# Patient Record
Sex: Female | Born: 1935
Health system: Southern US, Community
[De-identification: ages and names within clinical notes are randomized; demographics above are authoritative.]

## PROBLEM LIST (undated history)

## (undated) DIAGNOSIS — M810 Age-related osteoporosis without current pathological fracture: Secondary | ICD-10-CM

## (undated) DIAGNOSIS — K649 Unspecified hemorrhoids: Secondary | ICD-10-CM

## (undated) DIAGNOSIS — J449 Chronic obstructive pulmonary disease, unspecified: Secondary | ICD-10-CM

## (undated) DIAGNOSIS — M1990 Unspecified osteoarthritis, unspecified site: Secondary | ICD-10-CM

## (undated) DIAGNOSIS — Q782 Osteopetrosis: Secondary | ICD-10-CM

## (undated) DIAGNOSIS — C649 Malignant neoplasm of unspecified kidney, except renal pelvis: Secondary | ICD-10-CM

## (undated) DIAGNOSIS — B999 Unspecified infectious disease: Secondary | ICD-10-CM

## (undated) DIAGNOSIS — I10 Essential (primary) hypertension: Secondary | ICD-10-CM

## (undated) DIAGNOSIS — Z905 Acquired absence of kidney: Secondary | ICD-10-CM

## (undated) DIAGNOSIS — M4846XA Fatigue fracture of vertebra, lumbar region, initial encounter for fracture: Secondary | ICD-10-CM

## (undated) HISTORY — PX: EYE SURGERY: SHX253

## (undated) HISTORY — DX: Unspecified hemorrhoids: K64.9

## (undated) HISTORY — PX: ABDOMINAL HYSTERECTOMY: SHX81

## (undated) HISTORY — DX: Essential (primary) hypertension: I10

## (undated) HISTORY — PX: ABDOMINAL SURGERY: SHX537

## (undated) HISTORY — PX: INSERTION, VENA CAVA FILTER: SHX4416

---

## 2004-10-08 ENCOUNTER — Ambulatory Visit: Payer: Self-pay | Admitting: Gastroenterology

## 2004-10-23 ENCOUNTER — Ambulatory Visit: Payer: Self-pay | Admitting: Gastroenterology

## 2006-11-19 ENCOUNTER — Encounter: Admission: RE | Admit: 2006-11-19 | Discharge: 2006-11-19 | Payer: Self-pay | Admitting: Internal Medicine

## 2010-06-23 HISTORY — PX: ABDOMINAL SURGERY: SHX537

## 2010-06-23 HISTORY — PX: NEPHRECTOMY: SHX65

## 2010-08-02 ENCOUNTER — Other Ambulatory Visit: Payer: Self-pay | Admitting: Orthopaedic Surgery

## 2010-08-02 DIAGNOSIS — M545 Low back pain: Secondary | ICD-10-CM

## 2010-08-05 ENCOUNTER — Ambulatory Visit
Admission: RE | Admit: 2010-08-05 | Discharge: 2010-08-05 | Disposition: A | Discharge status: Home / Self Care | Payer: MEDICARE | Source: Ambulatory Visit | Attending: Orthopaedic Surgery | Admitting: Orthopaedic Surgery

## 2010-08-05 DIAGNOSIS — M545 Low back pain: Secondary | ICD-10-CM

## 2010-08-07 ENCOUNTER — Ambulatory Visit
Admission: RE | Admit: 2010-08-07 | Discharge: 2010-08-07 | Disposition: A | Discharge status: Home / Self Care | Payer: MEDICARE | Source: Ambulatory Visit | Attending: Orthopaedic Surgery | Admitting: Orthopaedic Surgery

## 2010-08-07 ENCOUNTER — Other Ambulatory Visit: Payer: Self-pay | Admitting: Orthopaedic Surgery

## 2010-08-07 DIAGNOSIS — N2881 Hypertrophy of kidney: Secondary | ICD-10-CM

## 2010-08-07 MED ORDER — IOHEXOL 300 MG/ML  SOLN
100.0000 mL | Freq: Once | INTRAMUSCULAR | Status: AC | PRN
  Administered 2010-08-07: 100 mL via INTRAVENOUS

## 2010-09-22 DIAGNOSIS — C649 Malignant neoplasm of unspecified kidney, except renal pelvis: Secondary | ICD-10-CM

## 2010-09-22 HISTORY — DX: Malignant neoplasm of unspecified kidney, except renal pelvis: C64.9

## 2010-12-16 ENCOUNTER — Other Ambulatory Visit: Payer: Self-pay | Admitting: Urology

## 2010-12-16 DIAGNOSIS — Z85528 Personal history of other malignant neoplasm of kidney: Secondary | ICD-10-CM

## 2010-12-17 ENCOUNTER — Ambulatory Visit
Admission: RE | Admit: 2010-12-17 | Discharge: 2010-12-17 | Disposition: A | Discharge status: Home / Self Care | Payer: Medicare Other | Source: Ambulatory Visit | Attending: Urology | Admitting: Urology

## 2010-12-17 DIAGNOSIS — Z85528 Personal history of other malignant neoplasm of kidney: Secondary | ICD-10-CM

## 2010-12-17 MED ORDER — IOHEXOL 300 MG/ML  SOLN
100.0000 mL | Freq: Once | INTRAMUSCULAR | Status: AC | PRN
  Administered 2010-12-17: 100 mL via INTRAVENOUS

## 2011-02-03 ENCOUNTER — Other Ambulatory Visit: Payer: Self-pay | Admitting: Urology

## 2011-02-03 DIAGNOSIS — Z8051 Family history of malignant neoplasm of kidney: Secondary | ICD-10-CM

## 2011-02-12 ENCOUNTER — Other Ambulatory Visit: Payer: Self-pay | Admitting: Urology

## 2011-02-12 DIAGNOSIS — Z8051 Family history of malignant neoplasm of kidney: Secondary | ICD-10-CM

## 2011-05-21 ENCOUNTER — Emergency Department (HOSPITAL_COMMUNITY): Payer: Medicare Other

## 2011-05-21 ENCOUNTER — Encounter (HOSPITAL_COMMUNITY): Payer: Self-pay | Admitting: Emergency Medicine

## 2011-05-21 ENCOUNTER — Emergency Department (HOSPITAL_COMMUNITY)
Admission: EM | Admit: 2011-05-21 | Discharge: 2011-05-21 | Disposition: A | Discharge status: Home / Self Care | Payer: Medicare Other | Attending: Emergency Medicine | Admitting: Emergency Medicine

## 2011-05-21 DIAGNOSIS — R109 Unspecified abdominal pain: Secondary | ICD-10-CM | POA: Insufficient documentation

## 2011-05-21 DIAGNOSIS — M810 Age-related osteoporosis without current pathological fracture: Secondary | ICD-10-CM | POA: Insufficient documentation

## 2011-05-21 DIAGNOSIS — R110 Nausea: Secondary | ICD-10-CM | POA: Insufficient documentation

## 2011-05-21 DIAGNOSIS — Z8553 Personal history of malignant neoplasm of renal pelvis: Secondary | ICD-10-CM | POA: Insufficient documentation

## 2011-05-21 DIAGNOSIS — R197 Diarrhea, unspecified: Secondary | ICD-10-CM | POA: Insufficient documentation

## 2011-05-21 HISTORY — DX: Acquired absence of kidney: Z90.5

## 2011-05-21 HISTORY — DX: Osteopetrosis: Q78.2

## 2011-05-21 HISTORY — DX: Malignant neoplasm of unspecified kidney, except renal pelvis: C64.9

## 2011-05-21 LAB — POCT I-STAT, CHEM 8
BUN: 10 mg/dL (ref 6–23)
Calcium, Ion: 1.13 mmol/L (ref 1.12–1.32)
Creatinine, Ser: 0.9 mg/dL (ref 0.50–1.10)
Glucose, Bld: 92 mg/dL (ref 70–99)
TCO2: 23 mmol/L (ref 0–100)

## 2011-05-21 MED ORDER — ONDANSETRON HCL 4 MG/2ML IJ SOLN
4.0000 mg | Freq: Once | INTRAMUSCULAR | Status: AC
  Administered 2011-05-21: 4 mg via INTRAVENOUS

## 2011-05-21 MED ORDER — IOHEXOL 300 MG/ML  SOLN
100.0000 mL | Freq: Once | INTRAMUSCULAR | Status: AC | PRN
  Administered 2011-05-21: 100 mL via INTRAVENOUS

## 2011-05-21 MED ORDER — ONDANSETRON HCL 4 MG/2ML IJ SOLN
INTRAMUSCULAR | Status: AC
  Filled 2011-05-21: qty 2

## 2011-05-21 MED ORDER — SODIUM CHLORIDE 0.9 % IV BOLUS (SEPSIS)
1000.0000 mL | Freq: Once | INTRAVENOUS | Status: AC
  Administered 2011-05-21: 1000 mL via INTRAVENOUS

## 2011-05-21 NOTE — ED Notes (Signed)
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

## 2011-05-21 NOTE — ED Provider Notes (Signed)
Pt CT scan did not show any acute process. IT did show a 1.1cm lymph node amongst some other enlarged lymph nodes. Pt has a history of renal cell carcinoma. I have made pt aware or these findings and she has agreed to call her doctor, DR. Evans who is at [facility] for follow up of these findings. Pt is comfortable and agreeable  to be discharged. Has not had any pain since in ED or vomiting  Dorthula Matas, PA 05/21/11 1932  Dorthula Matas, PA 06/14/11 [phone]

## 2011-05-21 NOTE — ED Notes (Signed)
Pt resting comfortably talking on the phone. Ct has been called to check on delay in getting pt scan done.they advise she is in the box awaiting transport. Pt is aware and has been updated

## 2011-05-21 NOTE — ED Provider Notes (Signed)
History     CSN: [id number] Arrival date & time: 05/21/2011 12:17 PM   First MD Initiated Contact with Patient 05/21/11 1234      Chief Complaint  Patient presents with  . Abdominal Pain     Patient is a 77 y.o. female presenting with abdominal pain. The history is provided by the patient.  Abdominal Pain The primary symptoms of the illness include abdominal pain, nausea and diarrhea. The primary symptoms of the illness do not include fever. The current episode started more than 2 days ago. The onset of the illness was gradual. The problem has been gradually worsening.  Symptoms associated with the illness do not include back pain.  she has no CP at this time  Past Medical History  Diagnosis Date  . Cancer   . Osteopetrosis     Past Surgical History  Procedure Date  . Abdominal surgery     History reviewed. No pertinent family history.  History  Substance Use Topics  . Smoking status: Former Games developer  . Smokeless tobacco: Not on file  . Alcohol Use: 4.2 oz/week    7 Glasses of wine per week    OB History    Grav Para Term Preterm Abortions TAB SAB Ect Mult Living                  Review of Systems  Constitutional: Negative for fever.  Gastrointestinal: Positive for nausea, abdominal pain and diarrhea.  Musculoskeletal: Negative for back pain.  All other systems reviewed and are negative.    Allergies  Review of patient's allergies indicates no known allergies.  Home Medications   Current Outpatient Rx  Name Route Sig Dispense Refill  . ASPIRIN-ACETAMINOPHEN-CAFFEINE 250-250-65 MG PO TABS Oral Take 2 tablets by mouth daily as needed. For headache     . FISH OIL BURP-LESS 500 MG PO CAPS Oral Take 1,500 mg by mouth daily.      Marland Kitchen VITAMIN D 1000 UNITS PO TABS Oral Take 1,000 Units by mouth daily.        BP 156/73  Pulse 67  Temp(Src) 97.6 F (36.4 C) (Oral)  Resp 16  SpO2 100%  Physical Exam  CONSTITUTIONAL: Well developed/well nourished HEAD AND  FACE: Normocephalic/atraumatic EYES: EOMI/PERRL ENMT: Mucous membranes moist NECK: supple no meningeal signs SPINE:entire spine nontender CV: S1/S2 noted, no murmurs/rubs/gallops noted LUNGS: Lungs are clear to auscultation bilaterally, no apparent distress ABDOMEN: soft, diffuse tenderness but no rebound/guarding, +Bs noted, midline scar noted GU:no cva tenderness NEURO: Pt is awake/alert, moves all extremitiesx4 EXTREMITIES: pulses normal, full ROM SKIN: warm, color normal PSYCH: no abnormalities of mood noted   ED Course  Procedures   Labs Reviewed  I-STAT, CHEM 8    1:55 PM Pt with abd pain, h/o renal cell CA Will get CT abd/pelvis Pt otherwise stable . If negative can be discharged D/w PA [place] and Neva Seat, will place in CDU hold She will get CT chest as outpatient for CA surveillance She does not want EKG at this time   MDM  Nursing notes reviewed and considered in documentation All labs/vitals reviewed and considered         Joya Gaskins, MD 05/21/11 1655

## 2011-05-21 NOTE — ED Notes (Signed)
NAD, resp e/u, pt states understanding of discharge instructions and denies questions, pt ambulatory with steady gait at time of discharge.

## 2011-06-02 ENCOUNTER — Other Ambulatory Visit: Payer: Medicare Other

## 2011-06-19 NOTE — ED Provider Notes (Signed)
Medical screening examination/treatment/procedure(s) were conducted as a shared visit with non-physician practitioner(s) and myself.  I personally evaluated the patient during the encounter   Joya Gaskins, MD 06/19/11 1258

## 2011-09-24 ENCOUNTER — Other Ambulatory Visit (HOSPITAL_COMMUNITY): Payer: Self-pay | Admitting: Urology

## 2011-09-29 ENCOUNTER — Encounter (HOSPITAL_COMMUNITY): Payer: Self-pay | Admitting: Pharmacy Technician

## 2011-09-30 ENCOUNTER — Other Ambulatory Visit: Payer: Self-pay | Admitting: Radiology

## 2011-10-03 ENCOUNTER — Other Ambulatory Visit: Payer: Self-pay | Admitting: Radiology

## 2011-10-09 ENCOUNTER — Encounter (HOSPITAL_COMMUNITY): Payer: Self-pay

## 2011-10-09 ENCOUNTER — Ambulatory Visit (HOSPITAL_COMMUNITY)
Admission: RE | Admit: 2011-10-09 | Discharge: 2011-10-09 | Disposition: A | Discharge status: Home / Self Care | Payer: Medicare Other | Source: Ambulatory Visit | Attending: Urology | Admitting: Urology

## 2011-10-09 ENCOUNTER — Other Ambulatory Visit (HOSPITAL_COMMUNITY): Payer: Self-pay | Admitting: Urology

## 2011-10-09 DIAGNOSIS — Z538 Procedure and treatment not carried out for other reasons: Secondary | ICD-10-CM | POA: Insufficient documentation

## 2011-10-09 DIAGNOSIS — R590 Localized enlarged lymph nodes: Secondary | ICD-10-CM

## 2011-10-09 DIAGNOSIS — R1900 Intra-abdominal and pelvic swelling, mass and lump, unspecified site: Secondary | ICD-10-CM | POA: Insufficient documentation

## 2011-10-09 LAB — CBC
MCHC: 36.4 g/dL — ABNORMAL HIGH (ref 30.0–36.0)
RBC: 4.86 MIL/uL (ref 3.87–5.11)
RDW: 12.7 % (ref 11.5–15.5)
WBC: 5.9 10*3/uL (ref 4.0–10.5)

## 2011-10-09 LAB — PROTIME-INR
INR: 1.02 (ref 0.00–1.49)
Prothrombin Time: 13.6 seconds (ref 11.6–15.2)

## 2011-10-09 MED ORDER — SODIUM CHLORIDE 0.9 % IV SOLN
Freq: Once | INTRAVENOUS | Status: AC
  Administered 2011-10-09: 1000 mL via INTRAVENOUS

## 2011-10-09 MED ORDER — MIDAZOLAM HCL 2 MG/2ML IJ SOLN
INTRAMUSCULAR | Status: AC
  Filled 2011-10-09: qty 4

## 2011-10-09 MED ORDER — FENTANYL CITRATE 0.05 MG/ML IJ SOLN
INTRAMUSCULAR | Status: AC
  Filled 2011-10-09: qty 4

## 2011-10-09 MED ORDER — IOHEXOL 300 MG/ML  SOLN: 80.0000 mL | Freq: Once | INTRAMUSCULAR | Status: AC | PRN

## 2011-10-09 NOTE — H&P (Signed)
Nicole Preston is an 78 y.o. female.   Chief Complaint: Mesenteric/retroperitoneal lymphadenopathy. Hx of renal cell cancer  HPI: Pt with prior hx of renal cell cancer and (R)nephrectomy. Has been followed for recurrent intra-abd/retroperitoneal lymphadeopathy by Dr. Logan Bores. Last CT was November but LN too small for biopsy. Pt now scheduled today for repeat CT and possible biopsy if amenable.  Past Medical History  Diagnosis Date  . Cancer   . Osteopetrosis   . Renal cancer   . S/p nephrectomy     Past Surgical History  Procedure Date  . (R)nephrectomy     History reviewed. No pertinent family history. Social History:  reports that she has quit smoking. She does not have any smokeless tobacco history on file. She reports that she drinks about 4.2 ounces of alcohol per week. She reports that she does not use illicit drugs.  Allergies: No Known Allergies  Medications Prior to Admission  Medication Sig Dispense Refill  . cholecalciferol (VITAMIN D) 1000 UNITS tablet Take 1,000 Units by mouth daily.        Marland Kitchen losartan-hydrochlorothiazide (HYZAAR) 100-12.5 MG per tablet Take 1 tablet by mouth at bedtime.      . Omega-3 Fatty Acids (FISH OIL BURP-LESS) 500 MG CAPS Take 1,500 mg by mouth daily.        Marland Kitchen aspirin-acetaminophen-caffeine (EXCEDRIN MIGRAINE) 250-250-65 MG per tablet Take 2 tablets by mouth daily as needed. For headache        Medications Prior to Admission  Medication Dose Route Frequency Provider Last Rate Last Dose  . 0.9 %  sodium chloride infusion   Intravenous Once Robet Leu, PA 20 mL/hr at 10/09/11 0813 1,000 mL at 10/09/11 0813    No results found for this or any previous visit (from the past 48 hour(s)). No results found.  Review of Systems  Constitutional: Positive for malaise/fatigue. Negative for fever, chills and weight loss.  Respiratory: Negative for cough, hemoptysis and shortness of breath.   Cardiovascular: Negative for chest pain, palpitations and  claudication.  Gastrointestinal: Negative for nausea, vomiting, abdominal pain, diarrhea and constipation.  Genitourinary: Negative for dysuria, urgency and frequency.    Blood pressure 157/78, pulse 73, temperature 97 F (36.1 C), temperature source Oral, resp. rate 18, height 5\' 5"  (1.651 m), weight 138 lb (62.596 kg), SpO2 97.00%. Physical Exam  Constitutional: She is oriented to person, place, and time. She appears well-developed and well-nourished. No distress.  HENT:  Head: Normocephalic.  Neck: Normal range of motion. Neck supple. No JVD present. No tracheal deviation present.  Cardiovascular: Normal rate, regular rhythm and normal heart sounds.   No murmur heard. Respiratory: Effort normal and breath sounds normal. No respiratory distress. She has no wheezes.  GI: Soft. Bowel sounds are normal. She exhibits no distension. There is no tenderness.  Neurological: She is alert and oriented to person, place, and time.  Psychiatric: She has a normal mood and affect. Judgment normal.     Assessment/Plan Hx of renal cell cancer Mesenteric/retroperitonela lymphadenopathy. For CT today with possible biopsy if there is a LN accessible. Procedure, including risks, complications explained to pt and husband. Consent signed in chart.  Allayne Butcher 10/09/2011, 8:33 AM

## 2011-10-13 ENCOUNTER — Other Ambulatory Visit: Payer: Self-pay | Admitting: Urology

## 2011-10-13 DIAGNOSIS — Z8051 Family history of malignant neoplasm of kidney: Secondary | ICD-10-CM

## 2011-11-06 ENCOUNTER — Encounter: Payer: Self-pay | Admitting: Gastroenterology

## 2011-12-09 ENCOUNTER — Ambulatory Visit (INDEPENDENT_AMBULATORY_CARE_PROVIDER_SITE_OTHER): Payer: Medicare Other | Admitting: Gastroenterology

## 2011-12-09 ENCOUNTER — Encounter: Payer: Self-pay | Admitting: Gastroenterology

## 2011-12-09 VITALS — BP 138/64 | HR 60 | Ht 65.0 in | Wt 141.0 lb

## 2011-12-09 DIAGNOSIS — K625 Hemorrhage of anus and rectum: Secondary | ICD-10-CM

## 2011-12-09 DIAGNOSIS — C649 Malignant neoplasm of unspecified kidney, except renal pelvis: Secondary | ICD-10-CM

## 2011-12-09 MED ORDER — MOVIPREP 100 G PO SOLR: 1.0000 | Freq: Once | ORAL | Status: DC

## 2011-12-09 MED ORDER — HYDROCORTISONE ACETATE 25 MG RE SUPP: 25.0000 mg | Freq: Two times a day (BID) | RECTAL | Status: AC

## 2011-12-09 NOTE — Progress Notes (Signed)
History of Present Illness: Nicole Preston is a pleasant 78-year-old white female referred at the request of Dr. Tisovec for evaluation of rectal bleeding. For the last 2-3 weeks she's been complaining of frequent blood per rectum consisting of blood mixed with her stools. She has passed clots. She's had loose stools, which is her norm, and some rectal soreness. Colonoscopy in 2006 demonstrated diverticulosis and internal  Hemorrhoids.  Approximately 14 months ago she underwent nephrectomy for a carcinoma. CT scan in April, 2013 did not demonstrate recurrence.   Past Medical History  Diagnosis Date  . Osteopetrosis   . Renal cancer   . S/p nephrectomy   . Hemorrhoids   . Hypertension    Past Surgical History  Procedure Date  . Abdominal surgery   . Abdominal hysterectomy    family history includes Kidney cancer in her mother.  There is no history of Colon cancer. Current Outpatient Prescriptions  Medication Sig Dispense Refill  . aspirin-acetaminophen-caffeine (EXCEDRIN MIGRAINE) 250-250-65 MG per tablet Take 2 tablets by mouth daily as needed. For headache       . Bismuth Subsalicylate (PEPTO-BISMOL PO) Take by mouth as directed.      . cholecalciferol (VITAMIN D) 1000 UNITS tablet Take 1,000 Units by mouth daily.        . losartan-hydrochlorothiazide (HYZAAR) 100-12.5 MG per tablet Take by mouth as directed.       . Omega-3 Fatty Acids (FISH OIL BURP-LESS) 500 MG CAPS Take 1,500 mg by mouth daily.         Allergies as of 12/09/2011  . (No Known Allergies)    reports that she has quit smoking. She has never used smokeless tobacco. She reports that she drinks about 1.2 - 1.8 ounces of alcohol per week. She reports that she does not use illicit drugs.     Review of Systems:  to low back painPertinent positive and negative review of systems were noted in the above HPI section. All other review of systems were otherwise negative.  Vital signs were reviewed in today's medical  record Physical Exam: General: Well developed , well nourished, no acute distress Head: Normocephalic and atraumatic Eyes:  sclerae anicteric, EOMI Ears: Normal auditory acuity Mouth: No deformity or lesions Neck: Supple, no masses or thyromegaly Lungs: Clear throughout to auscultation Heart: Regular rate and rhythm; no murmurs, rubs or bruits Abdomen: Soft, non tender and non distended. No masses, hepatosplenomegaly or hernias noted. Normal Bowel sounds Rectal:A single grade 3 hemorrhoid is present  Musculoskeletal: Symmetrical with no gross deformities  Skin: No lesions on visible extremities Pulses:  Normal pulses noted Extremities: No clubbing, cyanosis, edema or deformities noted Neurological: Alert oriented x 4, grossly nonfocal Cervical Nodes:  No significant cervical adenopathy Inguinal Nodes: No significant inguinal adenopathy Psychological:  Alert and cooperative. Normal mood and affect       

## 2011-12-09 NOTE — Assessment & Plan Note (Addendum)
Rectal bleeding may be due to hemorrhoids. A more proximal colonic bleeding source should be ruled out.  Recommendations #1 Anusol HC suppositories 11 #2 colonoscopy #3 if bleeding persists and colonoscopy does not demonstrate a bleeding source I will proceed with band ligation

## 2011-12-10 ENCOUNTER — Telehealth: Payer: Self-pay | Admitting: Gastroenterology

## 2011-12-11 NOTE — Telephone Encounter (Signed)
Mailed patient a coupon for the MoviPrep today, Patient aware

## 2011-12-29 ENCOUNTER — Ambulatory Visit (HOSPITAL_COMMUNITY)
Admission: RE | Admit: 2011-12-29 | Discharge: 2011-12-29 | Disposition: A | Discharge status: Home / Self Care | Payer: Medicare Other | Source: Ambulatory Visit | Attending: Gastroenterology | Admitting: Gastroenterology

## 2011-12-29 ENCOUNTER — Encounter (HOSPITAL_COMMUNITY): Payer: Self-pay

## 2011-12-29 ENCOUNTER — Telehealth: Payer: Self-pay

## 2011-12-29 ENCOUNTER — Encounter (HOSPITAL_COMMUNITY)
Admission: RE | Disposition: A | Discharge status: Home / Self Care | Payer: Self-pay | Source: Ambulatory Visit | Attending: Gastroenterology

## 2011-12-29 DIAGNOSIS — Z87891 Personal history of nicotine dependence: Secondary | ICD-10-CM | POA: Insufficient documentation

## 2011-12-29 DIAGNOSIS — M810 Age-related osteoporosis without current pathological fracture: Secondary | ICD-10-CM | POA: Insufficient documentation

## 2011-12-29 DIAGNOSIS — Z905 Acquired absence of kidney: Secondary | ICD-10-CM | POA: Insufficient documentation

## 2011-12-29 DIAGNOSIS — Z5309 Procedure and treatment not carried out because of other contraindication: Secondary | ICD-10-CM | POA: Insufficient documentation

## 2011-12-29 DIAGNOSIS — Z79899 Other long term (current) drug therapy: Secondary | ICD-10-CM | POA: Insufficient documentation

## 2011-12-29 DIAGNOSIS — Z85528 Personal history of other malignant neoplasm of kidney: Secondary | ICD-10-CM | POA: Insufficient documentation

## 2011-12-29 DIAGNOSIS — I10 Essential (primary) hypertension: Secondary | ICD-10-CM | POA: Insufficient documentation

## 2011-12-29 DIAGNOSIS — K648 Other hemorrhoids: Secondary | ICD-10-CM | POA: Insufficient documentation

## 2011-12-29 DIAGNOSIS — K625 Hemorrhage of anus and rectum: Secondary | ICD-10-CM | POA: Insufficient documentation

## 2011-12-29 HISTORY — PX: COLONOSCOPY: SHX5424

## 2011-12-29 SURGERY — COLONOSCOPY
Anesthesia: Moderate Sedation

## 2011-12-29 MED ORDER — MIDAZOLAM HCL 10 MG/2ML IJ SOLN
INTRAMUSCULAR | Status: AC
  Filled 2011-12-29: qty 4

## 2011-12-29 MED ORDER — FENTANYL CITRATE 0.05 MG/ML IJ SOLN
INTRAMUSCULAR | Status: DC | PRN
  Administered 2011-12-29 (×4): 25 ug via INTRAVENOUS

## 2011-12-29 MED ORDER — DIPHENHYDRAMINE HCL 50 MG/ML IJ SOLN
INTRAMUSCULAR | Status: DC | PRN
  Administered 2011-12-29: 25 mg via INTRAVENOUS

## 2011-12-29 MED ORDER — FENTANYL CITRATE 0.05 MG/ML IJ SOLN
INTRAMUSCULAR | Status: AC
  Filled 2011-12-29: qty 4

## 2011-12-29 MED ORDER — DIPHENHYDRAMINE HCL 50 MG/ML IJ SOLN
INTRAMUSCULAR | Status: AC
  Filled 2011-12-29: qty 1

## 2011-12-29 MED ORDER — MIDAZOLAM HCL 5 MG/5ML IJ SOLN
INTRAMUSCULAR | Status: DC | PRN
  Administered 2011-12-29 (×2): 2 mg via INTRAVENOUS
  Administered 2011-12-29: 1 mg via INTRAVENOUS
  Administered 2011-12-29: 2 mg via INTRAVENOUS
  Administered 2011-12-29 (×2): 1 mg via INTRAVENOUS

## 2011-12-29 NOTE — Interval H&P Note (Signed)
History and Physical Interval Note:  12/29/2011 12:14 PM  Nicole Preston  has presented today for surgery, with the diagnosis of hemorrhoids  The various methods of treatment have been discussed with the patient and family. After consideration of risks, benefits and other options for treatment, the patient has consented to  Procedure(s) (LRB): COLONOSCOPY (N/A) HEMORRHOID BANDING (N/A) as a surgical intervention .  The patient's history has been reviewed, patient examined, no change in status, stable for surgery.  I have reviewed the patients' chart and labs.  Questions were answered to the patient's satisfaction.    The recent H&P (dated *12/09/11**) was reviewed, the patient was examined and there is no change in the patients condition since that H&P was completed.   Nicole Preston  12/29/2011, 12:14 PM    Nicole Preston

## 2011-12-29 NOTE — Telephone Encounter (Signed)
Pt scheduled for colon in the [facility] 12/30/11 arrival time 12:30 for a 1:30pm appt. Spoke with nurse taking care of her in the endo unit. She will notify pt of appt date and time. Pt to remain on clear liquids.

## 2011-12-29 NOTE — Progress Notes (Signed)
Pt's husband called to inquire about his wife's cancelled procedure today.  He informed that he was told that she could not be sedated properly for the case and he wanted to "know why the case was scheduled there in the first place".  Informed pt's husband that the decision was made by Dr. Arlyce Dice and he would need to discuss with Dr. Arlyce Dice. Pt's husband also informed that he was disappointed that his wife had to wait another day to have the procedure done and continue to be NPO until the pts scheduled colonoscopy appt at [facility] on 12/30/2011 @1230 .  At this time, he inquired if the appt could be rescheduled to earlier in the day.  Informed pt's husband that he would need to speak to someone in Dr. Marzetta Board office.  Informed pt that I would call Selinda Michaels at [facility] re: his request and ask her to f/u and give him a call.  F/U with Bonita Quin.  Bonita Quin informed there were no other available appts and that she will call pt's husband and inform.

## 2011-12-29 NOTE — H&P (View-Only) (Signed)
History of Present Illness: Nicole Preston is a pleasant 78 year old white female referred at the request of Dr. Wylene Simmer for evaluation of rectal bleeding. For the last 2-3 weeks she's been complaining of frequent blood per rectum consisting of blood mixed with her stools. She has passed clots. She's had loose stools, which is her norm, and some rectal soreness. Colonoscopy in 2006 demonstrated diverticulosis and internal  Hemorrhoids.  Approximately 14 months ago she underwent nephrectomy for a carcinoma. CT scan in April, 2013 did not demonstrate recurrence.   Past Medical History  Diagnosis Date  . Osteopetrosis   . Renal cancer   . S/p nephrectomy   . Hemorrhoids   . Hypertension    Past Surgical History  Procedure Date  . Abdominal surgery   . Abdominal hysterectomy    family history includes Kidney cancer in her mother.  There is no history of Colon cancer. Current Outpatient Prescriptions  Medication Sig Dispense Refill  . aspirin-acetaminophen-caffeine (EXCEDRIN MIGRAINE) 250-250-65 MG per tablet Take 2 tablets by mouth daily as needed. For headache       . Bismuth Subsalicylate (PEPTO-BISMOL PO) Take by mouth as directed.      . cholecalciferol (VITAMIN D) 1000 UNITS tablet Take 1,000 Units by mouth daily.        Marland Kitchen losartan-hydrochlorothiazide (HYZAAR) 100-12.5 MG per tablet Take by mouth as directed.       . Omega-3 Fatty Acids (FISH OIL BURP-LESS) 500 MG CAPS Take 1,500 mg by mouth daily.         Allergies as of 12/09/2011  . (No Known Allergies)    reports that she has quit smoking. She has never used smokeless tobacco. She reports that she drinks about 1.2 - 1.8 ounces of alcohol per week. She reports that she does not use illicit drugs.     Review of Systems:  to low back painPertinent positive and negative review of systems were noted in the above HPI section. All other review of systems were otherwise negative.  Vital signs were reviewed in today's medical  record Physical Exam: General: Well developed , well nourished, no acute distress Head: Normocephalic and atraumatic Eyes:  sclerae anicteric, EOMI Ears: Normal auditory acuity Mouth: No deformity or lesions Neck: Supple, no masses or thyromegaly Lungs: Clear throughout to auscultation Heart: Regular rate and rhythm; no murmurs, rubs or bruits Abdomen: Soft, non tender and non distended. No masses, hepatosplenomegaly or hernias noted. Normal Bowel sounds Rectal:A single grade 3 hemorrhoid is present  Musculoskeletal: Symmetrical with no gross deformities  Skin: No lesions on visible extremities Pulses:  Normal pulses noted Extremities: No clubbing, cyanosis, edema or deformities noted Neurological: Alert oriented x 4, grossly nonfocal Cervical Nodes:  No significant cervical adenopathy Inguinal Nodes: No significant inguinal adenopathy Psychological:  Alert and cooperative. Normal mood and affect

## 2011-12-29 NOTE — Op Note (Signed)
[facility] [address] [place], [place]  [address]  COLONOSCOPY PROCEDURE REPORT  PATIENT:  Nicole Preston, Nicole Preston  MR#:  [id number] BIRTHDATE:  [birth year], 78 yrs. old  GENDER:  female ENDOSCOPIST:  Barbette Hair. Arlyce Dice, MD REF. BY:  Guerry Bruin, M.D. PROCEDURE DATE:  12/29/2011 PROCEDURE:  Incomplete colonoscopy ASA CLASS:  Class II INDICATIONS:  rectal bleeding MEDICATIONS:   These medications were titrated to patient response per physician's verbal order, Fentanyl 100 mcg IV, Versed 9 mg IV, Benadryl 25 mg IV  DESCRIPTION OF PROCEDURE:   After the risks benefits and alternatives of the procedure were thoroughly explained, informed consent was obtained.  Digital rectal exam was performed and revealed no abnormalities.   The Pentax Colonoscope [id number] endoscope was introduced through the anus and advanced to the descending colon, limited by extreme patient discomfort.  Unable to obtain a colonoscopy secondary to in adequate sedation  The quality of the prep was excellent, using MiraLax.  The instrument was then slowly withdrawn as the colon was fully examined. <<PROCEDUREIMAGES>>  FINDINGS:  Internal Hemorrhoids were found (see image1).  This was otherwise a normal examination of the colon.   Retroflexed views in the rectum revealed no abnormalities.    The time to cecum = minutes. The scope was then withdrawn in  minutes from the cecum and the procedure completed. COMPLICATIONS:  None ENDOSCOPIC IMPRESSION: 1) Internal hemorrhoids 2) Otherwise normal examination RECOMMENDATIONS: Colonoscopy on 12/30/2011 with propofol  REPEAT EXAM:  As above  ______________________________ Barbette Hair. Arlyce Dice, MD  CC:  n. eSIGNED:   Barbette Hair. Kaplan at 12/29/2011 01:08 PM  Ezzard Flax, [phone]

## 2011-12-29 NOTE — Telephone Encounter (Signed)
Message copied by Chrystie Nose on Mon Dec 29, 2011  1:19 PM ------      Message from: Melvia Heaps D      Created: Mon Dec 29, 2011  1:03 PM       Unable to complete colonoscopy because of sedation issues. We have a cancellation for tomorrow. Please schedule her for colonoscopy at the [facility]. for tomorrow. Patient is currently at [facility] long endoscopy.

## 2011-12-30 ENCOUNTER — Emergency Department (HOSPITAL_COMMUNITY)
Admission: EM | Admit: 2011-12-30 | Discharge: 2011-12-31 | Disposition: A | Discharge status: Home / Self Care | Payer: Medicare Other | Attending: Emergency Medicine | Admitting: Emergency Medicine

## 2011-12-30 ENCOUNTER — Emergency Department (HOSPITAL_COMMUNITY): Payer: Medicare Other

## 2011-12-30 ENCOUNTER — Ambulatory Visit (AMBULATORY_SURGERY_CENTER): Payer: Medicare Other | Admitting: Gastroenterology

## 2011-12-30 ENCOUNTER — Encounter (HOSPITAL_COMMUNITY): Payer: Self-pay | Admitting: *Deleted

## 2011-12-30 ENCOUNTER — Encounter: Payer: Self-pay | Admitting: Gastroenterology

## 2011-12-30 VITALS — HR 70 | Temp 97.0°F | Resp 24 | Ht 65.0 in | Wt 141.0 lb

## 2011-12-30 DIAGNOSIS — R109 Unspecified abdominal pain: Secondary | ICD-10-CM

## 2011-12-30 DIAGNOSIS — N390 Urinary tract infection, site not specified: Secondary | ICD-10-CM

## 2011-12-30 DIAGNOSIS — Z9889 Other specified postprocedural states: Secondary | ICD-10-CM | POA: Insufficient documentation

## 2011-12-30 DIAGNOSIS — I700 Atherosclerosis of aorta: Secondary | ICD-10-CM | POA: Insufficient documentation

## 2011-12-30 DIAGNOSIS — K625 Hemorrhage of anus and rectum: Secondary | ICD-10-CM

## 2011-12-30 DIAGNOSIS — D126 Benign neoplasm of colon, unspecified: Secondary | ICD-10-CM

## 2011-12-30 DIAGNOSIS — E119 Type 2 diabetes mellitus without complications: Secondary | ICD-10-CM | POA: Insufficient documentation

## 2011-12-30 DIAGNOSIS — D72829 Elevated white blood cell count, unspecified: Secondary | ICD-10-CM | POA: Insufficient documentation

## 2011-12-30 DIAGNOSIS — K5730 Diverticulosis of large intestine without perforation or abscess without bleeding: Secondary | ICD-10-CM | POA: Insufficient documentation

## 2011-12-30 DIAGNOSIS — Z8553 Personal history of malignant neoplasm of renal pelvis: Secondary | ICD-10-CM | POA: Insufficient documentation

## 2011-12-30 DIAGNOSIS — E871 Hypo-osmolality and hyponatremia: Secondary | ICD-10-CM | POA: Insufficient documentation

## 2011-12-30 DIAGNOSIS — R1032 Left lower quadrant pain: Secondary | ICD-10-CM | POA: Insufficient documentation

## 2011-12-30 DIAGNOSIS — I10 Essential (primary) hypertension: Secondary | ICD-10-CM | POA: Insufficient documentation

## 2011-12-30 DIAGNOSIS — K648 Other hemorrhoids: Secondary | ICD-10-CM

## 2011-12-30 LAB — COMPREHENSIVE METABOLIC PANEL
Alkaline Phosphatase: 66 U/L (ref 39–117)
BUN: 10 mg/dL (ref 6–23)
Creatinine, Ser: 0.91 mg/dL (ref 0.50–1.10)
GFR calc Af Amer: 69 mL/min — ABNORMAL LOW (ref >90)
Glucose, Bld: 107 mg/dL — ABNORMAL HIGH (ref 70–99)
Potassium: 3.7 mEq/L (ref 3.5–5.1)
Total Protein: 7.5 g/dL (ref 6.0–8.3)

## 2011-12-30 LAB — CBC WITH DIFFERENTIAL/PLATELET
Eosinophils Absolute: 0.1 10*3/uL (ref 0.0–0.7)
Eosinophils Relative: 0 % (ref 0–5)
HCT: 37.8 % (ref 36.0–46.0)
Hemoglobin: 13.2 g/dL (ref 12.0–15.0)
Lymphs Abs: 1.1 10*3/uL (ref 0.7–4.0)
MCH: 30.6 pg (ref 26.0–34.0)
MCHC: 34.9 g/dL (ref 30.0–36.0)
MCV: 87.5 fL (ref 78.0–100.0)
Monocytes Absolute: 1 10*3/uL (ref 0.1–1.0)
Monocytes Relative: 7 % (ref 3–12)
Neutrophils Relative %: 85 % — ABNORMAL HIGH (ref 43–77)
RBC: 4.32 MIL/uL (ref 3.87–5.11)

## 2011-12-30 MED ORDER — HYDROCORTISONE ACETATE 25 MG RE SUPP: 25.0000 mg | Freq: Two times a day (BID) | RECTAL | Status: AC

## 2011-12-30 MED ORDER — ONDANSETRON HCL 4 MG/2ML IJ SOLN
4.0000 mg | Freq: Once | INTRAMUSCULAR | Status: AC
  Administered 2011-12-30: 4 mg via INTRAVENOUS
  Filled 2011-12-30: qty 2

## 2011-12-30 MED ORDER — MORPHINE SULFATE 4 MG/ML IJ SOLN
4.0000 mg | Freq: Once | INTRAMUSCULAR | Status: AC
  Administered 2011-12-30: 4 mg via INTRAVENOUS
  Filled 2011-12-30: qty 1

## 2011-12-30 MED ORDER — SODIUM CHLORIDE 0.9 % IV SOLN: 500.0000 mL | INTRAVENOUS | Status: DC

## 2011-12-30 NOTE — ED Notes (Signed)
MD at bedside.  EDP Miller present to evaluate this pt 

## 2011-12-30 NOTE — Op Note (Signed)
[facility] [address] [place], [place]  [address]  COLONOSCOPY PROCEDURE REPORT  PATIENT:  Nicole Preston, Nicole Preston  MR#:  [id number] BIRTHDATE:  Jun 13, 1934, 78 yrs. old  GENDER:  female ENDOSCOPIST:  Barbette Hair. Arlyce Dice, MD REF. BY:  Guerry Bruin, M.D. PROCEDURE DATE:  12/30/2011 PROCEDURE:  Colonoscopy with snare polypectomy ASA CLASS:  Class II INDICATIONS:  rectal bleeding MEDICATIONS:   MAC sedation, administered by CRNA propofol 300mg IV  DESCRIPTION OF PROCEDURE:   After the risks benefits and alternatives of the procedure were thoroughly explained, informed consent was obtained.  Digital rectal exam was performed and revealed prolasped hemorrhoids.   The LB [id number] endoscope was introduced through the anus and advanced to the cecum, which was identified by both the appendix and ileocecal valve, without limitations.  The quality of the prep was good, using MiraLax.  The instrument was then slowly withdrawn as the colon was fully examined. <<PROCEDUREIMAGES>>  FINDINGS:  A sessile polyp was found in the descending colon. It was 10 mm in size. Polyp was snared without cautery. Retrieval was successful (see image10). snare polyp  other finding. Acute angulation and stiffness at sigmoid curve. Scope passed with difficulty.  Internal Hemorrhoids were found (see image11).  This was otherwise a normal examination of the colon (see image2 and image3).   Retroflexed views in the rectum revealed no abnormalities.    The time to cecum =  1) 5.75  minutes. The scope was then withdrawn in  1) 11.50  minutes from the cecum and the procedure completed. COMPLICATIONS:  None ENDOSCOPIC IMPRESSION: 1) 10 mm sessile polyp in the descending colon 2) Other finding 3) Internal hemorrhoids 4) Otherwise normal examination RECOMMENDATIONS: 1) High fiber diet. 2) If the polyp(s) removed today are proven to be adenomatous (pre-cancerous) polyps, you will need a repeat colonoscopy  in 5 years. Otherwise you should continue to follow colorectal cancer screening guidelines for "routine risk" patients with colonoscopy in 10 years. You will receive a letter within 1-2 weeks with the results of your biopsy as well as final recommendations. Please call my office if you have not received a letter after 3 weeks. 3) Anusol HC supp. prn 4) Call endoscopist office to schedule a follow-up appointment in 4 weeks REPEAT EXAM:   You will receive a letter from Dr. Arlyce Dice in 1-2 weeks, after reviewing the final pathology, with followup recommendations.  ______________________________ Barbette Hair Arlyce Dice, MD  CC:  n. eSIGNED:   Barbette Hair. Rhyder Koegel at 12/30/2011 02:19 PM  Ezzard Flax, [phone]

## 2011-12-30 NOTE — Patient Instructions (Addendum)
YOU HAD AN ENDOSCOPIC PROCEDURE TODAY AT THE [facility]: Refer to the procedure report that was given to you for any specific questions about what was found during the examination.  If the procedure report does not answer your questions, please call your gastroenterologist to clarify.  If you requested that your care partner not be given the details of your procedure findings, then the procedure report has been included in a sealed envelope for you to review at your convenience later.  YOU SHOULD EXPECT: Some feelings of bloating in the abdomen. Passage of more gas than usual.  Walking can help get rid of the air that was put into your GI tract during the procedure and reduce the bloating. If you had a lower endoscopy (such as a colonoscopy or flexible sigmoidoscopy) you may notice spotting of blood in your stool or on the toilet paper. If you underwent a bowel prep for your procedure, then you may not have a normal bowel movement for a few days.  DIET: Your first meal following the procedure should be a light meal and then it is ok to progress to your normal diet.  A half-sandwich or bowl of soup is an example of a good first meal.  Heavy or fried foods are harder to digest and may make you feel nauseous or bloated.  Likewise meals heavy in dairy and vegetables can cause extra gas to form and this can also increase the bloating.  Drink plenty of fluids but you should avoid alcoholic beverages for 24 hours.  ACTIVITY: Your care partner should take you home directly after the procedure.  You should plan to take it easy, moving slowly for the rest of the day.  You can resume normal activity the day after the procedure however you should NOT DRIVE or use heavy machinery for 24 hours (because of the sedation medicines used during the test).    SYMPTOMS TO REPORT IMMEDIATELY: A gastroenterologist can be reached at any hour.  During normal business hours, 8:30 AM to 5:00 PM Monday through Friday,  call [phone].  After hours and on weekends, please call the GI answering service at [phone] who will take a message and have the physician on call contact you.   Following lower endoscopy (colonoscopy or flexible sigmoidoscopy):  Excessive amounts of blood in the stool  Significant tenderness or worsening of abdominal pains  Swelling of the abdomen that is new, acute  Fever of 100F or higher   FOLLOW UP: If any biopsies were taken you will be contacted by phone or by letter within the next 1-3 weeks.  Call your gastroenterologist if you have not heard about the biopsies in 3 weeks.  Our staff will call the home number listed on your records the next business day following your procedure to check on you and address any questions or concerns that you may have at that time regarding the information given to you following your procedure. This is a courtesy call and so if there is no answer at the home number and we have not heard from you through the emergency physician on call, we will assume that you have returned to your regular daily activities without incident.  SIGNATURES/CONFIDENTIALITY: You and/or your care partner have signed paperwork which will be entered into your electronic medical record.  These signatures attest to the fact that that the information above on your After Visit Summary has been reviewed and is understood.  Full responsibility of the confidentiality of   this discharge information lies with you and/or your care-partner.   Resume medications. Information given on polyps,hemorrhoids and high fiber diet with discharge instructions. 

## 2011-12-30 NOTE — ED Notes (Signed)
Increased pain on left abd

## 2011-12-30 NOTE — ED Provider Notes (Signed)
History     CSN: [id number]  Arrival date & time 12/30/11  2040   First MD Initiated Contact with Patient 12/30/11 2315      Chief Complaint  Patient presents with  . Abdominal Pain    (Consider location/radiation/quality/duration/timing/severity/associated sxs/prior treatment) HPI Comments: 78 year old female with a history of renal cell carcinoma status post nephrectomy, hemorrhoids, hypertension, diabetes who presents after having colonoscopy approximately 9 hours ago. She states that several hours after the colonoscopy she started to develop significant pain in her left lower quadrant radiating to the mid abdomen. This is persistent, gradually worsening, associated with fever of 101 at home. She denies nausea, vomiting, change in stools, coughing but was short of breath trying to climb stairs after being discharged from the hospital earlier today. She took 3 doses of ciprofloxacin last week for what she thought was a urinary infection but stopped taking it secondary to poor medication tolerance and side effects.  Patient is a 78 y.o. female presenting with abdominal pain. The history is provided by the patient and the spouse.  Abdominal Pain The primary symptoms of the illness include abdominal pain.    Past Medical History  Diagnosis Date  . Osteopetrosis   . Renal cancer   . S/p nephrectomy   . Hemorrhoids   . Hypertension     Past Surgical History  Procedure Date  . Abdominal surgery 2012    for renal carcinoma tumor  . Abdominal hysterectomy   . Colonoscopy 12/29/2011    Procedure: COLONOSCOPY;  Surgeon: Louis Meckel, MD;  Location: [facility];  Service: Endoscopy;  Laterality: N/A;    Family History  Problem Relation Age of Onset  . Colon cancer Neg Hx   . Kidney cancer Mother     History  Substance Use Topics  . Smoking status: Former Smoker -- 1.0 packs/day    Quit date: 12/29/1983  . Smokeless tobacco: Never Used  . Alcohol Use: 8.4 oz/week    14  Glasses of wine per week     1-2 glasses of wine/day    OB History    Grav Para Term Preterm Abortions TAB SAB Ect Mult Living                  Review of Systems  Gastrointestinal: Positive for abdominal pain.  All other systems reviewed and are negative.    Allergies  Review of patient's allergies indicates no known allergies.  Home Medications   Current Outpatient Rx  Name Route Sig Dispense Refill  . VITAMIN D 1000 UNITS PO TABS Oral Take 1,000 Units by mouth daily.      Marland Kitchen HYDROCORTISONE ACETATE 25 MG RE SUPP Rectal Place 1 suppository (25 mg total) rectally every 12 (twelve) hours. 12 suppository 1  . LOSARTAN POTASSIUM-HCTZ 100-12.5 MG PO TABS Oral Take by mouth as directed.     Marland Kitchen FISH OIL BURP-LESS 500 MG PO CAPS Oral Take 1,500 mg by mouth daily.      Marland Kitchen HYDROCODONE-ACETAMINOPHEN 5-500 MG PO TABS Oral Take 1-2 tablets by mouth every 6 (six) hours as needed for pain. 15 tablet 0  . SULFAMETHOXAZOLE-TRIMETHOPRIM 800-160 MG PO TABS Oral Take 1 tablet by mouth every 12 (twelve) hours. 20 tablet 0    BP 112/74  Pulse 66  Temp 98.4 F (36.9 C) (Oral)  Resp 17  Ht 5\' 5"  (1.651 m)  Wt 140 lb (63.504 kg)  BMI 23.30 kg/m2  SpO2 97%  Physical Exam  Nursing note and  vitals reviewed. Constitutional: She appears well-developed and well-nourished. No distress.  HENT:  Head: Normocephalic and atraumatic.  Mouth/Throat: Oropharynx is clear and moist. No oropharyngeal exudate.  Eyes: Conjunctivae and EOM are normal. Pupils are equal, round, and reactive to light. Right eye exhibits no discharge. Left eye exhibits no discharge. No scleral icterus.  Neck: Normal range of motion. Neck supple. No JVD present. No thyromegaly present.  Cardiovascular: Normal rate, regular rhythm, normal heart sounds and intact distal pulses.  Exam reveals no gallop and no friction rub.   No murmur heard. Pulmonary/Chest: Effort normal. No respiratory distress. She has no wheezes. She has rales ( Mild  rales at the right base, clear with coughing).  Abdominal: Soft. Bowel sounds are normal. She exhibits distension. She exhibits no mass. There is tenderness ( Tenderness to the left lower quadrant, mild tenderness to the suprapubic and right lower quadrant area. Non-peritoneal, tympanitic sounds to percussion.).  Musculoskeletal: Normal range of motion. She exhibits no edema and no tenderness.  Lymphadenopathy:    She has no cervical adenopathy.  Neurological: She is alert. Coordination normal.  Skin: Skin is warm and dry. No rash noted. No erythema.  Psychiatric: She has a normal mood and affect. Her behavior is normal.    ED Course  Procedures (including critical care time)  Labs Reviewed  CBC WITH DIFFERENTIAL - Abnormal; Notable for the following:    WBC 13.8 (*)     Neutrophils Relative 85 (*)     Neutro Abs 11.7 (*)     Lymphocytes Relative 8 (*)     All other components within normal limits  COMPREHENSIVE METABOLIC PANEL - Abnormal; Notable for the following:    Sodium 128 (*)     Chloride 94 (*)     Glucose, Bld 107 (*)     GFR calc non Af Amer 60 (*)     GFR calc Af Amer 69 (*)     All other components within normal limits  URINALYSIS, ROUTINE W REFLEX MICROSCOPIC - Abnormal; Notable for the following:    Hgb urine dipstick MODERATE (*)     Leukocytes, UA LARGE (*)     All other components within normal limits  URINE MICROSCOPIC-ADD ON - Abnormal; Notable for the following:    Bacteria, UA FEW (*)     All other components within normal limits   Ct Abdomen Pelvis W Contrast  12/31/2011  *RADIOLOGY REPORT*  Clinical Data: Left lower quadrant abdominal pain after colonoscopy.  CT ABDOMEN AND PELVIS WITH CONTRAST  Technique:  Multidetector CT imaging of the abdomen and pelvis was performed following the standard protocol during bolus administration of intravenous contrast.  Contrast: 80mL OMNIPAQUE IOHEXOL 300 MG/ML  SOLN  Comparison: CT of the abdomen and pelvis performed  05/21/2011, and abdominal radiograph performed 12/30/2011  Findings: Minimal bibasilar atelectasis is noted.  A few calcified granulomata are noted within the liver; scattered calcified granulomata are noted throughout the spleen.  The liver and spleen are otherwise unremarkable in appearance.  The gallbladder is within normal limits.  The pancreas and adrenal glands are unremarkable.  The patient is status post right-sided nephrectomy.  The left kidney is unremarkable in appearance.  There is no evidence of hydronephrosis.  No renal or ureteral stones are seen.  The small bowel is unremarkable in appearance.  The stomach is within normal limits.  No acute vascular abnormalities are seen. Mild scattered calcification is noted along the abdominal aorta and its branches.  Mild wall  thickening and soft tissue inflammation are noted along a short segment of descending colon, with trace associated free fluid.  This raises concern for mild bowel injury.  A few adjacent foci of air are thought to remain intraluminal in nature; no definite extraluminal air is identified.  Mild scattered diverticulosis is noted along the ascending and descending colon.  The appendix is unremarkable in appearance; there is no evidence for appendicitis.  The bladder is mildly distended and grossly unremarkable in appearance.  The patient is status post hysterectomy.  The ovaries are relatively symmetric; no suspicious adnexal masses are seen. No inguinal lymphadenopathy is seen.  No acute osseous abnormalities are identified.  Vacuum phenomenon and disc space narrowing are noted at L5-S1.  IMPRESSION:  1.  Mild wall thickening and soft tissue inflammation along a short segment of descending colon, with trace associated free fluid. This raises concern for mild bowel injury.  A few adjacent foci of air are thought to remain intraluminal in nature; no definite free intra-abdominal air seen. 2.  Mild scattered diverticulosis along the ascending  and descending colon. 3.  Mild scattered calcification along the abdominal aorta and its branches.  Original Report Authenticated By: Tonia Ghent, M.D.   Dg Abd Acute W/chest  12/30/2011  *RADIOLOGY REPORT*  Clinical Data: Abdominal pain, status post colonoscopy.  ACUTE ABDOMEN SERIES (ABDOMEN 2 VIEW & CHEST 1 VIEW)  Comparison: CT of the abdomen and pelvis from 10/09/2011  Findings: The lungs are hyperexpanded, with flattening of the hemidiaphragms, compatible with COPD.  Minimal nodular density at the left midlung zone may reflect remote sequelae of infection or mild scarring.  Minimal left basilar atelectasis is seen.  There is no evidence of pleural effusion or pneumothorax.  The cardiomediastinal silhouette is will in size; the patient is status post median sternotomy.  The visualized bowel gas pattern is unremarkable.  The colon is largely filled with air, without evidence of dilatation; there is no evidence of small bowel dilatation to suggest obstruction.  No free intra-abdominal air is identified on the provided upright view.  No acute osseous abnormalities are seen; the sacroiliac joints are unremarkable in appearance.  Scattered clips are noted about the right side of the abdomen.  IMPRESSION:  1.  Unremarkable bowel gas pattern; no free intra-abdominal air seen.  Colon largely filled with air, reflecting recent colonoscopy. 2.  Findings of COPD; minimal nodular density at the left midlung zone may reflect remote sequelae of infection or mild scarring. Minimal left basilar atelectasis seen.  No acute cardiopulmonary process identified.  Original Report Authenticated By: Tonia Ghent, M.D.     1. Abdominal pain   2. UTI (lower urinary tract infection)       MDM  Patient does not have an objective fever here, there is no tachycardia hypoxia or respiratory distress. Blood pressure is elevated and abdomen is significantly tender in the left lower quadrant. She does not have peritoneal signs at  this time, proceed with imaging lab work.  The patient was complaining of shakiness prompting her visit, she is hyponatremic with a sodium of 128 and has a leukocytosis of 13.8  ED ECG REPORT  I personally interpreted this EKG   Date: 12/31/2011   Rate: 80  Rhythm: normal sinus rhythm  QRS Axis: left  Intervals: normal  ST/T Wave abnormalities: nonspecific ST/T changes  Conduction Disutrbances:left bundle branch block  Narrative Interpretation:   Old EKG Reviewed: none available  Discussed care with gastroenterologist on call Dr.Gessner - will  arrange for the patient be seen in the office today, patient is aware of her CT scan findings and urinalysis. Rocephin given in emergency department, prescribed Bactrim as outpatient and she has a Keflex allergy. Pain well-controlled, patient appears stable for discharge.  Discharge Prescriptions include:  Bactrim Hydrocodone   Vida Roller, MD 12/31/11 [phone]

## 2011-12-30 NOTE — ED Notes (Signed)
Drinking oral contrast 

## 2011-12-30 NOTE — Progress Notes (Signed)
Abdominal pressure by the tech to aid the scope advancement. Maw   

## 2011-12-30 NOTE — Progress Notes (Signed)
Patient did not experience any of the following events: a burn prior to discharge; a fall within the facility; wrong site/side/patient/procedure/implant event; or a hospital transfer or hospital admission upon discharge from the facility. (G8907) Patient did not have preoperative order for IV antibiotic SSI prophylaxis. (G8918)  

## 2011-12-30 NOTE — ED Notes (Signed)
Acuity level increased due to low sodium level

## 2011-12-30 NOTE — ED Notes (Signed)
Pt had colonoscopy in office today around 2 pm.  Tried to eat some soup this afternoon and developed abd pain.  Shaking.  Weak.  Denies n/v.

## 2011-12-30 NOTE — Progress Notes (Signed)
The pt tolerated the colonoscopy very well. Maw   

## 2011-12-31 ENCOUNTER — Telehealth: Payer: Self-pay | Admitting: *Deleted

## 2011-12-31 ENCOUNTER — Telehealth: Payer: Self-pay | Admitting: Internal Medicine

## 2011-12-31 ENCOUNTER — Telehealth: Payer: Self-pay | Admitting: Gastroenterology

## 2011-12-31 ENCOUNTER — Telehealth: Payer: Self-pay

## 2011-12-31 LAB — URINE MICROSCOPIC-ADD ON

## 2011-12-31 LAB — URINALYSIS, ROUTINE W REFLEX MICROSCOPIC
Ketones, ur: NEGATIVE mg/dL
Nitrite: NEGATIVE
Protein, ur: NEGATIVE mg/dL
pH: 5 (ref 5.0–8.0)

## 2011-12-31 MED ORDER — HYDROCODONE-ACETAMINOPHEN 5-325 MG PO TABS
2.0000 | ORAL_TABLET | Freq: Once | ORAL | Status: AC
  Administered 2011-12-31: 2 via ORAL
  Filled 2011-12-31: qty 2

## 2011-12-31 MED ORDER — DEXTROSE 5 % IV SOLN
1.0000 g | Freq: Once | INTRAVENOUS | Status: AC
  Administered 2011-12-31: 1 g via INTRAVENOUS
  Filled 2011-12-31: qty 10

## 2011-12-31 MED ORDER — HYDROCODONE-ACETAMINOPHEN 5-500 MG PO TABS: 1.0000 | ORAL_TABLET | Freq: Four times a day (QID) | ORAL | Status: AC | PRN

## 2011-12-31 MED ORDER — IOHEXOL 300 MG/ML  SOLN
80.0000 mL | Freq: Once | INTRAMUSCULAR | Status: AC | PRN
  Administered 2011-12-31: 80 mL via INTRAVENOUS

## 2011-12-31 MED ORDER — SULFAMETHOXAZOLE-TRIMETHOPRIM 800-160 MG PO TABS: 1.0000 | ORAL_TABLET | Freq: Two times a day (BID) | ORAL | Status: AC

## 2011-12-31 NOTE — ED Notes (Signed)
Patient transported to CT 

## 2011-12-31 NOTE — Telephone Encounter (Signed)
Patient feeling so much better. Eating foods in small amounts seems to be doing ok

## 2011-12-31 NOTE — Telephone Encounter (Signed)
Patient called back to inform she felt much better this afternoon. Per Dr Arlyce Dice patient can eat small amounts of food at a time. But must contact the office if she feels worse.Marland KitchenMarland Kitchen

## 2011-12-31 NOTE — Telephone Encounter (Signed)
See additional telephone note, pt has already been spoken to regarding issue.

## 2011-12-31 NOTE — Telephone Encounter (Signed)
Called 7/11 PM due to complaints of fever T101 after colonoscopy.  She also said she had abdominal pain in lower abdomen, better with gas passage but some pain with bending over.  Directed to ED.  Spoke with Dr. Hyacinth Meeker of ED this AM about 0300 - patient had UTI and got ceftriaxone and outpatient Keflex for abnormal UA. WBC slightly elevated. CT abd/pelvis - thickened descending colon without perforation.  She was tender in LLQ but in no distress and asking to go home.  I advised that was ok but stay on clear liquids and she will be seen in office today for reassessment.  We will call her to reassess and arrange follow-up.

## 2011-12-31 NOTE — Telephone Encounter (Signed)
  Follow up Call-  Call back number 12/30/2011  Post procedure Call Back phone  # [phone]  Permission to leave phone message Yes     Patient questions:  Do you have a fever, pain , or abdominal swelling? yes Pain Score  3 *  Have you tolerated food without any problems? no  Have you been able to return to your normal activities? no  Do you have any questions about your discharge instructions: Diet   no Medications  no Follow up visit  yes  Do you have questions or concerns about your Care? yes  Actions: * If pain score is 4 or above: No action needed, pain <4.  Per the pt she had llq abd pain yesterday at about 5:30 or 6:00 pm rating the pain 6 or 7 out of 10.  She called the on call dr.  He pt was advised to go to the ER.  Pt went and they did a CT and labs.  Per the pt CT showed "bruising of left side of colon".  She had a temp of 101 per the pt and tremors and could not get warm.  She also said she had a UTI and her white blood count was up.  She was given a RX for vicoden and septra DS and was discharged home around 4:00 am this morning.  They advise the pt to call Dr. Marzetta Board office and be seen here today.  I tole the pt the phone would be turned on today at 8:00 and to call ASAP to see when she could get an appointment today.  Pt said she would.  This am she said she was extremely tird and rated LLQ abd pain 3 or 4 now. Maw

## 2011-12-31 NOTE — Telephone Encounter (Signed)
Per patient she feels better today. She has walked around and released gas. Dr Arlyce Dice told me to tell her that she needs to come to the office if she starts to feel any worse or having any pain. Patient stated she will call us today later and let us know how she is doing. She soes not have a fever at this time

## 2012-01-01 ENCOUNTER — Telehealth: Payer: Self-pay | Admitting: Gastroenterology

## 2012-01-01 NOTE — Telephone Encounter (Signed)
Nicole Preston.This patient called and wanted me to let Dr Arlyce Dice know that she was awake every hour last night with a feeling of abdominal pressure across her abdomen No Pain just discomfort that rates about a 4-6 She is taking Bactrium for a UTI that makes her nauseated and wants to know if it could be her UTI causing the issue.  No Temp BP Normal Please review patients notes she had a colonoscopy a few days ago then had to go to the ED for abdominal pain. Please see CT report  No DOC OF THE DAY this afternoon and Dr Arlyce Dice is gone...Marland KitchenMarland KitchenThanks Nicole Preston

## 2012-01-01 NOTE — Telephone Encounter (Signed)
Her abdominal pain is likely from the inflammation in her left colon which happened with the colonoscopy , i think she should be seen in the office tomorrow since she is still hurting- Dr. Arlyce Dice should see her but if absolutely unable  i could see her in afternoon tomorrow

## 2012-01-01 NOTE — Telephone Encounter (Signed)
OK PATIENT IS SCHEDULED TO SEE AMY ON 01/02/2012 AY 2 PM PER AMY [place]. PT AWARE

## 2012-01-02 ENCOUNTER — Other Ambulatory Visit (INDEPENDENT_AMBULATORY_CARE_PROVIDER_SITE_OTHER): Payer: Medicare Other

## 2012-01-02 ENCOUNTER — Encounter: Payer: Self-pay | Admitting: Physician Assistant

## 2012-01-02 ENCOUNTER — Ambulatory Visit (INDEPENDENT_AMBULATORY_CARE_PROVIDER_SITE_OTHER): Payer: Medicare Other | Admitting: Physician Assistant

## 2012-01-02 VITALS — BP 152/70 | HR 60 | Temp 97.8°F | Ht 65.0 in | Wt 141.0 lb

## 2012-01-02 DIAGNOSIS — S36502A Unspecified injury of descending [left] colon, initial encounter: Secondary | ICD-10-CM

## 2012-01-02 DIAGNOSIS — K5289 Other specified noninfective gastroenteritis and colitis: Secondary | ICD-10-CM

## 2012-01-02 DIAGNOSIS — K635 Polyp of colon: Secondary | ICD-10-CM | POA: Insufficient documentation

## 2012-01-02 LAB — CBC WITH DIFFERENTIAL/PLATELET
Basophils Absolute: 0 10*3/uL (ref 0.0–0.1)
Hemoglobin: 11.9 g/dL — ABNORMAL LOW (ref 12.0–15.0)
Lymphocytes Relative: 23.9 % (ref 12.0–46.0)
Monocytes Relative: 10.7 % (ref 3.0–12.0)
Neutro Abs: 4.2 10*3/uL (ref 1.4–7.7)
RDW: 13.4 % (ref 11.5–14.6)
WBC: 6.8 10*3/uL (ref 4.5–10.5)

## 2012-01-02 MED ORDER — AMOXICILLIN-POT CLAVULANATE 500-125 MG PO TABS: ORAL_TABLET | ORAL | Status: DC

## 2012-01-02 NOTE — Progress Notes (Signed)
Subjective:    Patient ID: Nicole Preston, female    DOB: 1934/04/26, 78 y.o.   MRN: [id number]  HPI Nicole Preston is a 78 year old white female known to Dr. Arlyce Dice who underwent colonoscopy on 12/29/2011 per Dr. Arlyce Dice on of this was an incomplete exam as the per patient was unable to be adequately sedated. Procedure was rescheduled for the following day 12/30/2011 with propofol. She was found to have a small sessile polyp in the descending colon 10 mm which was snared and removed without cautery. She was found to have significant acute angulation and stiffness at the sigmoid curve as scope passed with difficulty. She was also found to have internal hemorrhoids. She tolerated the procedure well however later that evening developed abdominal pain as well as fever and shaking chills. She went to the emergency room where she was seen and evaluated by the ER physician  . She had CT scan of the abdomen and pelvis done with finding of mild wall thickening in soft tissue inflammation along short segment of descending colon with trace associated free fluid consistent with a mild bowel injury there were a few adjacent foci of air thought to remain intraluminal no definite free intra-abdominal air also mild scattered diverticulosis along the descending and descending colon. She is status post right nephrectomy. WBC was 13.8 hemoglobin 13.2 lites showed a sodium of 128 otherwise unremarkable and urinalysis was positive coding 21-50 wbc's and a few bacteria and few RBCs. Should not have a culture done but was started on Bactrim. She was also given Vicodin to take for pain.  She was very tired the next day from spending the night in the emergency room, felt better on Thursday 711 and was more active. She says she developed some worsening of abdominal cramping later in the afternoon yesterday. She called here and appointment was made for today. Patient says she feels better today. She does feel somewhat bloated and distended still  and has some discomfort with walking. She has not had a bowel movement since her procedure. She says she has been uncomfortable at night but took a Vicodin last night and that did help. She has not had any further fever in at least 36 hours. She says she doesn't have much of an appetite and has been eating very small amounts. She has no current urinary symptoms.    Review of Systems  Constitutional: Positive for fever and appetite change.  HENT: Negative.   Eyes: Negative.   Respiratory: Negative.   Cardiovascular: Negative.   Gastrointestinal: Positive for abdominal pain and abdominal distention.  Genitourinary: Negative.   Musculoskeletal: Negative.   Neurological: Negative.   Hematological: Negative.   Psychiatric/Behavioral: Negative.    Outpatient Prescriptions Prior to Visit  Medication Sig Dispense Refill  . HYDROcodone-acetaminophen (VICODIN) 5-500 MG per tablet Take 1-2 tablets by mouth every 6 (six) hours as needed for pain.  15 tablet  0  . hydrocortisone (ANUSOL-HC) 25 MG suppository Place 1 suppository (25 mg total) rectally every 12 (twelve) hours.  12 suppository  1  . losartan-hydrochlorothiazide (HYZAAR) 100-12.5 MG per tablet Take by mouth as directed. 1/4 tablet      . sulfamethoxazole-trimethoprim (SEPTRA DS) 800-160 MG per tablet Take 1 tablet by mouth every 12 (twelve) hours.  20 tablet  0  . cholecalciferol (VITAMIN D) 1000 UNITS tablet Take 1,000 Units by mouth daily.        . Omega-3 Fatty Acids (FISH OIL BURP-LESS) 500 MG CAPS Take 1,500 mg by mouth  daily.         No Known Allergies     Patient Active Problem List  Diagnosis  . Hemorrhage of rectum and anus  . Renal cell carcinoma  . Colon polyp   History   Social History  . Marital Status: Married    Spouse Name: N/A    Number of Children: 5  . Years of Education: N/A   Occupational History  . Retired     Social History Main Topics  . Smoking status: Former Smoker -- 1.0 packs/day    Quit  date: 12/29/1983  . Smokeless tobacco: Never Used  . Alcohol Use: 8.4 oz/week    14 Glasses of wine per week     1-2 glasses of wine/day  . Drug Use: No  . Sexually Active: Not on file   Other Topics Concern  . Not on file   Social History Narrative   Daily caffeine     Objective:   Physical Exam well-developed older white female who appears younger than her stated age. Accompanied by her husband blood pressure 152/70 pulse 60 temp 97 8. HEENT; nontraumatic normocephalic EOMI PERRLA sclera anicteric,Neck; Supple no JVD, Cardiovascular; regular rate and rhythm with S1-S2 with soft systolic murmur pulmonary clear bilaterally abdomen soft she has a long incisional scar in her sternum down to the pubic area. Bowel sounds are present there is no distention she is tender in the left mid quadrant and left lower quadrant no guarding no rebound no palpable mass or hepatosplenomegaly, Rectal ;exam not done, Extremities; no clubbing cyanosis or edema skin warm and dry, Psych; mood and affect normal and appropriate.        Assessment & Plan:  #51 78 year old female 3 days status post colonoscopy with polypectomy of a small sigmoid colon polyp removed by snare in no cautery. Patient has a post colonoscopy descending colon injury which likely occurred secondary to difficulty getting around the acute angle he angulation and fixed sigmoid colon which was noted at the time of the procedure. Patient had a major surgery for a locally advanced renal cell cancer and likely has adhesive disease. At this time she is stable and is clinically improving.  #2 sigmoid colon polyp #3 rectal bleeding likely secondary to internal hemorrhoids #4 history of renal cell cancer locally advanced status post resection Plan; CT findings were discussed in detail with the patient and her husband, she had not really understood what had happened ,and this was clarified. She is advised to take it easy over the next 2-3 days  strenuous exercise etc. Milk of magnesia as needed or stool softener for her constipation Vicodin as needed for pain She was given Bactrim for a bubble UTI, will stop this and give her Augmentin 500 mg/125 twice a day x5 days to better cover gut pathogens Small frequent soft meals Patient is advised to call and/or go to the emergency room should she have any worsening of her pain recurrent fever etc. We will call in check on her progress in 2-3 days

## 2012-01-02 NOTE — Patient Instructions (Addendum)
Please go to the basement level to have your labs drawn.   We sent a prescription for Augmentin 500/125 mg to [facility] [address] Rest at home this weekend. Eat small, soft, frequent meals. You can use stool softeners or ;Milk of Magnesia if you have not had a bowel movement by Sunday. Call us if the pain worsens.  [phone].

## 2012-01-05 ENCOUNTER — Telehealth: Payer: Self-pay

## 2012-01-05 ENCOUNTER — Encounter: Payer: Self-pay | Admitting: Gastroenterology

## 2012-01-05 NOTE — Telephone Encounter (Signed)
Ok, as long as she is not having any residual pain or cramping she may resume her activities, and advance her diet

## 2012-01-05 NOTE — Telephone Encounter (Signed)
Spoke with pt and she states she is not having any pain or cramping. She was excited to go back to her normal diet.

## 2012-01-05 NOTE — Telephone Encounter (Signed)
Spoke with pt and she states she is feeling better. Pt went for walk yesterday and went to the gym today and rode the bike for . Pt reports she is very tired today. Pt wanted to know if she can eat salads yet. Please advise.

## 2012-01-05 NOTE — Telephone Encounter (Signed)
good

## 2012-01-05 NOTE — Telephone Encounter (Signed)
Called pt to check on her and see how she is doing. Left message for pt to call back.

## 2012-01-26 ENCOUNTER — Telehealth: Payer: Self-pay | Admitting: Gastroenterology

## 2012-01-26 NOTE — Telephone Encounter (Signed)
Pt called wanting to know why she needs to come back for OV following her procedure. States she does not want to spend the money if she really doesn't need to be seen. Please advise.

## 2012-01-26 NOTE — Telephone Encounter (Signed)
Appointment cancelled per pt request.

## 2012-01-26 NOTE — Telephone Encounter (Signed)
If the patient's feeling well she does not have to followup. The followup appointment was for rectal bleeding. If it remains a persistent problem she should make an office visit.

## 2012-02-16 ENCOUNTER — Ambulatory Visit: Payer: Medicare Other | Admitting: Gastroenterology

## 2012-08-03 ENCOUNTER — Other Ambulatory Visit: Payer: Self-pay | Admitting: Orthopedic Surgery

## 2012-08-03 DIAGNOSIS — R531 Weakness: Secondary | ICD-10-CM

## 2012-08-03 DIAGNOSIS — M545 Low back pain: Secondary | ICD-10-CM

## 2012-08-12 ENCOUNTER — Other Ambulatory Visit: Payer: Self-pay | Admitting: Urology

## 2012-08-12 ENCOUNTER — Other Ambulatory Visit: Payer: Medicare Other

## 2012-08-12 DIAGNOSIS — C649 Malignant neoplasm of unspecified kidney, except renal pelvis: Secondary | ICD-10-CM

## 2012-08-23 ENCOUNTER — Other Ambulatory Visit: Payer: Self-pay | Admitting: Urology

## 2012-08-23 ENCOUNTER — Other Ambulatory Visit: Payer: Self-pay | Admitting: Orthopedic Surgery

## 2012-08-23 ENCOUNTER — Ambulatory Visit
Admission: RE | Admit: 2012-08-23 | Discharge: 2012-08-23 | Disposition: A | Discharge status: Home / Self Care | Payer: Medicare Other | Source: Ambulatory Visit | Attending: Urology | Admitting: Urology

## 2012-08-23 ENCOUNTER — Ambulatory Visit
Admission: RE | Admit: 2012-08-23 | Discharge: 2012-08-23 | Disposition: A | Discharge status: Home / Self Care | Payer: Medicare Other | Source: Ambulatory Visit | Attending: Orthopedic Surgery | Admitting: Orthopedic Surgery

## 2012-08-23 DIAGNOSIS — M545 Low back pain: Secondary | ICD-10-CM

## 2012-08-23 DIAGNOSIS — R5383 Other fatigue: Secondary | ICD-10-CM

## 2012-08-23 DIAGNOSIS — C649 Malignant neoplasm of unspecified kidney, except renal pelvis: Secondary | ICD-10-CM

## 2012-08-23 DIAGNOSIS — R531 Weakness: Secondary | ICD-10-CM

## 2012-08-23 DIAGNOSIS — R5381 Other malaise: Secondary | ICD-10-CM

## 2012-08-23 MED ORDER — GADOBENATE DIMEGLUMINE 529 MG/ML IV SOLN
13.0000 mL | Freq: Once | INTRAVENOUS | Status: AC | PRN
  Administered 2012-08-23: 13 mL via INTRAVENOUS

## 2012-10-11 ENCOUNTER — Other Ambulatory Visit: Payer: Medicare Other

## 2013-10-05 ENCOUNTER — Other Ambulatory Visit: Payer: Self-pay | Admitting: Urology

## 2013-10-05 DIAGNOSIS — Z85528 Personal history of other malignant neoplasm of kidney: Secondary | ICD-10-CM

## 2013-10-12 ENCOUNTER — Other Ambulatory Visit: Payer: Medicare Other

## 2013-10-17 ENCOUNTER — Other Ambulatory Visit: Payer: Medicare Other

## 2013-10-25 ENCOUNTER — Ambulatory Visit
Admission: RE | Admit: 2013-10-25 | Discharge: 2013-10-25 | Disposition: A | Discharge status: Home / Self Care | Payer: Medicare Other | Source: Ambulatory Visit | Attending: Urology | Admitting: Urology

## 2013-10-25 DIAGNOSIS — Z85528 Personal history of other malignant neoplasm of kidney: Secondary | ICD-10-CM

## 2013-10-25 MED ORDER — GADOBENATE DIMEGLUMINE 529 MG/ML IV SOLN
13.0000 mL | Freq: Once | INTRAVENOUS | Status: AC | PRN
  Administered 2013-10-25: 13 mL via INTRAVENOUS

## 2013-12-27 ENCOUNTER — Emergency Department (INDEPENDENT_AMBULATORY_CARE_PROVIDER_SITE_OTHER): Payer: Medicare Other

## 2013-12-27 ENCOUNTER — Emergency Department (HOSPITAL_COMMUNITY)
Admission: EM | Admit: 2013-12-27 | Discharge: 2013-12-27 | Disposition: A | Discharge status: Home / Self Care | Payer: Medicare Other | Source: Home / Self Care | Attending: Family Medicine | Admitting: Family Medicine

## 2013-12-27 ENCOUNTER — Encounter (HOSPITAL_COMMUNITY): Payer: Self-pay | Admitting: Emergency Medicine

## 2013-12-27 DIAGNOSIS — S42309A Unspecified fracture of shaft of humerus, unspecified arm, initial encounter for closed fracture: Secondary | ICD-10-CM

## 2013-12-27 DIAGNOSIS — W010XXA Fall on same level from slipping, tripping and stumbling without subsequent striking against object, initial encounter: Secondary | ICD-10-CM

## 2013-12-27 DIAGNOSIS — S42301A Unspecified fracture of shaft of humerus, right arm, initial encounter for closed fracture: Secondary | ICD-10-CM

## 2013-12-27 MED ORDER — HYDROCODONE-ACETAMINOPHEN 5-325 MG PO TABS
1.0000 | ORAL_TABLET | ORAL | Status: DC | PRN
Start: 2013-12-27 — End: 2014-03-10

## 2013-12-27 MED ORDER — HYDROCODONE-ACETAMINOPHEN 5-325 MG PO TABS
1.0000 | ORAL_TABLET | Freq: Once | ORAL | Status: AC
  Administered 2013-12-27: 1 via ORAL

## 2013-12-27 MED ORDER — HYDROCODONE-ACETAMINOPHEN 5-325 MG PO TABS
ORAL_TABLET | ORAL | Status: AC
  Filled 2013-12-27: qty 1

## 2013-12-27 NOTE — Discharge Instructions (Signed)
Fracture A fracture is a break in a bone, due to a force on the bone that is greater than the bone's strength can handle. There are many types of fractures, including:  Complete fracture: The break passes completely through the bone.  Displaced: The ends of the bone fragments are not properly aligned.  Non-displaced: The ends of the bone fragments are in proper alignment.  Incomplete fracture (greenstick): The break does not pass completely through the bone. Incomplete fractures may or may not be angular (angulated).  Open fracture (compound): Part of the broken bone pokes through the skin. Open fractures have a high risk for infection.  Closed fracture: The fracture has not broken through the skin.  Comminuted fracture: The bone is broken into more than two pieces.  Compression fracture: The break occurs from extreme pressure on the bone (includes crushing injury).  Impacted fracture: The broken bone ends have been driven into each other.  Avulsion fracture: A ligament or tendon pulls a small piece of bone off from the main bony segment.  Pathologic fracture: A fracture due to the bone being made weak by a disease (osteoporosis or tumors).  Stress fracture: A fracture caused by intense exercise or repetitive and prolonged pressure that makes the bone weak. SYMPTOMS   Pain, tenderness, bleeding, bruising, and swelling at the fracture site.  Weakness and inability to bear weight on the injured extremity.  Paleness and deformity (sometimes).  Loss of pulse, numbness, tingling, or paralysis below the fracture site (usually a limb); these are emergencies. CAUSES  Bone being subjected to a force greater than its strength. RISK INCREASES WITH:  Contact sports and falls from heights.  Previous or current bone problems (osteoporosis or tumors).  Poor balance.  Poor strength and flexibility. PREVENTION   Warm up and stretch properly before activity.  Maintain physical  fitness:  Cardiovascular fitness.  Muscle strength.  Flexibility and endurance.  Wear proper protective equipment.  Use proper exercise technique. RELATED COMPLICATIONS   Bone fails to heal (nonunion).  Bone heals in a poor position (malunion).  Low blood volume (hypovolemic), shock due to blood loss.  Clump of fat cells travels through the blood (fat embolus) from the injury site to the lungs or brain (more common with thigh fractures).  Obstruction of nearby arteries. TREATMENT  Treatment first requires realigning of the bones (reduction) by a medically trained person, if the fracture is displaced. After realignment if the fracture is completed, or for non-displaced fractures, ice and medicine are used to reduce pain and inflammation. The bone and adjacent joints are then restrained with a splint, cast, or brace to allow the bones to heal without moving. Surgery is sometimes needed, to reposition the bones and hold the position with rods, pins, plates, or screws. Restraint for long periods of time may result in muscle and joint weakness or build up of fluid in tissues (edema). For this reason, physical therapy is often needed to regain strength and full range of motion. Recovery is complete when there is no bone motion at the fracture site and x-rays (radiographs) show complete healing.  MEDICATION   General anesthesia, sedation, or muscle relaxants may be needed to allow for realignment of the fracture. If pain medicine is needed, nonsteroidal anti-inflammatory medicines (aspirin and ibuprofen), or other minor pain relievers (acetaminophen), are often advised.  Do not take pain medicine for 7 days before surgery.  Stronger pain relievers may be prescribed by your caregiver. Use only as directed and only as much  as you need. SEEK MEDICAL CARE IF:   The following occur after restraint or surgery. (Report any of these signs immediately):  Swelling above or below the fracture  site.  Severe, persistent pain.  Blue or gray skin below the fracture site, especially under the nails. Numbness or loss of feeling below the fracture site. Document Released: 06/09/2005 Document Revised: 05/26/2012 Document Reviewed: 09/21/2008 [facility] Patient Information 2015 [place], [place]. This information is not intended to replace advice given to you by your health care provider. Make sure you discuss any questions you have with your health care provider.

## 2013-12-27 NOTE — ED Provider Notes (Signed)
Medical screening examination/treatment/procedure(s) were performed by resident physician or non-physician practitioner and as supervising physician I was immediately available for consultation/collaboration.   Pauline Good MD.   Billy Fischer, MD 12/27/13 1415

## 2013-12-27 NOTE — ED Notes (Addendum)
Pt c/o right shoulder inj onset this am around 1000 Reports she was at an exercise class when she tripped and fell onto hard flooring Pain radiates to humerus bone; 7/10 and increases w/movement Hx of osteoporosis  Denies head inj/LOC Alert w/no signs of acute distress.

## 2013-12-27 NOTE — ED Provider Notes (Signed)
CSN: [id number]     Arrival date & time 12/27/13  1021 History   First MD Initiated Contact with Patient 12/27/13 1041     Chief Complaint  Patient presents with  . Shoulder Injury   (Consider location/radiation/quality/duration/timing/severity/associated sxs/prior Treatment) HPI Comments: 80 year old female with history of osteopetrosis presents for evaluation of right arm and shoulder pain after a fall. She was exercising at home earlier when she tripped on a rug and fell down directly onto her right shoulder. Now she can barely move her arm and has pain radiating from her shoulder down toward her elbow. There is no numbness in the arm. She has no previous history of shoulder injuries. No other injury  Patient is a 80 y.o. female presenting with shoulder injury.  Shoulder Injury    Past Medical History  Diagnosis Date  . Osteopetrosis   . Renal cancer   . S/p nephrectomy   . Hemorrhoids   . Hypertension    Past Surgical History  Procedure Laterality Date  . Abdominal surgery  2012    for renal carcinoma tumor  . Abdominal hysterectomy    . Colonoscopy  12/29/2011    Procedure: COLONOSCOPY;  Surgeon: Inda Castle, MD;  Location: [facility];  Service: Endoscopy;  Laterality: N/A;   Family History  Problem Relation Age of Onset  . Colon cancer Neg Hx   . Kidney cancer Mother    History  Substance Use Topics  . Smoking status: Former Smoker -- 1.00 packs/day    Quit date: 12/29/1983  . Smokeless tobacco: Never Used  . Alcohol Use: 8.4 oz/week    14 Glasses of wine per week     Comment: 1-2 glasses of wine/day   OB History   Grav Para Term Preterm Abortions TAB SAB Ect Mult Living                 Review of Systems  Musculoskeletal:       See history of present illness  Neurological: Negative for numbness.  All other systems reviewed and are negative.   Allergies  Review of patient's allergies indicates no known allergies.  Home Medications   Prior to  Admission medications   Medication Sig Start Date End Date Taking? Authorizing Provider  losartan-hydrochlorothiazide (HYZAAR) 100-12.5 MG per tablet Take by mouth as directed. 1/4 tablet   Yes Historical Provider, MD  amoxicillin-clavulanate (AUGMENTIN) 500-125 MG per tablet Take 1 tab twice daily with Breakfast and dinner. 01/02/12   Amy S Esterwood, PA-C  HYDROcodone-acetaminophen (NORCO) 5-325 MG per tablet Take 1-2 tablets by mouth every 4 (four) hours as needed for moderate pain. 12/27/13   Freeman Caldron Tadeo Besecker, PA-C   BP 161/78  Pulse 71  Temp(Src) 98.6 F (37 C) (Oral)  Resp 20  SpO2 98% Physical Exam  Nursing note and vitals reviewed. Constitutional: She is oriented to person, place, and time. Vital signs are normal. She appears well-developed and well-nourished. No distress.  HENT:  Head: Normocephalic and atraumatic.  Cardiovascular:  Pulses:      Radial pulses are 2+ on the right side.  Pulmonary/Chest: Effort normal. No respiratory distress.  Musculoskeletal:  In the right shoulder, there is Extremely limited range of motion due to pain. There is no decreased sensation. There is tenderness along the lateral portion of the shoulder and the humerus. There are no other areas of tenderness.  Neurological: She is alert and oriented to person, place, and time. She has normal strength. Coordination normal.  Skin:  Skin is warm and dry. No rash noted. She is not diaphoretic.  Psychiatric: She has a normal mood and affect. Judgment normal.    ED Course  Procedures (including critical care time) Labs Review Labs Reviewed - No data to display  Imaging Review Dg Humerus Right  12/27/2013   CLINICAL DATA:  Fall.  EXAM: RIGHT HUMERUS - 2+ VIEW  COMPARISON:  None.  FINDINGS: Subcapital angulated fracture right humerus is present. Degenerative change present of the right shoulder. Diffuse osteopenia present.  IMPRESSION: Subcapital angulated fracture proximal right humerus.   Electronically  Signed   By: Marcello Moores  Register   On: 12/27/2013 11:25     MDM   1. Humerus fracture, right, closed, initial encounter    Proximal right humerus fracture. She has already called her orthopedist and has an appointment for this afternoon. Placing in a shoulder sling, pain control, will followup with her orthopedist.  Meds ordered this encounter  Medications  . HYDROcodone-acetaminophen (NORCO/VICODIN) 5-325 MG per tablet 1 tablet    Sig:   . HYDROcodone-acetaminophen (NORCO) 5-325 MG per tablet    Sig: Take 1-2 tablets by mouth every 4 (four) hours as needed for moderate pain.    Dispense:  30 tablet    Refill:  0    Order Specific Question:  Supervising Provider    Answer:  Ihor Gully D [[id number]]       Liam Graham, PA-C 12/27/13 1309

## 2013-12-28 ENCOUNTER — Other Ambulatory Visit: Payer: Self-pay | Admitting: Orthopedic Surgery

## 2013-12-28 ENCOUNTER — Ambulatory Visit
Admission: RE | Admit: 2013-12-28 | Discharge: 2013-12-28 | Disposition: A | Discharge status: Home / Self Care | Payer: Medicare Other | Source: Ambulatory Visit | Attending: Orthopedic Surgery | Admitting: Orthopedic Surgery

## 2013-12-28 DIAGNOSIS — M25512 Pain in left shoulder: Secondary | ICD-10-CM

## 2014-02-21 DIAGNOSIS — B999 Unspecified infectious disease: Secondary | ICD-10-CM

## 2014-02-21 HISTORY — DX: Unspecified infectious disease: B99.9

## 2014-03-07 ENCOUNTER — Other Ambulatory Visit (HOSPITAL_COMMUNITY): Payer: Self-pay | Admitting: Orthopedic Surgery

## 2014-03-10 ENCOUNTER — Encounter (HOSPITAL_COMMUNITY): Payer: Self-pay | Admitting: Pharmacy Technician

## 2014-03-13 ENCOUNTER — Other Ambulatory Visit (HOSPITAL_COMMUNITY): Payer: Self-pay | Admitting: *Deleted

## 2014-03-13 NOTE — Pre-Procedure Instructions (Addendum)
Nicole Preston  03/13/2014   Your procedure is scheduled on:  Tuesday, March 21, 2014 .   Report to [facility] Entrance "A" Admitting Office at 5:30 AM.   Call this number if you have problems the morning of surgery: [phone]   Remember:   Do not eat food or drink liquids after midnight Monday, 03/21/14.   Take these medicines the morning of surgery with A SIP OF WATER: HYDROcodone-acetaminophen (NORCO/VICODIN) - if needed      Take regular meds until day of surgery except as instructed below or per dr    Bridgette Habermann all herbel meds, nsaids (aleve,naproxen,advil,ibuprofen) 5 days prior to surgery(03/16/14) including vitamins, aspirin    Do not wear jewelry, make-up or nail polish.  Do not wear lotions, powders, or perfumes. You may NOT wear deodorant.  Do not shave 48 hours prior to surgery.   Do not bring valuables to the hospital.  [facility] is not responsible                  for any belongings or valuables.               Contacts, dentures or bridgework may not be worn into surgery.  Leave suitcase in the car. After surgery it may be brought to your room.  For patients admitted to the hospital, discharge time is determined by your                treatment team.           Special Instructions: [place] - Preparing for Surgery  Before surgery, you can play an important role.  Because skin is not sterile, your skin needs to be as free of germs as possible.  You can reduce the number of germs on you skin by washing with CHG (chlorahexidine gluconate) soap before surgery.  CHG is an antiseptic cleaner which kills germs and bonds with the skin to continue killing germs even after washing.  Please DO NOT use if you have an allergy to CHG or antibacterial soaps.  If your skin becomes reddened/irritated stop using the CHG and inform your nurse when you arrive at Short Stay.  Do not shave (including legs and underarms) for at least 48 hours prior to the first CHG shower.  You  may shave your face.  Please follow these instructions carefully:   1.  Shower with CHG Soap the night before surgery and the                                morning of Surgery.  2.  If you choose to wash your hair, wash your hair first as usual with your       normal shampoo.  3.  After you shampoo, rinse your hair and body thoroughly to remove the                      Shampoo.  4.  Use CHG as you would any other liquid soap.  You can apply chg directly       to the skin and wash gently with scrungie or a clean washcloth.  5.  Apply the CHG Soap to your body ONLY FROM THE NECK DOWN.        Do not use on open wounds or open sores.  Avoid contact with your eyes, ears, mouth and genitals (private parts).  Wash  genitals (private parts) with your normal soap.  6.  Wash thoroughly, paying special attention to the area where your surgery        will be performed.  7.  Thoroughly rinse your body with warm water from the neck down.  8.  DO NOT shower/wash with your normal soap after using and rinsing off       the CHG Soap.  9.  Pat yourself dry with a clean towel.            10.  Wear clean pajamas.            11.  Place clean sheets on your bed the night of your first shower and do not        sleep with pets.  Day of Surgery  Do not apply any lotions/deodorants the morning of surgery.  Please wear clean clothes to the hospital/surgery center.     Please read over the following fact sheets that you were given: Pain Booklet, Coughing and Deep Breathing, Blood Transfusion Information and Surgical Site Infection Prevention

## 2014-03-14 ENCOUNTER — Encounter (HOSPITAL_COMMUNITY)
Admission: RE | Admit: 2014-03-14 | Discharge: 2014-03-14 | Disposition: A | Discharge status: Home / Self Care | Payer: Medicare Other | Source: Ambulatory Visit | Attending: Orthopedic Surgery | Admitting: Orthopedic Surgery

## 2014-03-14 ENCOUNTER — Encounter (HOSPITAL_COMMUNITY): Payer: Self-pay

## 2014-03-14 DIAGNOSIS — Z9889 Other specified postprocedural states: Secondary | ICD-10-CM | POA: Diagnosis not present

## 2014-03-14 DIAGNOSIS — J438 Other emphysema: Secondary | ICD-10-CM | POA: Insufficient documentation

## 2014-03-14 DIAGNOSIS — S42209A Unspecified fracture of upper end of unspecified humerus, initial encounter for closed fracture: Secondary | ICD-10-CM | POA: Diagnosis not present

## 2014-03-14 DIAGNOSIS — Z01811 Encounter for preprocedural respiratory examination: Secondary | ICD-10-CM | POA: Diagnosis present

## 2014-03-14 DIAGNOSIS — Z905 Acquired absence of kidney: Secondary | ICD-10-CM | POA: Insufficient documentation

## 2014-03-14 DIAGNOSIS — C649 Malignant neoplasm of unspecified kidney, except renal pelvis: Secondary | ICD-10-CM | POA: Diagnosis not present

## 2014-03-14 DIAGNOSIS — X58XXXA Exposure to other specified factors, initial encounter: Secondary | ICD-10-CM | POA: Diagnosis not present

## 2014-03-14 DIAGNOSIS — Z01810 Encounter for preprocedural cardiovascular examination: Secondary | ICD-10-CM | POA: Insufficient documentation

## 2014-03-14 DIAGNOSIS — Z01812 Encounter for preprocedural laboratory examination: Secondary | ICD-10-CM | POA: Diagnosis present

## 2014-03-14 HISTORY — DX: Age-related osteoporosis without current pathological fracture: M81.0

## 2014-03-14 HISTORY — DX: Unspecified osteoarthritis, unspecified site: M19.90

## 2014-03-14 HISTORY — DX: Chronic obstructive pulmonary disease, unspecified: J44.9

## 2014-03-14 LAB — URINALYSIS, ROUTINE W REFLEX MICROSCOPIC
Bilirubin Urine: NEGATIVE
GLUCOSE, UA: NEGATIVE mg/dL
HGB URINE DIPSTICK: NEGATIVE
KETONES UR: NEGATIVE mg/dL
Nitrite: NEGATIVE
PROTEIN: NEGATIVE mg/dL
Specific Gravity, Urine: 1.008 (ref 1.005–1.030)
UROBILINOGEN UA: 0.2 mg/dL (ref 0.0–1.0)
pH: 7 (ref 5.0–8.0)

## 2014-03-14 LAB — BASIC METABOLIC PANEL
ANION GAP: 12 (ref 5–15)
BUN: 15 mg/dL (ref 6–23)
CHLORIDE: 92 meq/L — AB (ref 96–112)
CO2: 24 mEq/L (ref 19–32)
Calcium: 9.5 mg/dL (ref 8.4–10.5)
Creatinine, Ser: 0.87 mg/dL (ref 0.50–1.10)
GFR, EST AFRICAN AMERICAN: 72 mL/min — AB (ref >90)
GFR, EST NON AFRICAN AMERICAN: 62 mL/min — AB (ref >90)
Glucose, Bld: 79 mg/dL (ref 70–99)
POTASSIUM: 4.3 meq/L (ref 3.7–5.3)
SODIUM: 128 meq/L — AB (ref 137–147)

## 2014-03-14 LAB — CBC
HCT: 39.7 % (ref 36.0–46.0)
HEMOGLOBIN: 13.9 g/dL (ref 12.0–15.0)
MCH: 30.5 pg (ref 26.0–34.0)
MCHC: 35 g/dL (ref 30.0–36.0)
MCV: 87.3 fL (ref 78.0–100.0)
PLATELETS: 307 10*3/uL (ref 150–400)
RBC: 4.55 MIL/uL (ref 3.87–5.11)
RDW: 13.1 % (ref 11.5–15.5)
WBC: 5.4 10*3/uL (ref 4.0–10.5)

## 2014-03-14 LAB — TYPE AND SCREEN
ABO/RH(D): O POS
ANTIBODY SCREEN: NEGATIVE

## 2014-03-14 LAB — URINE MICROSCOPIC-ADD ON

## 2014-03-14 LAB — ABO/RH: ABO/RH(D): O POS

## 2014-03-14 NOTE — Progress Notes (Signed)
req'd discharge summary, anesthesia record, cardiology tests, notes from [facility] from 2012-now(surgery 2012)  req'd office notes, tests, any cardiology tests or notes from pcp dr Rosana Hoes

## 2014-03-15 ENCOUNTER — Other Ambulatory Visit (HOSPITAL_COMMUNITY): Payer: Self-pay | Admitting: Orthopedic Surgery

## 2014-03-15 LAB — URINE CULTURE
Colony Count: NO GROWTH
Culture: NO GROWTH

## 2014-03-15 NOTE — Progress Notes (Signed)
Anesthesia Chart Review:  Pt is 80 year old female scheduled for R shoulder hemiarthroplasty vs total shoulder replacement on 03/21/14 with Dr. Marlou Sa.   PMH: HTN, COPD, renal cancer, s/p nephrectomy, osteopetrosis, osteoporosis. 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   Medications include: benazepril  Preoperative labs reviewed. Na 128, Cl 92. Surgeon ordered BMET to be rechecked.   Chest x-ray reviewed. Shows COPD/emphysema. Calcified granuloma in L upper lobe. No acute cardiopulmonary disease.   EKG: NSR, L axis deviation, LBBB. In 2012, pt had LBBB on EKG on 4/1 but did not on 09/25/10.   TEE 09/23/2010:  EF >10% RV systolic function normal.  Trace mitral and pulmonic regurgitation.  Mild tricuspid and aortic regurgitation.  IVC appears patent.   Discussed with Dr. Orene Desanctis.   If no changes, I anticipate pt can proceed with surgery as scheduled.    Willeen Cass, FNP-BC [facility] Short Stay Surgical Center/Anesthesiology Phone: [phone] 03/15/2014 4:19 PM

## 2014-03-20 MED ORDER — CHLORHEXIDINE GLUCONATE 4 % EX LIQD
60.0000 mL | Freq: Once | CUTANEOUS | Status: DC
  Filled 2014-03-20: qty 60

## 2014-03-20 MED ORDER — CEFAZOLIN SODIUM-DEXTROSE 2-3 GM-% IV SOLR
2.0000 g | INTRAVENOUS | Status: AC
  Administered 2014-03-21: 2 g via INTRAVENOUS
  Filled 2014-03-20: qty 50

## 2014-03-21 ENCOUNTER — Inpatient Hospital Stay (HOSPITAL_COMMUNITY): Payer: Medicare Other | Admitting: Anesthesiology

## 2014-03-21 ENCOUNTER — Encounter (HOSPITAL_COMMUNITY): Payer: Self-pay | Admitting: *Deleted

## 2014-03-21 ENCOUNTER — Encounter (HOSPITAL_COMMUNITY): Payer: Medicare Other | Admitting: Vascular Surgery

## 2014-03-21 ENCOUNTER — Encounter (HOSPITAL_COMMUNITY)
Admission: RE | Disposition: A | Discharge status: Home / Self Care | Payer: Self-pay | Source: Ambulatory Visit | Attending: Orthopedic Surgery

## 2014-03-21 ENCOUNTER — Observation Stay (HOSPITAL_COMMUNITY)
Admission: RE | Admit: 2014-03-21 | Discharge: 2014-03-22 | Disposition: A | Discharge status: Home / Self Care | Payer: Medicare Other | Source: Ambulatory Visit | Attending: Orthopedic Surgery | Admitting: Orthopedic Surgery

## 2014-03-21 DIAGNOSIS — M009 Pyogenic arthritis, unspecified: Secondary | ICD-10-CM | POA: Insufficient documentation

## 2014-03-21 DIAGNOSIS — J449 Chronic obstructive pulmonary disease, unspecified: Secondary | ICD-10-CM | POA: Diagnosis not present

## 2014-03-21 DIAGNOSIS — S42293A Other displaced fracture of upper end of unspecified humerus, initial encounter for closed fracture: Secondary | ICD-10-CM | POA: Diagnosis not present

## 2014-03-21 DIAGNOSIS — S40911A Unspecified superficial injury of right shoulder, initial encounter: Secondary | ICD-10-CM

## 2014-03-21 DIAGNOSIS — L089 Local infection of the skin and subcutaneous tissue, unspecified: Secondary | ICD-10-CM | POA: Diagnosis present

## 2014-03-21 DIAGNOSIS — M810 Age-related osteoporosis without current pathological fracture: Secondary | ICD-10-CM | POA: Diagnosis not present

## 2014-03-21 DIAGNOSIS — Z905 Acquired absence of kidney: Secondary | ICD-10-CM | POA: Insufficient documentation

## 2014-03-21 DIAGNOSIS — X58XXXA Exposure to other specified factors, initial encounter: Secondary | ICD-10-CM | POA: Diagnosis not present

## 2014-03-21 DIAGNOSIS — J4489 Other specified chronic obstructive pulmonary disease: Secondary | ICD-10-CM | POA: Insufficient documentation

## 2014-03-21 DIAGNOSIS — Z85528 Personal history of other malignant neoplasm of kidney: Secondary | ICD-10-CM | POA: Insufficient documentation

## 2014-03-21 DIAGNOSIS — Z5309 Procedure and treatment not carried out because of other contraindication: Secondary | ICD-10-CM | POA: Insufficient documentation

## 2014-03-21 DIAGNOSIS — Z79899 Other long term (current) drug therapy: Secondary | ICD-10-CM | POA: Insufficient documentation

## 2014-03-21 DIAGNOSIS — S40919A Unspecified superficial injury of unspecified shoulder, initial encounter: Secondary | ICD-10-CM

## 2014-03-21 DIAGNOSIS — Z87891 Personal history of nicotine dependence: Secondary | ICD-10-CM | POA: Insufficient documentation

## 2014-03-21 DIAGNOSIS — I10 Essential (primary) hypertension: Secondary | ICD-10-CM | POA: Insufficient documentation

## 2014-03-21 DIAGNOSIS — S40911D Unspecified superficial injury of right shoulder, subsequent encounter: Secondary | ICD-10-CM

## 2014-03-21 DIAGNOSIS — C649 Malignant neoplasm of unspecified kidney, except renal pelvis: Secondary | ICD-10-CM

## 2014-03-21 HISTORY — PX: IRRIGATION AND DEBRIDEMENT SHOULDER: SHX5880

## 2014-03-21 HISTORY — DX: Unspecified infectious disease: B99.9

## 2014-03-21 HISTORY — PX: BICEPT TENODESIS: SHX5116

## 2014-03-21 LAB — GRAM STAIN

## 2014-03-21 LAB — BASIC METABOLIC PANEL
Anion gap: 13 (ref 5–15)
BUN: 12 mg/dL (ref 6–23)
CALCIUM: 9.5 mg/dL (ref 8.4–10.5)
CO2: 24 mEq/L (ref 19–32)
CREATININE: 0.91 mg/dL (ref 0.50–1.10)
Chloride: 91 mEq/L — ABNORMAL LOW (ref 96–112)
GFR calc non Af Amer: 59 mL/min — ABNORMAL LOW (ref >90)
GFR, EST AFRICAN AMERICAN: 68 mL/min — AB (ref >90)
Glucose, Bld: 92 mg/dL (ref 70–99)
Potassium: 4.8 mEq/L (ref 3.7–5.3)
SODIUM: 128 meq/L — AB (ref 137–147)

## 2014-03-21 LAB — SYNOVIAL FLUID, CRYSTAL
CRYSTALS FLUID: NONE SEEN
Crystals, Fluid: NONE SEEN

## 2014-03-21 SURGERY — IRRIGATION AND DEBRIDEMENT SHOULDER
Anesthesia: General | Site: Shoulder | Laterality: Right

## 2014-03-21 MED ORDER — BUPIVACAINE-EPINEPHRINE (PF) 0.5% -1:200000 IJ SOLN
INTRAMUSCULAR | Status: AC
  Filled 2014-03-21: qty 30

## 2014-03-21 MED ORDER — ROCURONIUM BROMIDE 50 MG/5ML IV SOLN
INTRAVENOUS | Status: AC
  Filled 2014-03-21: qty 1

## 2014-03-21 MED ORDER — ONDANSETRON HCL 4 MG PO TABS: 4.0000 mg | ORAL_TABLET | Freq: Four times a day (QID) | ORAL | Status: DC | PRN

## 2014-03-21 MED ORDER — GLYCOPYRROLATE 0.2 MG/ML IJ SOLN
INTRAMUSCULAR | Status: AC
  Filled 2014-03-21: qty 1

## 2014-03-21 MED ORDER — PHENYLEPHRINE 40 MCG/ML (10ML) SYRINGE FOR IV PUSH (FOR BLOOD PRESSURE SUPPORT)
PREFILLED_SYRINGE | INTRAVENOUS | Status: AC
  Filled 2014-03-21: qty 10

## 2014-03-21 MED ORDER — DEXTROSE 5 % IV SOLN
2.0000 g | INTRAVENOUS | Status: DC
  Administered 2014-03-22: 2 g via INTRAVENOUS
  Filled 2014-03-21 (×2): qty 2

## 2014-03-21 MED ORDER — VANCOMYCIN HCL 500 MG IV SOLR
500.0000 mg | Freq: Two times a day (BID) | INTRAVENOUS | Status: DC
  Administered 2014-03-21 – 2014-03-22 (×3): 500 mg via INTRAVENOUS
  Filled 2014-03-21 (×4): qty 500

## 2014-03-21 MED ORDER — PHENYLEPHRINE HCL 10 MG/ML IJ SOLN
10.0000 mg | INTRAMUSCULAR | Status: DC | PRN
  Administered 2014-03-21: 50 ug/min via INTRAVENOUS

## 2014-03-21 MED ORDER — OXYCODONE HCL 5 MG PO TABS: 5.0000 mg | ORAL_TABLET | Freq: Once | ORAL | Status: DC | PRN

## 2014-03-21 MED ORDER — ASPIRIN 325 MG PO TABS
325.0000 mg | ORAL_TABLET | Freq: Every day | ORAL | Status: DC
  Administered 2014-03-21 – 2014-03-22 (×2): 325 mg via ORAL
  Filled 2014-03-21 (×2): qty 1

## 2014-03-21 MED ORDER — 0.9 % SODIUM CHLORIDE (POUR BTL) OPTIME
TOPICAL | Status: DC | PRN
  Administered 2014-03-21: 3000 mL

## 2014-03-21 MED ORDER — PROPOFOL 10 MG/ML IV BOLUS
INTRAVENOUS | Status: AC
  Filled 2014-03-21: qty 20

## 2014-03-21 MED ORDER — PROMETHAZINE HCL 25 MG/ML IJ SOLN: 6.2500 mg | INTRAMUSCULAR | Status: DC | PRN

## 2014-03-21 MED ORDER — METOCLOPRAMIDE HCL 5 MG PO TABS
5.0000 mg | ORAL_TABLET | Freq: Three times a day (TID) | ORAL | Status: DC | PRN
  Filled 2014-03-21: qty 2

## 2014-03-21 MED ORDER — DEXAMETHASONE SODIUM PHOSPHATE 4 MG/ML IJ SOLN
INTRAMUSCULAR | Status: DC | PRN
  Administered 2014-03-21: 4 mg via INTRAVENOUS

## 2014-03-21 MED ORDER — SODIUM CHLORIDE 0.9 % IJ SOLN
INTRAMUSCULAR | Status: AC
  Filled 2014-03-21: qty 10

## 2014-03-21 MED ORDER — DEXAMETHASONE SODIUM PHOSPHATE 4 MG/ML IJ SOLN
INTRAMUSCULAR | Status: AC
  Filled 2014-03-21: qty 1

## 2014-03-21 MED ORDER — MIDAZOLAM HCL 5 MG/5ML IJ SOLN
INTRAMUSCULAR | Status: DC | PRN
  Administered 2014-03-21: 1 mg via INTRAVENOUS

## 2014-03-21 MED ORDER — BENAZEPRIL HCL 10 MG PO TABS
10.0000 mg | ORAL_TABLET | Freq: Every day | ORAL | Status: DC
  Administered 2014-03-22: 10 mg via ORAL
  Filled 2014-03-21 (×2): qty 1

## 2014-03-21 MED ORDER — OXYCODONE HCL 5 MG/5ML PO SOLN: 5.0000 mg | Freq: Once | ORAL | Status: DC | PRN

## 2014-03-21 MED ORDER — PROPOFOL 10 MG/ML IV BOLUS
INTRAVENOUS | Status: DC | PRN
  Administered 2014-03-21: 130 mg via INTRAVENOUS
  Administered 2014-03-21: 20 mg via INTRAVENOUS

## 2014-03-21 MED ORDER — ACETAMINOPHEN 325 MG PO TABS
650.0000 mg | ORAL_TABLET | Freq: Four times a day (QID) | ORAL | Status: DC | PRN
  Administered 2014-03-22: 650 mg via ORAL
  Filled 2014-03-21: qty 2

## 2014-03-21 MED ORDER — LACTATED RINGERS IV SOLN
INTRAVENOUS | Status: DC | PRN
  Administered 2014-03-21 (×2): via INTRAVENOUS

## 2014-03-21 MED ORDER — GLYCOPYRROLATE 0.2 MG/ML IJ SOLN
INTRAMUSCULAR | Status: DC | PRN
  Administered 2014-03-21: 0.3 mg via INTRAVENOUS

## 2014-03-21 MED ORDER — FENTANYL CITRATE 0.05 MG/ML IJ SOLN
INTRAMUSCULAR | Status: DC | PRN
  Administered 2014-03-21 (×2): 25 ug via INTRAVENOUS
  Administered 2014-03-21: 50 ug via INTRAVENOUS
  Administered 2014-03-21: 25 ug via INTRAVENOUS

## 2014-03-21 MED ORDER — FENTANYL CITRATE 0.05 MG/ML IJ SOLN
INTRAMUSCULAR | Status: AC
  Filled 2014-03-21: qty 5

## 2014-03-21 MED ORDER — BUPIVACAINE-EPINEPHRINE (PF) 0.5% -1:200000 IJ SOLN
INTRAMUSCULAR | Status: DC | PRN
  Administered 2014-03-21: 20 mL via PERINEURAL

## 2014-03-21 MED ORDER — ONDANSETRON HCL 4 MG/2ML IJ SOLN: 4.0000 mg | Freq: Four times a day (QID) | INTRAMUSCULAR | Status: DC | PRN

## 2014-03-21 MED ORDER — NEOSTIGMINE METHYLSULFATE 10 MG/10ML IV SOLN
INTRAVENOUS | Status: DC | PRN
  Administered 2014-03-21: 2.5 mg via INTRAVENOUS

## 2014-03-21 MED ORDER — ONDANSETRON HCL 4 MG/2ML IJ SOLN
INTRAMUSCULAR | Status: DC | PRN
  Administered 2014-03-21 (×2): 4 mg via INTRAVENOUS

## 2014-03-21 MED ORDER — ACETAMINOPHEN 650 MG RE SUPP: 650.0000 mg | Freq: Four times a day (QID) | RECTAL | Status: DC | PRN

## 2014-03-21 MED ORDER — GENTAMICIN SULFATE 40 MG/ML IJ SOLN
INTRAMUSCULAR | Status: DC | PRN
  Administered 2014-03-21: 80 mg

## 2014-03-21 MED ORDER — HYDROCODONE-ACETAMINOPHEN 5-325 MG PO TABS
1.0000 | ORAL_TABLET | ORAL | Status: DC | PRN
  Administered 2014-03-21 – 2014-03-22 (×4): 2 via ORAL
  Filled 2014-03-21 (×5): qty 2

## 2014-03-21 MED ORDER — MENTHOL 3 MG MT LOZG: 1.0000 | LOZENGE | OROMUCOSAL | Status: DC | PRN

## 2014-03-21 MED ORDER — VANCOMYCIN HCL 500 MG IV SOLR
INTRAVENOUS | Status: AC
  Filled 2014-03-21: qty 500

## 2014-03-21 MED ORDER — GENTAMICIN SULFATE 40 MG/ML IJ SOLN
INTRAMUSCULAR | Status: AC
  Filled 2014-03-21: qty 4

## 2014-03-21 MED ORDER — EPHEDRINE SULFATE 50 MG/ML IJ SOLN
INTRAMUSCULAR | Status: AC
  Filled 2014-03-21: qty 1

## 2014-03-21 MED ORDER — LIDOCAINE HCL (CARDIAC) 20 MG/ML IV SOLN
INTRAVENOUS | Status: DC | PRN
  Administered 2014-03-21: 40 mg via INTRAVENOUS
  Administered 2014-03-21: 60 mg via INTRAVENOUS

## 2014-03-21 MED ORDER — ROCURONIUM BROMIDE 100 MG/10ML IV SOLN
INTRAVENOUS | Status: DC | PRN
  Administered 2014-03-21: 40 mg via INTRAVENOUS

## 2014-03-21 MED ORDER — VANCOMYCIN HCL 500 MG IV SOLR
INTRAVENOUS | Status: DC | PRN
  Administered 2014-03-21: 500 mg

## 2014-03-21 MED ORDER — DIPHENHYDRAMINE HCL 25 MG PO TABS: 12.5000 mg | ORAL_TABLET | Freq: Every evening | ORAL | Status: DC | PRN

## 2014-03-21 MED ORDER — VITAMIN D3 25 MCG (1000 UNIT) PO TABS
1000.0000 [IU] | ORAL_TABLET | Freq: Two times a day (BID) | ORAL | Status: DC
  Administered 2014-03-21 – 2014-03-22 (×2): 1000 [IU] via ORAL
  Filled 2014-03-21 (×4): qty 1

## 2014-03-21 MED ORDER — LIDOCAINE HCL (CARDIAC) 20 MG/ML IV SOLN
INTRAVENOUS | Status: AC
  Filled 2014-03-21: qty 5

## 2014-03-21 MED ORDER — SUCCINYLCHOLINE CHLORIDE 20 MG/ML IJ SOLN
INTRAMUSCULAR | Status: AC
  Filled 2014-03-21: qty 1

## 2014-03-21 MED ORDER — METOCLOPRAMIDE HCL 5 MG/ML IJ SOLN: 5.0000 mg | Freq: Three times a day (TID) | INTRAMUSCULAR | Status: DC | PRN

## 2014-03-21 MED ORDER — MIDAZOLAM HCL 2 MG/2ML IJ SOLN
INTRAMUSCULAR | Status: AC
  Filled 2014-03-21: qty 2

## 2014-03-21 MED ORDER — CEFAZOLIN SODIUM 1-5 GM-% IV SOLN
1.0000 g | Freq: Four times a day (QID) | INTRAVENOUS | Status: DC
  Filled 2014-03-21 (×3): qty 50

## 2014-03-21 MED ORDER — PHENOL 1.4 % MT LIQD: 1.0000 | OROMUCOSAL | Status: DC | PRN

## 2014-03-21 MED ORDER — ZOLPIDEM TARTRATE 5 MG PO TABS
5.0000 mg | ORAL_TABLET | Freq: Every evening | ORAL | Status: DC | PRN
  Administered 2014-03-21: 5 mg via ORAL
  Filled 2014-03-21: qty 1

## 2014-03-21 MED ORDER — HYDROMORPHONE HCL 1 MG/ML IJ SOLN: 0.2500 mg | INTRAMUSCULAR | Status: DC | PRN

## 2014-03-21 SURGICAL SUPPLY — 59 items
APL SKNCLS STERI-STRIP NONHPOA (GAUZE/BANDAGES/DRESSINGS) ×1
BENZOIN TINCTURE PRP APPL 2/3 (GAUZE/BANDAGES/DRESSINGS) ×2 IMPLANT
BIT DRILL QUICK REL 1/8 2PK SL (DRILL) IMPLANT
BLADE SAW SGTL 13X75X1.27 (BLADE) ×3 IMPLANT
CLOSURE STERI-STRIP 1/2X4 (GAUZE/BANDAGES/DRESSINGS) ×1
CLSR STERI-STRIP ANTIMIC 1/2X4 (GAUZE/BANDAGES/DRESSINGS) ×1 IMPLANT
COVER SURGICAL LIGHT HANDLE (MISCELLANEOUS) ×3 IMPLANT
COVER TABLE BACK 60X90 (DRAPES) IMPLANT
DRAPE C-ARM 42X72 X-RAY (DRAPES) ×2 IMPLANT
DRAPE INCISE IOBAN 66X45 STRL (DRAPES) ×6 IMPLANT
DRAPE U-SHAPE 47X51 STRL (DRAPES) ×6 IMPLANT
DRILL QUICK RELEASE 1/8 INCH (DRILL) ×2
DRSG AQUACEL AG ADV 3.5X10 (GAUZE/BANDAGES/DRESSINGS) ×3 IMPLANT
DRSG PAD ABDOMINAL 8X10 ST (GAUZE/BANDAGES/DRESSINGS) ×2 IMPLANT
DURAPREP 26ML APPLICATOR (WOUND CARE) ×3 IMPLANT
ELECT BLADE 6.5 EXT (BLADE) ×2 IMPLANT
ELECT REM PT RETURN 9FT ADLT (ELECTROSURGICAL) ×3
ELECTRODE REM PT RTRN 9FT ADLT (ELECTROSURGICAL) ×1 IMPLANT
EVACUATOR 1/8 PVC DRAIN (DRAIN) IMPLANT
GAUZE SPONGE 4X4 12PLY STRL (GAUZE/BANDAGES/DRESSINGS) ×1 IMPLANT
GLOVE BIOGEL PI IND STRL 7.5 (GLOVE) ×1 IMPLANT
GLOVE BIOGEL PI IND STRL 8 (GLOVE) ×1 IMPLANT
GLOVE BIOGEL PI INDICATOR 7.5 (GLOVE) ×2
GLOVE BIOGEL PI INDICATOR 8 (GLOVE) ×2
GLOVE ECLIPSE 7.0 STRL STRAW (GLOVE) ×3 IMPLANT
GLOVE SURG ORTHO 8.0 STRL STRW (GLOVE) ×3 IMPLANT
GOWN STRL REUS W/ TWL LRG LVL3 (GOWN DISPOSABLE) ×2 IMPLANT
GOWN STRL REUS W/ TWL XL LVL3 (GOWN DISPOSABLE) ×1 IMPLANT
GOWN STRL REUS W/TWL LRG LVL3 (GOWN DISPOSABLE) ×6
GOWN STRL REUS W/TWL XL LVL3 (GOWN DISPOSABLE) ×3
KIT BASIN OR (CUSTOM PROCEDURE TRAY) ×3 IMPLANT
KIT ROOM TURNOVER OR (KITS) ×3 IMPLANT
KIT STIMULAN RAPID CURE 5CC (Orthopedic Implant) ×2 IMPLANT
MANIFOLD NEPTUNE II (INSTRUMENTS) ×3 IMPLANT
NDL HYPO 25GX1X1/2 BEV (NEEDLE) IMPLANT
NDL SUT 6 .5 CRC .975X.05 MAYO (NEEDLE) ×1 IMPLANT
NEEDLE HYPO 25GX1X1/2 BEV (NEEDLE) ×3 IMPLANT
NEEDLE MAYO TAPER (NEEDLE) ×3
NS IRRIG 1000ML POUR BTL (IV SOLUTION) ×7 IMPLANT
PACK SHOULDER (CUSTOM PROCEDURE TRAY) ×3 IMPLANT
PAD ARMBOARD 7.5X6 YLW CONV (MISCELLANEOUS) ×6 IMPLANT
SPONGE LAP 18X18 X RAY DECT (DISPOSABLE) ×3 IMPLANT
SUCTION FRAZIER TIP 10 FR DISP (SUCTIONS) ×1 IMPLANT
SUT FIBERWIRE #2 38 T-5 BLUE (SUTURE) ×9
SUT MAXBRAID (SUTURE) ×4 IMPLANT
SUT PROLENE 3 0 PS 2 (SUTURE) ×3 IMPLANT
SUT VIC AB 0 CTB1 27 (SUTURE) ×6 IMPLANT
SUT VIC AB 1 CT1 27 (SUTURE) ×3
SUT VIC AB 1 CT1 27XBRD ANBCTR (SUTURE) ×1 IMPLANT
SUT VIC AB 2-0 CT1 27 (SUTURE) ×9
SUT VIC AB 2-0 CT1 TAPERPNT 27 (SUTURE) ×3 IMPLANT
SUTURE FIBERWR #2 38 T-5 BLUE (SUTURE) ×1 IMPLANT
SWAB COLLECTION DEVICE MRSA (MISCELLANEOUS) ×4 IMPLANT
SYR CONTROL 10ML LL (SYRINGE) ×1 IMPLANT
TOWEL OR 17X24 6PK STRL BLUE (TOWEL DISPOSABLE) ×3 IMPLANT
TOWEL OR 17X26 10 PK STRL BLUE (TOWEL DISPOSABLE) ×3 IMPLANT
TRAY FOLEY BAG SILVER LF 16FR (CATHETERS) ×2 IMPLANT
TUBE ANAEROBIC SPECIMEN COL (MISCELLANEOUS) ×4 IMPLANT
WATER STERILE IRR 1000ML POUR (IV SOLUTION) ×1 IMPLANT

## 2014-03-21 NOTE — Addendum Note (Signed)
Addendum created 03/21/14 1342 by Jenne Campus, CRNA   Modules edited: OB and Lactation Status, Problem List   Problem List:  [place]ta [place] As Reviewed

## 2014-03-21 NOTE — Progress Notes (Signed)
ANTIBIOTIC CONSULT NOTE - INITIAL  Pharmacy Consult for Vancomycin Indication: osteomyelitis  No Known Allergies  Patient Measurements:   Body Weight: 63.4 kg  Vital Signs: Temp: 97.5 F (36.4 C) (09/29 1445) Temp src: Oral (09/29 1147) BP: 121/42 mmHg (09/29 1445) Pulse Rate: 57 (09/29 1445) Intake/Output from previous day:   Intake/Output from this shift: Total I/O In: 2440 [P.O.:740; I.V.:1700] Out: 500 [Urine:350; Blood:150]  Labs:  Recent Labs  03/21/14 0554  CREATININE 0.91   The CrCl is unknown because both a height and weight (above a minimum accepted value) are required for this calculation. No results found for this basename: VANCOTROUGH, Corlis Leak, VANCORANDOM, [place], [place], [place], [place], [place], TOBRARND, AMIKACINPEAK, AMIKACINTROU, AMIKACIN,  in the last 72 hours   Microbiology: Recent Results (from the past 720 hour(s))  URINE CULTURE     Status: None   Collection Time    03/14/14 10:24 AM      Result Value Ref Range Status   Specimen Description URINE, CLEAN CATCH   Final   Special Requests NONE   Final   Culture  Setup Time     Final   Value: 03/14/2014 16:48     Performed at [place]     Final   Value: NO GROWTH     Performed at [facility]   Culture     Final   Value: NO GROWTH     Performed at [facility]   Report Status 03/15/2014 FINAL   Final  GRAM STAIN     Status: None   Collection Time    03/21/14  8:40 AM      Result Value Ref Range Status   Specimen Description FLUID SHOU RIGHT SYNOVIAL   Final   Special Requests RIGHT SHOULDER BURSA NO 1   Final   Gram Stain     Final   Value: FEW WBC PRESENT,BOTH PMN AND MONONUCLEAR     NO ORGANISMS SEEN     CALLED TO DR Marlou Sa 03/21/14 0927 COSTELLO B   Report Status 03/21/2014 FINAL   Final  GRAM STAIN     Status: None   Collection Time    03/21/14  8:40 AM      Result Value Ref Range Status   Specimen Description TISSUE    Final   Special Requests NONE RIGHT SHOULDER BURSA TISSUE   Final   Gram Stain     Final   Value: MODERATE WBC PRESENT,BOTH PMN AND MONONUCLEAR     NO ORGANISMS SEEN     CALLED TO DR Marlou Sa 03/21/14 1000 COSTELLOO B   Report Status 03/21/2014 FINAL   Final  GRAM STAIN     Status: None   Collection Time    03/21/14  8:50 AM      Result Value Ref Range Status   Specimen Description FLUID SHOULDER RIGHT SYNOVIAL   Final   Special Requests RIGHT SHOULDER JOINT   Final   Gram Stain     Final   Value: RARE WBC PRESENT,BOTH PMN AND MONONUCLEAR     NO ORGANISMS SEEN     CALLED TO  DR Marlou Sa 03/21/14 0932 COSTELLO B   Report Status 03/21/2014 FINAL   Final    Medical History: Past Medical History  Diagnosis Date  . Osteopetrosis   . S/p nephrectomy   . Hemorrhoids   . Hypertension   . COPD (chronic obstructive pulmonary disease)   . Renal cancer 4/12  rt nephrectomy  . Arthritis   . Osteoporosis     Assessment: 80 yo female with hx osteoporosis, R shoulder fracture after fall 12/27/13.  Pharmacy asked to begin vancomycin for osteomyelitis.  Scr 0.91, Est CrCl ~ 53 ml/min.  Goal of Therapy:  Vancomycin trough level 15-20 mcg/ml  Plan:  1. Vancomycin 500 mg IV q 12 hrs. 2. Will f/u cultures and renal function. 3. Vancomycin trough at steady state as necessary.  Uvaldo Rising, BCPS  Clinical Pharmacist Pager [phone]  03/21/2014 4:33 PM

## 2014-03-21 NOTE — Consult Note (Signed)
[facility] for Infectious Disease  Date of Admission:  03/21/2014  Date of Consult:  03/21/2014  Reason for Consult: Septic Arthritis Referring Physician: Marlou Sa  Impression/Recommendation Septic Arthritis Osteoperosis Renal Cell Ca (resected [facility])  Will Place [facility] Start her on vanco/ceftriaxone Await Cx Plan to defer prosthesis until she has completed anbx?  Thank you so much for this interesting consult,   Bobby Rumpf (pager) [phone] [email or web address]  Nicole Preston is an 80 y.o. female.  HPI: 80 yo F with hx of osteoporosis, R shoulder fracture after a fall 12-27-13.  She failed non-operative management and presented 9-29 for repair of her shoulder. She noted increasing swelling of her R shoulder and "bony" feeling. When taken to the OR, she was found to have a fluid collection anterior to her shoulder joint. Her shoulder repair was deffered and she had anbx beads placed.   Past Medical History  Diagnosis Date  . Osteopetrosis   . S/p nephrectomy   . Hemorrhoids   . Hypertension   . COPD (chronic obstructive pulmonary disease)   . Renal cancer 4/12    rt nephrectomy  . Arthritis   . Osteoporosis     Past Surgical History  Procedure Laterality Date  . Abdominal surgery  2012    for renal carcinoma tumor  . Abdominal hysterectomy    . Colonoscopy  12/29/2011    Procedure: COLONOSCOPY;  Surgeon: Inda Castle, MD;  Location: [facility];  Service: Endoscopy;  Laterality: N/A;     No Known Allergies  Medications:  Scheduled: . aspirin  325 mg Oral Daily  . benazepril  10 mg Oral Daily  .  ceFAZolin (ANCEF) IV  1 g Intravenous Q6H  . cholecalciferol  1,000 Units Oral BID    Abtx:  Anti-infectives   Start     Dose/Rate Route Frequency Ordered Stop   03/21/14 1330  ceFAZolin (ANCEF) IVPB 1 g/50 mL premix     1 g 100 mL/hr over 30 Minutes Intravenous Every 6 hours 03/21/14 1153 03/22/14 0729   03/21/14 0955  gentamicin (GARAMYCIN) injection   Status:  Discontinued       As needed 03/21/14 0957 03/21/14 1031   03/21/14 0948  vancomycin (VANCOCIN) powder  Status:  Discontinued       As needed 03/21/14 0958 03/21/14 1031   03/21/14 0600  ceFAZolin (ANCEF) IVPB 2 g/50 mL premix     2 g 100 mL/hr over 30 Minutes Intravenous On call to O.R. 03/20/14 1403 03/21/14 0741      Total days of antibiotics 0          Social History:  reports that she quit smoking about 30 years ago. Her smoking use included Cigarettes. She smoked 1.00 pack per day. She has never used smokeless tobacco. She reports that she drinks about 8.4 ounces of alcohol per week. She reports that she does not use illicit drugs.  Family History  Problem Relation Age of Onset  . Colon cancer Neg Hx   . Kidney cancer Mother     General ROS: no f/c, no hx of trauma or injury, see HPI.   Blood pressure 121/42, pulse 57, temperature 97.5 F (36.4 C), temperature source Oral, resp. rate 14, SpO2 100.00%. General appearance: alert, cooperative and no distress Eyes: negative findings: conjunctivae and sclerae normal and pupils equal, round, reactive to light and accomodation Throat: normal findings: oropharynx pink & moist without lesions or evidence of thrush Neck: no adenopathy and supple, symmetrical,  trachea midline Lungs: clear to auscultation bilaterally Heart: regular rate and rhythm Abdomen: normal findings: bowel sounds normal and soft, non-tender Extremities: edema none LE, and R shouder dressed. clean   Results for orders placed during the hospital encounter of 03/21/14 (from the past 48 hour(s))  BASIC METABOLIC PANEL     Status: Abnormal   Collection Time    03/21/14  5:54 AM      Result Value Ref Range   Sodium 128 (*) 137 - 147 mEq/L   Potassium 4.8  3.7 - 5.3 mEq/L   Chloride 91 (*) 96 - 112 mEq/L   CO2 24  19 - 32 mEq/L   Glucose, Bld 92  70 - 99 mg/dL   BUN 12  6 - 23 mg/dL   Creatinine, Ser 0.91  0.50 - 1.10 mg/dL   Calcium 9.5  8.4 - 10.5  mg/dL   GFR calc non Af Amer 59 (*) >90 mL/min   GFR calc Af Amer 68 (*) >90 mL/min   Comment: (NOTE)     The eGFR has been calculated using the CKD EPI equation.     This calculation has not been validated in all clinical situations.     eGFR's persistently <90 mL/min signify possible Chronic Kidney     Disease.   Anion gap 13  5 - 15  GRAM STAIN     Status: None   Collection Time    03/21/14  8:40 AM      Result Value Ref Range   Specimen Description FLUID SHOU RIGHT SYNOVIAL     Special Requests RIGHT SHOULDER BURSA NO 1     Gram Stain       Value: FEW WBC PRESENT,BOTH PMN AND MONONUCLEAR     NO ORGANISMS SEEN     CALLED TO DR Marlou Sa 03/21/14 0927 COSTELLO B   Report Status 03/21/2014 FINAL    SYNOVIAL FLUID, CRYSTAL     Status: None   Collection Time    03/21/14  8:40 AM      Result Value Ref Range   Crystals, Fluid NO CRYSTALS SEEN    SYNOVIAL FLUID, CRYSTAL     Status: None   Collection Time    03/21/14  8:40 AM      Result Value Ref Range   Crystals, Fluid NO CRYSTALS SEEN     Comment: RESULT CALLED TO, READ BACK BY AND VERIFIED WITH:     ANGELA(OR)BY CLARKS AT 0955 03/21/14  GRAM STAIN     Status: None   Collection Time    03/21/14  8:40 AM      Result Value Ref Range   Specimen Description TISSUE     Special Requests NONE RIGHT SHOULDER BURSA TISSUE     Gram Stain       Value: MODERATE WBC PRESENT,BOTH PMN AND MONONUCLEAR     NO ORGANISMS SEEN     CALLED TO DR Marlou Sa 03/21/14 1000 COSTELLOO B   Report Status 03/21/2014 FINAL    GRAM STAIN     Status: None   Collection Time    03/21/14  8:50 AM      Result Value Ref Range   Specimen Description FLUID SHOULDER RIGHT SYNOVIAL     Special Requests RIGHT SHOULDER JOINT     Gram Stain       Value: RARE WBC PRESENT,BOTH PMN AND MONONUCLEAR     NO ORGANISMS SEEN     CALLED TO  DR Marlou Sa 03/21/14 0932 COSTELLO B  Report Status 03/21/2014 FINAL        Component Value Date/Time   SDES FLUID SHOULDER RIGHT SYNOVIAL  03/21/2014 0850   SPECREQUEST RIGHT SHOULDER JOINT 03/21/2014 0850   CULT  Value: NO GROWTH Performed at [facility] 03/14/2014 1024   REPTSTATUS 03/21/2014 FINAL 03/21/2014 0850   No results found. Recent Results (from the past 240 hour(s))  URINE CULTURE     Status: None   Collection Time    03/14/14 10:24 AM      Result Value Ref Range Status   Specimen Description URINE, CLEAN CATCH   Final   Special Requests NONE   Final   Culture  Setup Time     Final   Value: 03/14/2014 16:48     Performed at [facility]     Final   Value: NO GROWTH     Performed at [facility]   Culture     Final   Value: NO GROWTH     Performed at [facility]   Report Status 03/15/2014 FINAL   Final  GRAM STAIN     Status: None   Collection Time    03/21/14  8:40 AM      Result Value Ref Range Status   Specimen Description FLUID SHOU RIGHT SYNOVIAL   Final   Special Requests RIGHT SHOULDER BURSA NO 1   Final   Gram Stain     Final   Value: FEW WBC PRESENT,BOTH PMN AND MONONUCLEAR     NO ORGANISMS SEEN     CALLED TO DR Marlou Sa 03/21/14 0927 COSTELLO B   Report Status 03/21/2014 FINAL   Final  GRAM STAIN     Status: None   Collection Time    03/21/14  8:40 AM      Result Value Ref Range Status   Specimen Description TISSUE   Final   Special Requests NONE RIGHT SHOULDER BURSA TISSUE   Final   Gram Stain     Final   Value: MODERATE WBC PRESENT,BOTH PMN AND MONONUCLEAR     NO ORGANISMS SEEN     CALLED TO DR Marlou Sa 03/21/14 1000 COSTELLOO B   Report Status 03/21/2014 FINAL   Final  GRAM STAIN     Status: None   Collection Time    03/21/14  8:50 AM      Result Value Ref Range Status   Specimen Description FLUID SHOULDER RIGHT SYNOVIAL   Final   Special Requests RIGHT SHOULDER JOINT   Final   Gram Stain     Final   Value: RARE WBC PRESENT,BOTH PMN AND MONONUCLEAR     NO ORGANISMS SEEN     CALLED TO  DR Marlou Sa 03/21/14 0932 COSTELLO B   Report Status  03/21/2014 FINAL   Final      03/21/2014, 3:21 PM     LOS: 0 days

## 2014-03-21 NOTE — Anesthesia Preprocedure Evaluation (Addendum)
Anesthesia Evaluation  Patient identified by MRN, date of birth, ID band Patient awake    Reviewed: Allergy & Precautions, H&P , NPO status , Patient's Chart, lab work & pertinent test results  Airway Mallampati: I TM Distance: >3 FB Neck ROM: Full    Dental  (+) Teeth Intact, Dental Advisory Given   Pulmonary former smoker,  breath sounds clear to auscultation        Cardiovascular hypertension, Pt. on medications Rhythm:Regular Rate:Normal     Neuro/Psych    GI/Hepatic negative GI ROS, Neg liver ROS,   Endo/Other    Renal/GU      Musculoskeletal   Abdominal   Peds  Hematology   Anesthesia Other Findings   Reproductive/Obstetrics negative OB ROS                          Anesthesia Physical Anesthesia Plan  ASA: II  Anesthesia Plan: General   Post-op Pain Management:    Induction: Intravenous  Airway Management Planned: Oral ETT  Additional Equipment:   Intra-op Plan:   Post-operative Plan: Extubation in OR  Informed Consent: I have reviewed the patients History and Physical, chart, labs and discussed the procedure including the risks, benefits and alternatives for the proposed anesthesia with the patient or authorized representative who has indicated his/her understanding and acceptance.   Dental advisory given  Plan Discussed with: CRNA and Surgeon  Anesthesia Plan Comments:         Anesthesia Quick Evaluation

## 2014-03-21 NOTE — H&P (Signed)
Nicole Preston is an 80 y.o. female.   Chief Complaint: Right shoulder pain HPI: K. is a 80 year old female with right shoulder fracture. She sustained a fracture about 2 months ago. She wants to pursue nonoperative management even though the articular surface had significant inferior displacement. Patient's failed nonoperative management and presents now for operative management. CT scan does show displaced anatomic neck humerus fracture. Also showed some early degenerative changes in the glenoid; however the patient had room really no shoulder symptoms prior to this injury. No family history of DVT or pulmonary embolism  Past Medical History  Diagnosis Date  . Osteopetrosis   . S/p nephrectomy   . Hemorrhoids   . Hypertension   . COPD (chronic obstructive pulmonary disease)   . Renal cancer 4/12    rt nephrectomy  . Arthritis   . Osteoporosis     Past Surgical History  Procedure Laterality Date  . Abdominal surgery  2012    for renal carcinoma tumor  . Abdominal hysterectomy    . Colonoscopy  12/29/2011    Procedure: COLONOSCOPY;  Surgeon: Inda Castle, MD;  Location: [facility];  Service: Endoscopy;  Laterality: N/A;    Family History  Problem Relation Age of Onset  . Colon cancer Neg Hx   . Kidney cancer Mother    Social History:  reports that she quit smoking about 30 years ago. Her smoking use included Cigarettes. She smoked 1.00 pack per day. She has never used smokeless tobacco. She reports that she drinks about 8.4 ounces of alcohol per week. She reports that she does not use illicit drugs.  Allergies: No Known Allergies  Medications Prior to Admission  Medication Sig Dispense Refill  . benazepril (LOTENSIN) 10 MG tablet Take 10 mg by mouth daily.      . cholecalciferol (VITAMIN D) 1000 UNITS tablet Take 1,000 Units by mouth 2 (two) times daily.      . diphenhydrAMINE (BENADRYL) 25 MG tablet Take 12.5 mg by mouth at bedtime as needed for sleep.      Marland Kitchen  HYDROcodone-acetaminophen (NORCO/VICODIN) 5-325 MG per tablet Take 1-2 tablets by mouth every 4 (four) hours as needed for moderate pain.        No results found for this or any previous visit (from the past 48 hour(s)). No results found.  Review of Systems  Constitutional: Negative.   HENT: Negative.   Eyes: Negative.   Respiratory: Negative.   Cardiovascular: Negative.   Gastrointestinal: Negative.   Genitourinary: Negative.   Musculoskeletal: Positive for joint pain.  Skin: Negative.   Neurological: Negative.   Psychiatric/Behavioral: Negative.     Blood pressure 161/53, pulse 68, temperature 98 F (36.7 C), temperature source Oral, resp. rate 20, SpO2 100.00%. Physical Exam  Constitutional: She appears well-developed.  HENT:  Head: Normocephalic.  Eyes: Pupils are equal, round, and reactive to light.  Neck: Normal range of motion.  Cardiovascular: Normal rate.   Respiratory: Effort normal.  Neurological: She is alert.  Skin: Skin is warm.  Psychiatric: She has a normal mood and affect.   examination the right shoulder demonstrates restricted range of motion which is painful. Extra rotation 15 abduction is only about 20. She has functional deltoid firing. Radial pulses intact. Cervical spine range of motion is intact. Rotator cuff as best can be determined it appears intact.  Assessment/Plan Impression is displaced anatomic neck fracture right humerus plan right shoulder hemiarthroplasty versus total shoulder replacement. The decision will depend about total shoulder replacement  on status of the glenoid. Patient was not having any symptoms prior to her injury. Only more toward to perform a hemiarthroplasty and left the glenoid showed very significant degenerative changes. Risk and benefits of the procedure discussed with the patient due to limited to infection nerve vessel damage potential for incomplete pain relief as well as restricted range of motion all questions  answered  Nicole Preston 03/21/2014, 7:12 AM

## 2014-03-21 NOTE — Transfer of Care (Signed)
Immediate Anesthesia Transfer of Care Note  Patient: [name]  Procedure(s) Performed: Procedure(s) with comments: IRRIGATION AND DEBRIDEMENT SHOULDER, BICEPS TENODESIS (Right) - I&D RIGHT SHOULDER WITH BICEPS TENODESIS BICEPS TENODESIS (Right)  Patient Location: PACU  Anesthesia Type:General  Level of Consciousness: awake, alert , oriented and patient cooperative  Airway & Oxygen Therapy: Patient Spontanous Breathing and Patient connected to nasal cannula oxygen  Post-op Assessment: Report given to PACU RN and Post -op Vital signs reviewed and stable  Post vital signs: Reviewed  Complications: No apparent anesthesia complications

## 2014-03-21 NOTE — Anesthesia Postprocedure Evaluation (Signed)
  Anesthesia Post-op Note  Patient: Nicole Preston  Procedure(s) Performed: Procedure(s) with comments: IRRIGATION AND DEBRIDEMENT SHOULDER, BICEPS TENODESIS (Right) - I&D RIGHT SHOULDER WITH BICEPS TENODESIS BICEPS TENODESIS (Right)  Patient Location: PACU  Anesthesia Type:GA combined with regional for post-op pain  Level of Consciousness: awake and alert   Airway and Oxygen Therapy: Patient Spontanous Breathing  Post-op Pain: none  Post-op Assessment: Post-op Vital signs reviewed and Patient's Cardiovascular Status Stable  Post-op Vital Signs: stable  Last Vitals:  Filed Vitals:   03/21/14 1108  BP:   Pulse:   Temp: 36.4 C  Resp:     Complications: No apparent anesthesia complications

## 2014-03-21 NOTE — Anesthesia Procedure Notes (Addendum)
Anesthesia Regional Block:  Interscalene brachial plexus block  Pre-Anesthetic Checklist: ,, timeout performed, Correct Patient, Correct Site, Correct Laterality, Correct Procedure, Correct Position, site marked, Risks and benefits discussed, at surgeon's request and post-op pain management  Laterality: Upper and Right  Prep: chloraprep and alcohol swabs       Needles:  Injection technique: Single-shot  Needle Type: Stimulator Needle - 40     Needle Length: 9cm 9 cm Needle Gauge: 22 and 22 G  Needle insertion depth: 3 cm   Additional Needles:  Procedures: ultrasound guided (picture in chart) and nerve stimulator Interscalene brachial plexus block  Nerve Stimulator or Paresthesia:  Response: Twitch elicited, 0.5 mA, 0.3 ms,   Additional Responses:   Narrative:  Start time: 03/21/2014 7:00 AM End time: 03/21/2014 7:16 AM Injection made incrementally with aspirations every 5 mL.  Performed by: Personally  Anesthesiologist: Bartolo Darter, MD  Additional Notes: Block assessed prior to start of surgery   Procedure Name: Intubation Date/Time: 03/21/2014 7:40 AM Performed by: Jenne Campus Pre-anesthesia Checklist: Patient identified, Emergency Drugs available, Suction available, Patient being monitored and Timeout performed Patient Re-evaluated:Patient Re-evaluated prior to inductionOxygen Delivery Method: Circle system utilized Preoxygenation: Pre-oxygenation with 100% oxygen Intubation Type: IV induction Ventilation: Mask ventilation without difficulty Laryngoscope Size: Miller and 2 Grade View: Grade I Tube type: Oral Tube size: 7.0 mm Number of attempts: 1 Airway Equipment and Method: Stylet Placement Confirmation: ETT inserted through vocal cords under direct vision,  positive ETCO2,  CO2 detector and breath sounds checked- equal and bilateral Secured at: 21 cm Tube secured with: Tape Dental Injury: Teeth and Oropharynx as per pre-operative assessment

## 2014-03-21 NOTE — Brief Op Note (Signed)
03/21/2014  10:12 AM  PATIENT:  [name]  80 y.o. female  PRE-OPERATIVE DIAGNOSIS:  RIGHT SHOULDER FRACTURE  POST-OPERATIVE DIAGNOSIS:  RIGHT SHOULDER FRACTURE  PROCEDURE:  Procedure(s): IRRIGATION AND DEBRIDEMENT SHOULDER, BICEPS TENODESIS BICEPS TENODESIS placement of antibiotic beads  SURGEON:  Surgeon(s): Meredith Pel, MD  ASSISTANT: s vernon pa  ANESTHESIA:   general  EBL: 25 ml    Total I/O In: 1500 [I.V.:1500] Out: 400 [Urine:250; Blood:150]  BLOOD ADMINISTERED: none  DRAINS: none   LOCAL MEDICATIONS USED:  none  SPECIMEN:  1. Infected anterior bursa fluid for gs and culture and crystals 2.infected bursa for gs and culture and crystals 3.joint fluid for gs culture and crystals  COUNTS:  YES  TOURNIQUET:  * No tourniquets in log *  DICTATION: .Other Dictation: Dictation Number [id number]  PLAN OF CARE: Admit for overnight observation  PATIENT DISPOSITION:  PACU - hemodynamically stable

## 2014-03-21 NOTE — Addendum Note (Signed)
Addendum created 03/21/14 1520 by Jenne Campus, CRNA   Modules edited: Clinical Notes   Clinical Notes:  File: [id number]

## 2014-03-22 ENCOUNTER — Encounter (HOSPITAL_COMMUNITY): Payer: Self-pay | Admitting: General Practice

## 2014-03-22 DIAGNOSIS — L089 Local infection of the skin and subcutaneous tissue, unspecified: Secondary | ICD-10-CM

## 2014-03-22 DIAGNOSIS — S40929A Unspecified superficial injury of unspecified upper arm, initial encounter: Secondary | ICD-10-CM

## 2014-03-22 DIAGNOSIS — S42293A Other displaced fracture of upper end of unspecified humerus, initial encounter for closed fracture: Secondary | ICD-10-CM | POA: Diagnosis not present

## 2014-03-22 DIAGNOSIS — S40919A Unspecified superficial injury of unspecified shoulder, initial encounter: Secondary | ICD-10-CM

## 2014-03-22 MED ORDER — VANCOMYCIN HCL 500 MG IV SOLR: 500.0000 mg | Freq: Two times a day (BID) | INTRAVENOUS | Status: DC

## 2014-03-22 MED ORDER — HYDROCODONE-ACETAMINOPHEN 5-325 MG PO TABS: 1.0000 | ORAL_TABLET | ORAL | Status: DC | PRN

## 2014-03-22 MED ORDER — SODIUM CHLORIDE 0.9 % IJ SOLN: 10.0000 mL | INTRAMUSCULAR | Status: DC | PRN

## 2014-03-22 MED ORDER — DEXTROSE 5 % IV SOLN: 2.0000 g | INTRAVENOUS | Status: DC

## 2014-03-22 MED ORDER — ASPIRIN 325 MG PO TABS: 325.0000 mg | ORAL_TABLET | Freq: Every day | ORAL | Status: DC

## 2014-03-22 MED ORDER — HEPARIN SOD (PORK) LOCK FLUSH 100 UNIT/ML IV SOLN
250.0000 [IU] | INTRAVENOUS | Status: AC | PRN
  Administered 2014-03-22: 250 [IU]

## 2014-03-22 NOTE — Evaluation (Addendum)
Occupational Therapy Evaluation Patient Details Name: Nicole Preston MRN: [id number] DOB: Dec 26, 1933 Today's Date: 03/22/2014    History of Present Illness 80 y.o. s/p I&D RIGHT SHOULDER WITH BICEPS TENODESIS   Clinical Impression   Pt s/p above. Pt moving well and education provided. Feel pt is safe to d/c home from OT standpoint.     Follow Up Recommendations  No OT follow up    Equipment Recommendations  None recommended by OT    Recommendations for Other Services       Precautions / Restrictions Precautions Required Braces or Orthoses: Sling (comfort) Restrictions Weight Bearing Restrictions: No    Mobility Bed Mobility Overal bed mobility: Modified Independent                Transfers Overall transfer level: Needs assistance   Transfers: Sit to/from Stand Sit to Stand: Supervision         General transfer comment: due to dizziness    Balance                                            ADL Overall ADL's : Needs assistance/impaired         Upper Body Bathing: Set up;Sitting   Lower Body Bathing: Sit to/from stand;Supervison/ safety   Upper Body Dressing : Minimal assistance;Sitting   Lower Body Dressing: Set up;Supervision/safety;Sit to/from stand   Toilet Transfer: Supervision/safety;Ambulation           Functional mobility during ADLs: Supervision/safety (due to dizziness) General ADL Comments: Reviewed techniques for ADLs (dressing technique, use of button up shirts/shirts with big necks). Told pt no pushing, pulling, lifting with RUE. Discussed positioning of pillows. Educated on exercises for RUE and pt performed.  Recommended sitting for LB bathing and discussed what to use for shower chair. Reviewed how to wash under left arm. Discussed making the bed (OT advised to avoid this right now). OT explained she can use arm functionally (ex. Wash hair)     Vision                     Perception     Praxis       Pertinent Vitals/Pain Pain Assessment: 0-10 Pain Score: 6  Pain Location: RUE Pain Intervention(s): Monitored during session     Hand Dominance Right   Extremity/Trunk Assessment Upper Extremity Assessment Upper Extremity Assessment: RUE deficits/detail RUE Deficits / Details: I & D right shoulder with biceps tenodesis   Lower Extremity Assessment Lower Extremity Assessment: Overall WFL for tasks assessed       Communication Communication Communication: No difficulties   Cognition Arousal/Alertness: Awake/alert Behavior During Therapy: WFL for tasks assessed/performed Overall Cognitive Status: Within Functional Limits for tasks assessed                     General Comments       Exercises Exercises: Other exercises Other Exercises Other Exercises: Performed a few pendulums (OT assisted in technique). Performed 15 reps AROM elbow flexion/extension, performed digit composite flexion/extension and wrist flexion/extension. Other Exercises: lap slides   Shoulder Instructions Shoulder Instructions Donning/doffing shirt without moving shoulder:  (educated/reviewed-pt allowed to move shoulder) Method for sponge bathing under operated UE:  (educated-pt allowed to move shoulder) Donning/doffing sling/immobilizer: Modified independent Correct positioning of sling/immobilizer: Supervision/safety ROM for elbow, wrist and digits of operated UE:  (educated and pt  performed) Sling wearing schedule (on at all times/off for ADL's):  (for comfort) Positioning of UE while sleeping:  (educated)    [place] expects to be discharged to:: Private residence Living Arrangements: Spouse/significant other Available Help at Discharge: Family;Available 24 hours/day Type of Home: House Home Access: Stairs to enter [employer] of Steps: 5 Entrance Stairs-Rails: Right;Left Home Layout: Two level     Bathroom Shower/Tub: Occupational psychologist:  Standard                Prior Functioning/Environment Level of Independence: Independent             OT Diagnosis:     OT Problem List:     OT Treatment/Interventions:      OT Goals(Current goals can be found in the care plan section)    OT Frequency:     Barriers to D/C:            Co-evaluation              End of Session Equipment Utilized During Treatment: Other (comment) (sling) Nurse Communication: Mobility status  Activity Tolerance: Patient tolerated treatment well Patient left: in bed;with call bell/phone within reach   Time: 0911-0938 OT Time Calculation (min): 27 min Charges:  OT General Charges $OT Visit: 1 Procedure OT Evaluation $Initial OT Evaluation Tier I: 1 Procedure OT Treatments $Self Care/Home Management : 8-22 mins G-Codes: OT G-codes **NOT FOR INPATIENT CLASS** Functional Assessment Tool Used: clinical judgment Functional Limitation: Self care Self Care Current Status (H7342): At least 1 percent but less than 20 percent impaired, limited or restricted Self Care Goal Status (A7681): At least 1 percent but less than 20 percent impaired, limited or restricted Self Care Discharge Status [phone]): At least 1 percent but less than 20 percent impaired, limited or restricted  Benito Mccreedy OTR/L [phone] 03/22/2014, 11:32 AM

## 2014-03-22 NOTE — Progress Notes (Signed)
aquacell

## 2014-03-22 NOTE — Progress Notes (Signed)
Patient provided with discharge instructions and follow up information. She is going home with IV antibiotics and is set up with [place]. Husband and patient have been educated at bedside.

## 2014-03-22 NOTE — Progress Notes (Signed)
UR completed 

## 2014-03-22 NOTE — Progress Notes (Signed)
Warm blanket provided.

## 2014-03-22 NOTE — Progress Notes (Signed)
Pt stable Incision ok Plan to defer prosthesis until abx completed Dc after pic

## 2014-03-22 NOTE — Progress Notes (Signed)
INFECTIOUS DISEASE PROGRESS NOTE  ID: Nicole Preston is a 80 y.o. female with  Active Problems:   Superficial injury of shoulder with infection  Subjective: No complaints. Worried that this could be cancer spread.   Abtx:  Anti-infectives   Start     Dose/Rate Route Frequency Ordered Stop   03/22/14 0000  cefTRIAXone 2 g in dextrose 5 % 50 mL     2 g 100 mL/hr over 30 Minutes Intravenous Every 24 hours 03/22/14 0828     03/22/14 0000  vancomycin 500 mg in sodium chloride 0.9 % 100 mL     500 mg 100 mL/hr over 60 Minutes Intravenous Every 12 hours 03/22/14 0828     03/21/14 1700  cefTRIAXone (ROCEPHIN) 2 g in dextrose 5 % 50 mL IVPB     2 g 100 mL/hr over 30 Minutes Intravenous Every 24 hours 03/21/14 1546     03/21/14 1700  vancomycin (VANCOCIN) 500 mg in sodium chloride 0.9 % 100 mL IVPB     500 mg 100 mL/hr over 60 Minutes Intravenous Every 12 hours 03/21/14 1635     03/21/14 1330  ceFAZolin (ANCEF) IVPB 1 g/50 mL premix  Status:  Discontinued     1 g 100 mL/hr over 30 Minutes Intravenous Every 6 hours 03/21/14 1153 03/21/14 1639   03/21/14 0955  gentamicin (GARAMYCIN) injection  Status:  Discontinued       As needed 03/21/14 0957 03/21/14 1031   03/21/14 0948  vancomycin (VANCOCIN) powder  Status:  Discontinued       As needed 03/21/14 0958 03/21/14 1031   03/21/14 0600  ceFAZolin (ANCEF) IVPB 2 g/50 mL premix     2 g 100 mL/hr over 30 Minutes Intravenous On call to O.R. 03/20/14 1403 03/21/14 0741      Medications:  Scheduled: . aspirin  325 mg Oral Daily  . benazepril  10 mg Oral Daily  . cefTRIAXone (ROCEPHIN)  IV  2 g Intravenous Q24H  . cholecalciferol  1,000 Units Oral BID  . vancomycin  500 mg Intravenous Q12H    Objective: Vital signs in last 24 hours: Temp:  [97.5 F (36.4 C)-98.8 F (37.1 C)] 98.1 F (36.7 C) (09/30 0558) Pulse Rate:  [58-78] 64 (09/30 0558) Resp:  [14] 14 (09/30 0558) BP: (120-140)/(46-64) 120/46 mmHg (09/30 0558) SpO2:  [96  %-98 %] 97 % (09/30 0558)   General appearance: alert, cooperative and no distress Extremities: r shoulder dressed. minimal edema  Lab Results  Recent Labs  03/21/14 0554  NA 128*  K 4.8  CL 91*  CO2 24  BUN 12  CREATININE 0.91   Liver Panel No results found for this basename: PROT, ALBUMIN, AST, ALT, ALKPHOS, BILITOT, BILIDIR, IBILI,  in the last 72 hours Sedimentation Rate No results found for this basename: ESRSEDRATE,  in the last 72 hours C-Reactive Protein No results found for this basename: CRP,  in the last 72 hours  Microbiology: Recent Results (from the past 240 hour(s))  URINE CULTURE     Status: None   Collection Time    03/14/14 10:24 AM      Result Value Ref Range Status   Specimen Description URINE, CLEAN CATCH   Final   Special Requests NONE   Final   Culture  Setup Time     Final   Value: 03/14/2014 16:48     Performed at [facility]     Final  Value: NO GROWTH     Performed at [facility]   Culture     Final   Value: NO GROWTH     Performed at [facility]   Report Status 03/15/2014 FINAL   Final  GRAM STAIN     Status: None   Collection Time    03/21/14  8:40 AM      Result Value Ref Range Status   Specimen Description FLUID SHOU RIGHT SYNOVIAL   Final   Special Requests RIGHT SHOULDER BURSA NO 1   Final   Gram Stain     Final   Value: FEW WBC PRESENT,BOTH PMN AND MONONUCLEAR     NO ORGANISMS SEEN     CALLED TO DR Marlou Sa 03/21/14 0927 [place] B   Report Status 03/21/2014 FINAL   Final  ANAEROBIC CULTURE     Status: None   Collection Time    03/21/14  8:40 AM      Result Value Ref Range Status   Specimen Description FLUID SHOULDER RIGHT SYNOVIAL   Final   Special Requests RIGHT SHOULDER BURSA TISSUE NO1 NO 1   Final   Gram Stain     Final   Value: FEW WBC PRESENT,BOTH PMN AND MONONUCLEAR     NO SQUAMOUS EPITHELIAL CELLS SEEN     NO ORGANISMS SEEN     Performed at [facility]      Performed at [facility]   Culture     Final   Value: NO ANAEROBES ISOLATED; CULTURE IN PROGRESS FOR 5 DAYS     Note: Gram Stain Report Called to,Read Back By and Verified With: DR Marlou Sa 03/21/14 0927 [place] B     Performed at [facility]   Report Status PENDING   Incomplete  TISSUE CULTURE     Status: None   Collection Time    03/21/14  8:40 AM      Result Value Ref Range Status   Specimen Description TISSUE SHOULDER RIGHT   Final   Special Requests RIGHT SHOULDER BURSA TISSUE NO 2   Final   Gram Stain     Final   Value: MODERATE WBC PRESENT,BOTH PMN AND MONONUCLEAR     NO SQUAMOUS EPITHELIAL CELLS SEEN     NO ORGANISMS SEEN     Performed at [facility]     Performed at [facility]   Culture     Final   Value: NO GROWTH 1 DAY     Note: Gram Stain Report Called to,Read Back By and Verified With: DR Marlou Sa 03/21/14 1000 [place] B     Performed at [facility]   Report Status PENDING   Incomplete  BODY FLUID CULTURE     Status: None   Collection Time    03/21/14  8:40 AM      Result Value Ref Range Status   Specimen Description FLUID SHOULDER RIGHT SYNOVIAL   Final   Special Requests NO 1   Final   Gram Stain     Final   Value: FEW WBC PRESENT,BOTH PMN AND MONONUCLEAR     NO ORGANISMS SEEN     Performed at [facility]     Performed at [facility]   Culture     Final   Value: NO GROWTH 1 DAY     Note: Gram Stain Report Called to,Read Back By and Verified With: DR Marlou Sa 03/21/14 [place] B  Performed at [facility]   Report Status PENDING   Incomplete  GRAM STAIN     Status: None   Collection Time    03/21/14  8:40 AM      Result Value Ref Range Status   Specimen Description TISSUE   Final   Special Requests NONE RIGHT SHOULDER BURSA TISSUE   Final   Gram Stain     Final   Value: MODERATE WBC PRESENT,BOTH PMN AND MONONUCLEAR     NO ORGANISMS SEEN     CALLED TO DR Marlou Sa 03/21/14 1000 [place] B    Report Status 03/21/2014 FINAL   Final  GRAM STAIN     Status: None   Collection Time    03/21/14  8:50 AM      Result Value Ref Range Status   Specimen Description FLUID SHOULDER RIGHT SYNOVIAL   Final   Special Requests RIGHT SHOULDER JOINT   Final   Gram Stain     Final   Value: RARE WBC PRESENT,BOTH PMN AND MONONUCLEAR     NO ORGANISMS SEEN     CALLED TO  DR Marlou Sa 03/21/14 0932 [place] B   Report Status 03/21/2014 FINAL   Final  ANAEROBIC CULTURE     Status: None   Collection Time    03/21/14  8:50 AM      Result Value Ref Range Status   Specimen Description FLUID SHOULDER RIGHT SYNOVIAL   Final   Special Requests RIGHT SHOULDER JOINT   Final   Gram Stain     Final   Value: RARE WBC PRESENT,BOTH PMN AND MONONUCLEAR     NO ORGANISMS SEEN     Performed at [facility]     Performed at [facility]   Culture     Final   Value: NO ANAEROBES ISOLATED; CULTURE IN PROGRESS FOR 5 DAYS     Note: Gram Stain Report Called to,Read Back By and Verified With: DR Marlou Sa 03/21/14 0932 [place] B     Performed at [facility]   Report Status PENDING   Incomplete  BODY FLUID CULTURE     Status: None   Collection Time    03/21/14  8:50 AM      Result Value Ref Range Status   Specimen Description FLUID SHOULDER RIGHT SYNOVIAL   Final   Special Requests RIGHT SHOULDER JOINT   Final   Gram Stain     Final   Value: RARE WBC PRESENT,BOTH PMN AND MONONUCLEAR     NO ORGANISMS SEEN     Performed at [facility] Gram Stain Report Called to,Read Back By and Verified With: Gram Stain Report Called to,Read Back By and Verified With: DR Marlou Sa 03/21/14 0932 [place] B     Performed at [facility]   Culture     Final   Value: NO GROWTH 1 DAY     Performed at [facility]   Report Status PENDING   Incomplete    Studies/Results: No results found.   Assessment/Plan: Septic Arthritis  Osteoperosis  Renal Cell Ca (resected [facility])  Day 2  vanco/ceftriaxone Await her Cx Continue her current ceftriaxone/vanco Has PIC         Bobby Rumpf Infectious Diseases (pager) [phone] [email or web address] 03/22/2014, 4:51 PM  LOS: 1 day

## 2014-03-22 NOTE — Op Note (Signed)
NAME:  Nicole Preston, Nicole Preston NO.:  [id number]  MEDICAL RECORD NO.:  [id number]  LOCATION:  [facility]                         FACILITY:  [facility]  PHYSICIAN:  Nicole Preston, M.D.    DATE OF BIRTH:  [birth year]  DATE OF PROCEDURE: DATE OF DISCHARGE:                              OPERATIVE REPORT   PREOPERATIVE DIAGNOSIS:  Right shoulder fracture.  POSTOPERATIVE DIAGNOSIS:  Right shoulder fracture.  PROCEDURE:  Right shoulder I and D with biceps tenodesis, placement of antibiotic beads.  SURGEON:  Nicole Preston, M.D.  ASSISTANT:  Epimenio Foot, P.A.  ANESTHESIA:  General.  INDICATIONS:  Nicole Preston is a patient, 80 years old, who is several months out from a proximal humerus fracture.  After failure of conservative management, the patient presents now for hemiarthroplasty. The patient was not having any preoperative fevers or chills, but she did notice some anterior shoulder swelling.  Preop white count was normal.  No history of gout or pseudogout.  PROCEDURE IN DETAIL:  The patient was brought to the operating room where general endotracheal anesthesia was induced.  Preop antibiotics were administered.  Time-out was called.  The patient was placed in a beach-chair position with the head in neutral position.  Right arm prepped, prescrubbed with alcohol and Betadine, allowed to air dry, prepped with DuraPrep solution and draped in sterile manner.  Time-out was called.  Anterior deltopectoral approach was made to the right shoulder.  Skin and subcu tissues were sharply divided.  Cephalic vein mobilized medially.  Kolbel retractor placed.  Bulbous appearing bursal tissue on top of the biceps tendon was encountered.  Upon removing this bursal tissue, purulent fluid was encountered.  This was placed on aerobic and anaerobic swab cultures, sent for stat Gram stain, culture, and crystal analysis.  The bursa was subsequently removed and the bursal sac itself was also sent for  Gram stain, culture, and crystal analysis. Thorough irrigation was performed.  Biceps tendon was identified. Tenodesis was performed to the pec tendon.  Rotator interval was entered.  The synovial fluid within the shoulder joint itself was clear. However, it was sent also for stat Gram stain, culture, and crystals. Thorough irrigation was performed of both the shoulder joint and the anterior shoulder.  This was done using a 14-gauge Angiocath, delivered about 500 mL through the shoulder joint itself.  This was performed while we are waiting for laboratory analysis.  Laboratory analysis failed to show any organisms, but had also failed to show any crystals. Because of the gross appearance of the fluid, which appears distinctly purulent and a volume of approximately 3-5 mL within that anterior bursa, it was elected to not perform the shoulder hemiarthroplasty at this time.  A thorough irrigation was performed and antibiotic beads with vancomycin and gentamicin were placed both into the shoulder joint and into the anterior shoulder region.  Deltopectoral interval was then closed using a #0 nonabsorbable suture.  Skin was closed using interrupted inverted 2-0 Vicryl and 3-0 pullout Prolene.  Nicole Preston's assistance required at all times during the case.  Plan is for Infectious Disease consult, IV antibiotics, and later placement of hemiarthroplasty after a course  of 3-4 weeks of IV antibiotics.  This was also discussed in the middle of the case with the patient's husband.  Nicole Preston's assistance required at all times for opening, closing, retraction of neurovascular structures.  It should also be noted that the 3 sisters were tied in terms of preparing for a future hemiarthroplasty.     Nicole Preston, M.D.     GSD/MEDQ  D:  03/21/2014  T:  03/21/2014  Job:  [id number]

## 2014-03-22 NOTE — Care Management Note (Signed)
CARE MANAGEMENT NOTE 03/22/2014  Patient:  MEGHIN, THIVIERGE   Account Number:  [id number]  Date Initiated:  03/22/2014  Documentation initiated by:  Ricki Miller  Subjective/Objective Assessment:   80 yr old female admitted with right shoulder  fracture, 03/21/14 I & D and placement of antibiotic beads.Will return for hemiarthroplasty in a few weeks.     Action/Plan:   Case manager spoke with patient concerning home health RN for IV antibiotics. Preoperatively setup with [facility], no changes. PICC has  been inserted.   Anticipated DC Date:  03/22/2014   Anticipated DC Plan:  [place]  CM consult      [facility] Choice  HOME HEALTH   Choice offered to / List presented to:  C-1 Patient   DME arranged  NA        [place] arranged  HH-1 RN      [place]   Status of service:  Completed, signed off Medicare Important Message given?   (If response is "NO", the following Medicare IM given date fields will be blank) Date Medicare IM given:   Medicare IM given by:   Date Additional Medicare IM given:   Additional Medicare IM given by:    Discharge Disposition:  [place]  Per UR Regulation:  Reviewed for med. necessity/level of care/duration of stay  If discussed at [place] of Stay Meetings, dates discussed:    Comments:

## 2014-03-22 NOTE — Progress Notes (Signed)
Peripherally Inserted Central Catheter/Midline Placement  The IV Nurse has discussed with the patient and/or persons authorized to consent for the patient, the purpose of this procedure and the potential benefits and risks involved with this procedure.  The benefits include less needle sticks, lab draws from the catheter and patient may be discharged home with the catheter.  Risks include, but not limited to, infection, bleeding, blood clot (thrombus formation), and puncture of an artery; nerve damage and irregular heat beat.  Alternatives to this procedure were also discussed.  PICC/Midline Placement Documentation        Nicole Preston 03/22/2014, 10:31 AM

## 2014-03-24 LAB — TISSUE CULTURE: Culture: NO GROWTH

## 2014-03-24 LAB — BODY FLUID CULTURE
CULTURE: NO GROWTH
Culture: NO GROWTH

## 2014-03-26 LAB — ANAEROBIC CULTURE

## 2014-03-27 NOTE — Discharge Summary (Signed)
Physician Discharge Summary  Patient ID: Nicole Preston MRN: [id number] DOB/AGE: 08-24-1933 81 y.o.  Admit date: 03/21/2014 Discharge date: 03/22/2014 Admission Diagnoses:  Active Problems:   Superficial injury of shoulder with infection shoulder fracture - anatomic neck  Discharge Diagnoses:  Same  Surgeries: Procedure(s): IRRIGATION AND DEBRIDEMENT SHOULDER, BICEPS TENODESIS BICEPS TENODESIS on 03/21/2014   Consultants:    Discharged Condition: Stable  Hospital Course: Nicole Preston is an 80 y.o. female who was admitted 03/21/2014 with a chief complaint of post traumatic shoulder arthritis, and found to have a diagnosis of well encapsulated infection in biceps sheath area.  They were brought to the operating room on 03/21/2014 and underwent the above named procedures.The plan to perform hemiarthroplasty of the shoulder was changed once infection was encountered. Washout and abx bead placement was performed - id consult obtained. Patient dced home in good condition.    Antibiotics given:  Anti-infectives   Start     Dose/Rate Route Frequency Ordered Stop   03/22/14 0000  cefTRIAXone 2 g in dextrose 5 % 50 mL     2 g 100 mL/hr over 30 Minutes Intravenous Every 24 hours 03/22/14 0828     03/22/14 0000  vancomycin 500 mg in sodium chloride 0.9 % 100 mL     500 mg 100 mL/hr over 60 Minutes Intravenous Every 12 hours 03/22/14 0828     03/21/14 1700  cefTRIAXone (ROCEPHIN) 2 g in dextrose 5 % 50 mL IVPB  Status:  Discontinued     2 g 100 mL/hr over 30 Minutes Intravenous Every 24 hours 03/21/14 1546 03/22/14 2057   03/21/14 1700  vancomycin (VANCOCIN) 500 mg in sodium chloride 0.9 % 100 mL IVPB  Status:  Discontinued     500 mg 100 mL/hr over 60 Minutes Intravenous Every 12 hours 03/21/14 1635 03/22/14 2057   03/21/14 1330  ceFAZolin (ANCEF) IVPB 1 g/50 mL premix  Status:  Discontinued     1 g 100 mL/hr over 30 Minutes Intravenous Every 6 hours 03/21/14 1153 03/21/14 1639   03/21/14  0955  gentamicin (GARAMYCIN) injection  Status:  Discontinued       As needed 03/21/14 0957 03/21/14 1031   03/21/14 0948  vancomycin (VANCOCIN) powder  Status:  Discontinued       As needed 03/21/14 0958 03/21/14 1031   03/21/14 0600  ceFAZolin (ANCEF) IVPB 2 g/50 mL premix     2 g 100 mL/hr over 30 Minutes Intravenous On call to O.R. 03/20/14 1403 03/21/14 0741    .  Recent vital signs:  Filed Vitals:   03/22/14 0558  BP: 120/46  Pulse: 64  Temp: 98.1 F (36.7 C)  Resp: 14    Recent laboratory studies:  Results for orders placed during the hospital encounter of 03/21/14  GRAM STAIN      Result Value Ref Range   Specimen Description FLUID SHOU RIGHT SYNOVIAL     Special Requests RIGHT SHOULDER BURSA NO 1     Gram Stain       Value: FEW WBC PRESENT,BOTH PMN AND MONONUCLEAR     NO ORGANISMS SEEN     CALLED TO DR Marlou Sa 03/21/14 0927 COSTELLO B   Report Status 03/21/2014 FINAL    ANAEROBIC CULTURE      Result Value Ref Range   Specimen Description FLUID SHOULDER RIGHT SYNOVIAL     Special Requests RIGHT SHOULDER BURSA TISSUE NO1 NO 1     Gram Stain  Value: FEW WBC PRESENT,BOTH PMN AND MONONUCLEAR     NO SQUAMOUS EPITHELIAL CELLS SEEN     NO ORGANISMS SEEN     Performed at [facility]     Performed at [facility]   Culture       Value: NO ANAEROBES ISOLATED     Note: Gram Stain Report Called to,Read Back By and Verified With: DR Marlou Sa 03/21/14 0927 COSTELLO B     Performed at [facility]   Report Status 03/26/2014 FINAL    TISSUE CULTURE      Result Value Ref Range   Specimen Description TISSUE SHOULDER RIGHT     Special Requests RIGHT SHOULDER BURSA TISSUE NO 2     Gram Stain       Value: MODERATE WBC PRESENT,BOTH PMN AND MONONUCLEAR     NO SQUAMOUS EPITHELIAL CELLS SEEN     NO ORGANISMS SEEN     Performed at [facility]     Performed at [facility]   Culture       Value: NO GROWTH 3 DAYS     Note: Gram Stain Report  Called to,Read Back By and Verified With: DR Marlou Sa 03/21/14 1000 COSTELLOO B     Performed at [facility]   Report Status 03/24/2014 FINAL    BODY FLUID CULTURE      Result Value Ref Range   Specimen Description FLUID SHOULDER RIGHT SYNOVIAL     Special Requests NO 1     Gram Stain       Value: FEW WBC PRESENT,BOTH PMN AND MONONUCLEAR     NO ORGANISMS SEEN     Performed at [facility]     Performed at [facility]   Culture       Value: NO GROWTH 3 DAYS     Note: Gram Stain Report Called to,Read Back By and Verified With: DR Marlou Sa 03/21/14 0927 COSTELLO B     Performed at [facility]   Report Status 03/24/2014 FINAL    GRAM STAIN      Result Value Ref Range   Specimen Description FLUID SHOULDER RIGHT SYNOVIAL     Special Requests RIGHT SHOULDER JOINT     Gram Stain       Value: RARE WBC PRESENT,BOTH PMN AND MONONUCLEAR     NO ORGANISMS SEEN     CALLED TO  DR Marlou Sa 03/21/14 0932 COSTELLO B   Report Status 03/21/2014 FINAL    ANAEROBIC CULTURE      Result Value Ref Range   Specimen Description FLUID SHOULDER RIGHT SYNOVIAL     Special Requests RIGHT SHOULDER JOINT     Gram Stain       Value: RARE WBC PRESENT,BOTH PMN AND MONONUCLEAR     NO ORGANISMS SEEN     Performed at [facility]     Performed at [facility]   Culture       Value: NO ANAEROBES ISOLATED; CULTURE IN PROGRESS FOR 5 DAYS     Note: Gram Stain Report Called to,Read Back By and Verified With: DR Marlou Sa 03/21/14 0932 COSTELLO B     Performed at [facility]   Report Status PENDING    BODY FLUID CULTURE      Result Value Ref Range   Specimen Description FLUID SHOULDER RIGHT SYNOVIAL     Special Requests RIGHT SHOULDER JOINT     Gram Stain  Value: RARE WBC PRESENT,BOTH PMN AND MONONUCLEAR     NO ORGANISMS SEEN     Performed at [facility] Gram Stain Report Called to,Read Back By and Verified With: Gram Stain Report Called to,Read Back By and Verified  With: DR Marlou Sa 03/21/14 0932 COSTELLO B     Performed at [facility]   Culture       Value: NO GROWTH 3 DAYS     Performed at [facility]   Report Status 03/24/2014 FINAL    GRAM STAIN      Result Value Ref Range   Specimen Description TISSUE     Special Requests NONE RIGHT SHOULDER BURSA TISSUE     Gram Stain       Value: MODERATE WBC PRESENT,BOTH PMN AND MONONUCLEAR     NO ORGANISMS SEEN     CALLED TO DR Marlou Sa 03/21/14 1000 COSTELLOO B   Report Status 03/21/2014 FINAL    BASIC METABOLIC PANEL      Result Value Ref Range   Sodium 128 (*) 137 - 147 mEq/L   Potassium 4.8  3.7 - 5.3 mEq/L   Chloride 91 (*) 96 - 112 mEq/L   CO2 24  19 - 32 mEq/L   Glucose, Bld 92  70 - 99 mg/dL   BUN 12  6 - 23 mg/dL   Creatinine, Ser 0.91  0.50 - 1.10 mg/dL   Calcium 9.5  8.4 - 10.5 mg/dL   GFR calc non Af Amer 59 (*) >90 mL/min   GFR calc Af Amer 68 (*) >90 mL/min   Anion gap 13  5 - 15  SYNOVIAL FLUID, CRYSTAL      Result Value Ref Range   Crystals, Fluid NO CRYSTALS SEEN    SYNOVIAL FLUID, CRYSTAL      Result Value Ref Range   Crystals, Fluid NO CRYSTALS SEEN      Discharge Medications:     Medication List         aspirin 325 MG tablet  Take 1 tablet (325 mg total) by mouth daily.     benazepril 10 MG tablet  Commonly known as:  LOTENSIN  Take 10 mg by mouth daily.     cefTRIAXone 2 g in dextrose 5 % 50 mL  Inject 2 g into the vein daily.     cholecalciferol 1000 UNITS tablet  Commonly known as:  VITAMIN D  Take 1,000 Units by mouth 2 (two) times daily.     diphenhydrAMINE 25 MG tablet  Commonly known as:  BENADRYL  Take 12.5 mg by mouth at bedtime as needed for sleep.     HYDROcodone-acetaminophen 5-325 MG per tablet  Commonly known as:  NORCO/VICODIN  Take 1-2 tablets by mouth every 4 (four) hours as needed for moderate pain.     vancomycin 500 mg in sodium chloride 0.9 % 100 mL  Inject 500 mg into the vein every 12 (twelve) hours.         Diagnostic Studies: Dg Chest 2 View  03/14/2014   CLINICAL DATA:  Preoperative respiratory evaluation prior to right shoulder arthroplasty. Prior history of renal cell carcinoma post nephrectomy. Prior sternotomy.  EXAM: CHEST  2 VIEW  COMPARISON:  None.  FINDINGS: Prior sternotomy. Cardiac silhouette normal in size. Thoracic aorta mildly tortuous and atherosclerotic. Hilar and mediastinal contours otherwise unremarkable. Calcifications in the left upper lobe, likely granulomata. Emphysematous changes in the upper lobes with mild hyperinflation. Lungs otherwise clear. No localized airspace consolidation.  No pleural effusions. No pneumothorax. Normal pulmonary vascularity. Mild degenerative changes involving the thoracic spine.  IMPRESSION: COPD/emphysema. Calcified granulomata in the left upper lobe. No acute cardiopulmonary disease.   Electronically Signed   By: Evangeline Dakin M.D.   On: 03/14/2014 11:47    Disposition: 01-Home or Self Care      Discharge Instructions   Call MD / Call 911    Complete by:  As directed   If you experience chest pain or shortness of breath, CALL 911 and be transported to the hospital emergency room.  If you develope a fever above 101 F, pus (white drainage) or increased drainage or redness at the wound, or calf pain, call your surgeon's office.     Constipation Prevention    Complete by:  As directed   Drink plenty of fluids.  Prune juice may be helpful.  You may use a stool softener, such as Colace (over the counter) 100 mg twice a day.  Use MiraLax (over the counter) for constipation as needed.     Diet - low sodium heart healthy    Complete by:  As directed      Discharge instructions    Complete by:  As directed   [place] for rom as tolerated on shoulder Keep incision dry     Increase activity slowly as tolerated    Complete by:  As directed               Signed: Delwin Raczkowski SCOTT 03/27/2014, 10:22 AM

## 2014-04-04 LAB — ANAEROBIC CULTURE

## 2014-04-05 ENCOUNTER — Ambulatory Visit (INDEPENDENT_AMBULATORY_CARE_PROVIDER_SITE_OTHER): Payer: Medicare Other | Admitting: Infectious Diseases

## 2014-04-05 ENCOUNTER — Encounter: Payer: Self-pay | Admitting: Infectious Diseases

## 2014-04-05 VITALS — BP 192/96 | HR 75 | Temp 98.1°F | Ht 64.0 in | Wt 137.0 lb

## 2014-04-05 DIAGNOSIS — L089 Local infection of the skin and subcutaneous tissue, unspecified: Secondary | ICD-10-CM

## 2014-04-05 DIAGNOSIS — I10 Essential (primary) hypertension: Secondary | ICD-10-CM | POA: Insufficient documentation

## 2014-04-05 DIAGNOSIS — S40911D Unspecified superficial injury of right shoulder, subsequent encounter: Secondary | ICD-10-CM

## 2014-04-05 NOTE — Progress Notes (Signed)
   Subjective:    Patient ID: Nicole Preston, female    DOB: 11/15/1933, 80 y.o.   MRN: [id number]  HPI 80 yo F with hx of renal cell CA (resected at [facility]), osteoporosis, R shoulder fracture after a fall 12-27-13. She failed non-operative management and presented 9-29 for repair of her shoulder. She noted increasing swelling of her R shoulder and "bony" feeling. When taken to the OR 9-29, she was found to have a fluid collection anterior to her shoulder joint. Her shoulder repair was deffered and she had anbx beads placed. She was started on vanco/ceftriaxone. Her Cx were negative.  She has had loose BM while on anbx.  Has not been taking pain rx. Has been doing exercises. No problems with PIC.  Today is day 16 of anbx.  Takes BP at night. No headaches. Takes benzapril. When she has BP done by visitng nurses, her BP have been fine.   Has been taking pro-biotics, yogurt to help with stool.  Review of Systems  Constitutional: Negative for fever and chills.  Respiratory: Negative for shortness of breath.   Cardiovascular: Negative for chest pain.  Neurological: Positive for headaches.  her headaches are slight, not like her previous migraines.      Objective:   Physical Exam  Constitutional: She appears well-developed and well-nourished.  HENT:  Mouth/Throat: No oropharyngeal exudate.  Eyes: EOM are normal. Pupils are equal, round, and reactive to light.  Neck: Neck supple.  Cardiovascular: Normal rate, regular rhythm and normal heart sounds.   Pulmonary/Chest: Effort normal and breath sounds normal.  Abdominal: Soft. Bowel sounds are normal. She exhibits no distension. There is no tenderness.  Musculoskeletal:       Arms: Lymphadenopathy:    She has no cervical adenopathy.          Assessment & Plan:

## 2014-04-05 NOTE — Assessment & Plan Note (Addendum)
She is doing well. I reviewed her labs, her Cr is normal, her Vanco Tr is 12.8 on 10-2.  Would plan for her to complete 30 days of anbx and pull pic in 2 weeks (10-28). This is a slightly shorter therapy, but her Cx were (-).  Will see her back prn. I let her and her husband know that would be glad to see them back at any time, for any further issues with her shoulder.

## 2014-04-05 NOTE — Assessment & Plan Note (Signed)
She will call her PCP tonight to let them know about her BP. She will hope to be seen in the AM.

## 2014-04-06 ENCOUNTER — Telehealth: Payer: Self-pay | Admitting: *Deleted

## 2014-04-06 NOTE — Addendum Note (Signed)
Addended by: Landis Gandy on: 04/06/2014 04:40 PM   Modules accepted: Orders

## 2014-04-06 NOTE — Telephone Encounter (Signed)
Pt followed up with PCP today.  She was advised to double her blood pressure medication, keep an eye on her blood pressure at home, should follow up with PCP by phone Monday.  Will now take 20mg  benazepril daily.  Home health RN will be at the patient's house on Monday as well. Landis Gandy, RN

## 2014-04-06 NOTE — Telephone Encounter (Signed)
Left message asking if patient was able to follow up with her PCP regarding her elevated blood pressure.  Asked her to call back. Landis Gandy, RN

## 2014-04-07 ENCOUNTER — Telehealth: Payer: Self-pay | Admitting: *Deleted

## 2014-04-07 NOTE — Telephone Encounter (Signed)
Per Dr. Johnnye Sima, gave orders to [facility] at [place] to stop IV Abx 10/28, pull picc at end of therapy.

## 2014-04-07 NOTE — Telephone Encounter (Signed)
Patient called to advise she had uncontrolled diarrhea this morning (actually had an accident on the floor), feels it is worse than what she described at her office visit.  She states was advised to try immodium by a physician friend *if* she does not have C Diff.  Pt does not want to go to the ED. As our lab is closed, RN advised patient to contact PCP or home health nurse to see if she could give a stool sample today or over the weekend. If she is unsuccessful, pt was advised small, frequent, gentle meals; encouraged hydration; advised pt to call on-call doctor or consider Urgent Care/ED if her symptoms worsen over the weekend.  Pt agreed.  She will contact us Monday with an update. Landis Gandy, RN

## 2014-04-10 ENCOUNTER — Other Ambulatory Visit: Payer: Self-pay | Admitting: Infectious Diseases

## 2014-04-10 DIAGNOSIS — R197 Diarrhea, unspecified: Secondary | ICD-10-CM

## 2014-04-10 NOTE — Telephone Encounter (Signed)
Please chck pt for C diff PCR.

## 2014-04-10 NOTE — Telephone Encounter (Signed)
Thank you :)

## 2014-04-10 NOTE — Telephone Encounter (Signed)
Patient had another uncontrolled accident early today, still having multiple loose bowel movements/day.  It has improved some with a bland diet, however she is still concerned.  Her home health nurse is able to bring a stool kit to the house, but will need orders from [place] to have the sample tested.  Please advise.

## 2014-04-11 NOTE — Telephone Encounter (Signed)
Patient's lab work in process at [facility].  Nicole Preston will fax the results when they are available.

## 2014-04-14 NOTE — Telephone Encounter (Signed)
Yes please

## 2014-04-20 ENCOUNTER — Telehealth: Payer: Self-pay | Admitting: *Deleted

## 2014-04-20 NOTE — Telephone Encounter (Signed)
Left message with [facility]. Still have not received the results from c Diff lab ordered 10/19.

## 2014-04-24 NOTE — Telephone Encounter (Addendum)
RN called [facility] back again today looking for stool results for sample 10/19.  Per Kendrick Fries at [place], c Diff was negative.  She will fax the results to [phone]. Landis Gandy, RN

## 2014-04-26 ENCOUNTER — Encounter: Payer: Self-pay | Admitting: Infectious Diseases

## 2014-12-29 ENCOUNTER — Other Ambulatory Visit (HOSPITAL_COMMUNITY): Payer: Self-pay | Admitting: Urology

## 2014-12-29 DIAGNOSIS — IMO0002 Reserved for concepts with insufficient information to code with codable children: Secondary | ICD-10-CM

## 2015-01-09 ENCOUNTER — Other Ambulatory Visit: Payer: Self-pay | Admitting: Urology

## 2015-01-09 LAB — BASIC METABOLIC PANEL
ANION GAP: 7 (ref 5–15)
BUN: 14 mg/dL (ref 6–20)
CO2: 26 mmol/L (ref 22–32)
Calcium: 9.7 mg/dL (ref 8.9–10.3)
Chloride: 94 mmol/L — ABNORMAL LOW (ref 101–111)
Creatinine, Ser: 1.04 mg/dL — ABNORMAL HIGH (ref 0.44–1.00)
GFR calc Af Amer: 58 mL/min — ABNORMAL LOW (ref >60)
GFR calc non Af Amer: 50 mL/min — ABNORMAL LOW (ref >60)
Glucose, Bld: 99 mg/dL (ref 65–99)
POTASSIUM: 4.7 mmol/L (ref 3.5–5.1)
Sodium: 127 mmol/L — ABNORMAL LOW (ref 135–145)

## 2015-01-10 ENCOUNTER — Encounter (HOSPITAL_COMMUNITY): Payer: Self-pay

## 2015-01-10 ENCOUNTER — Ambulatory Visit (HOSPITAL_COMMUNITY)
Admission: RE | Admit: 2015-01-10 | Discharge: 2015-01-10 | Disposition: A | Discharge status: Home / Self Care | Payer: Medicare Other | Source: Ambulatory Visit | Attending: Urology | Admitting: Urology

## 2015-01-10 DIAGNOSIS — M4856XA Collapsed vertebra, not elsewhere classified, lumbar region, initial encounter for fracture: Secondary | ICD-10-CM | POA: Diagnosis not present

## 2015-01-10 DIAGNOSIS — R918 Other nonspecific abnormal finding of lung field: Secondary | ICD-10-CM | POA: Insufficient documentation

## 2015-01-10 DIAGNOSIS — Z905 Acquired absence of kidney: Secondary | ICD-10-CM | POA: Diagnosis not present

## 2015-01-10 DIAGNOSIS — I7090 Unspecified atherosclerosis: Secondary | ICD-10-CM | POA: Insufficient documentation

## 2015-01-10 DIAGNOSIS — IMO0002 Reserved for concepts with insufficient information to code with codable children: Secondary | ICD-10-CM

## 2015-01-10 DIAGNOSIS — Z85528 Personal history of other malignant neoplasm of kidney: Secondary | ICD-10-CM | POA: Diagnosis not present

## 2015-01-10 DIAGNOSIS — M549 Dorsalgia, unspecified: Secondary | ICD-10-CM | POA: Diagnosis present

## 2015-01-10 MED ORDER — IOHEXOL 300 MG/ML  SOLN
100.0000 mL | Freq: Once | INTRAMUSCULAR | Status: AC | PRN
  Administered 2015-01-10: 100 mL via INTRAVENOUS

## 2015-09-17 ENCOUNTER — Other Ambulatory Visit: Payer: Self-pay | Admitting: Urology

## 2015-09-17 DIAGNOSIS — C649 Malignant neoplasm of unspecified kidney, except renal pelvis: Secondary | ICD-10-CM

## 2015-09-28 ENCOUNTER — Other Ambulatory Visit: Payer: Medicare Other

## 2015-09-28 ENCOUNTER — Ambulatory Visit
Admission: RE | Admit: 2015-09-28 | Discharge: 2015-09-28 | Disposition: A | Discharge status: Home / Self Care | Payer: Medicare Other | Source: Ambulatory Visit | Attending: Urology | Admitting: Urology

## 2015-09-28 DIAGNOSIS — C649 Malignant neoplasm of unspecified kidney, except renal pelvis: Secondary | ICD-10-CM

## 2015-09-28 MED ORDER — GADOBENATE DIMEGLUMINE 529 MG/ML IV SOLN
13.0000 mL | Freq: Once | INTRAVENOUS | Status: AC | PRN
  Administered 2015-09-28: 13 mL via INTRAVENOUS

## 2015-11-25 ENCOUNTER — Emergency Department (HOSPITAL_COMMUNITY)
Admission: EM | Admit: 2015-11-25 | Discharge: 2015-11-25 | Disposition: A | Discharge status: Home / Self Care | Payer: Medicare Other | Attending: Emergency Medicine | Admitting: Emergency Medicine

## 2015-11-25 ENCOUNTER — Telehealth (HOSPITAL_BASED_OUTPATIENT_CLINIC_OR_DEPARTMENT_OTHER): Payer: Self-pay

## 2015-11-25 ENCOUNTER — Emergency Department (HOSPITAL_COMMUNITY): Payer: Medicare Other

## 2015-11-25 ENCOUNTER — Encounter (HOSPITAL_COMMUNITY): Payer: Self-pay | Admitting: Emergency Medicine

## 2015-11-25 DIAGNOSIS — Y998 Other external cause status: Secondary | ICD-10-CM | POA: Insufficient documentation

## 2015-11-25 DIAGNOSIS — Q782 Osteopetrosis: Secondary | ICD-10-CM | POA: Insufficient documentation

## 2015-11-25 DIAGNOSIS — Z8719 Personal history of other diseases of the digestive system: Secondary | ICD-10-CM | POA: Insufficient documentation

## 2015-11-25 DIAGNOSIS — Y9289 Other specified places as the place of occurrence of the external cause: Secondary | ICD-10-CM | POA: Insufficient documentation

## 2015-11-25 DIAGNOSIS — Z7982 Long term (current) use of aspirin: Secondary | ICD-10-CM | POA: Insufficient documentation

## 2015-11-25 DIAGNOSIS — Y9389 Activity, other specified: Secondary | ICD-10-CM | POA: Insufficient documentation

## 2015-11-25 DIAGNOSIS — S42352A Displaced comminuted fracture of shaft of humerus, left arm, initial encounter for closed fracture: Secondary | ICD-10-CM | POA: Diagnosis not present

## 2015-11-25 DIAGNOSIS — Z79899 Other long term (current) drug therapy: Secondary | ICD-10-CM | POA: Insufficient documentation

## 2015-11-25 DIAGNOSIS — J449 Chronic obstructive pulmonary disease, unspecified: Secondary | ICD-10-CM | POA: Insufficient documentation

## 2015-11-25 DIAGNOSIS — W010XXA Fall on same level from slipping, tripping and stumbling without subsequent striking against object, initial encounter: Secondary | ICD-10-CM | POA: Insufficient documentation

## 2015-11-25 DIAGNOSIS — M1990 Unspecified osteoarthritis, unspecified site: Secondary | ICD-10-CM | POA: Insufficient documentation

## 2015-11-25 DIAGNOSIS — I10 Essential (primary) hypertension: Secondary | ICD-10-CM | POA: Diagnosis not present

## 2015-11-25 DIAGNOSIS — Z85528 Personal history of other malignant neoplasm of kidney: Secondary | ICD-10-CM | POA: Diagnosis not present

## 2015-11-25 DIAGNOSIS — M810 Age-related osteoporosis without current pathological fracture: Secondary | ICD-10-CM | POA: Insufficient documentation

## 2015-11-25 DIAGNOSIS — Z8619 Personal history of other infectious and parasitic diseases: Secondary | ICD-10-CM | POA: Insufficient documentation

## 2015-11-25 DIAGNOSIS — M79602 Pain in left arm: Secondary | ICD-10-CM

## 2015-11-25 DIAGNOSIS — Z87891 Personal history of nicotine dependence: Secondary | ICD-10-CM | POA: Diagnosis not present

## 2015-11-25 DIAGNOSIS — S59912A Unspecified injury of left forearm, initial encounter: Secondary | ICD-10-CM | POA: Diagnosis present

## 2015-11-25 DIAGNOSIS — S42302A Unspecified fracture of shaft of humerus, left arm, initial encounter for closed fracture: Secondary | ICD-10-CM

## 2015-11-25 MED ORDER — ACETAMINOPHEN 500 MG PO TABS
1000.0000 mg | ORAL_TABLET | Freq: Once | ORAL | Status: AC
  Administered 2015-11-25: 1000 mg via ORAL
  Filled 2015-11-25: qty 2

## 2015-11-25 MED ORDER — OXYCODONE HCL 5 MG PO TABS: 5.0000 mg | ORAL_TABLET | ORAL | Status: DC | PRN

## 2015-11-25 MED ORDER — OXYCODONE HCL 5 MG PO TABS
5.0000 mg | ORAL_TABLET | Freq: Once | ORAL | Status: AC
  Administered 2015-11-25: 5 mg via ORAL
  Filled 2015-11-25: qty 1

## 2015-11-25 MED ORDER — MORPHINE SULFATE (PF) 2 MG/ML IV SOLN
2.0000 mg | Freq: Once | INTRAVENOUS | Status: AC
  Administered 2015-11-25: 2 mg via INTRAVENOUS
  Filled 2015-11-25: qty 1

## 2015-11-25 MED ORDER — DIAZEPAM 2 MG PO TABS: 5.0000 mg | ORAL_TABLET | Freq: Two times a day (BID) | ORAL | Status: DC

## 2015-11-25 MED ORDER — ONDANSETRON HCL 4 MG/2ML IJ SOLN
4.0000 mg | Freq: Once | INTRAMUSCULAR | Status: AC
  Administered 2015-11-25: 4 mg via INTRAVENOUS
  Filled 2015-11-25: qty 2

## 2015-11-25 NOTE — ED Notes (Signed)
Ortho tech paged for left arm sling.

## 2015-11-25 NOTE — ED Notes (Signed)
Per [facility] patient tripped and fell at home, patient complains of left arm pain.  Denies LOC.  Pulse and feeling intact in left arm, left arm in sling from EMS.  Patient alert and oriented at this time.

## 2015-11-25 NOTE — ED Provider Notes (Signed)
CSN: [id number]     Arrival date & time 11/25/15  1247 History   First MD Initiated Contact with Patient 11/25/15 1253     Chief Complaint  Patient presents with  . Fall     (Consider location/radiation/quality/duration/timing/severity/associated sxs/prior Treatment) Patient is a 82 y.o. female presenting with fall. The history is provided by the patient.  Fall This is a new problem. The current episode started less than 1 hour ago. The problem occurs constantly. The problem has not changed since onset.Pertinent negatives include no chest pain, no abdominal pain, no headaches and no shortness of breath. Nothing aggravates the symptoms. Nothing relieves the symptoms. She has tried nothing for the symptoms. The treatment provided no relief.    82 yo F with a cc of a fall while walking.  Tripped over her feet.  Landed on left shoulder, complaining of left shoulder pain. No numbness or tingling.  Denies Head injury or loss consciousness. Denies abdominal pain chest pain back pain.  Past Medical History  Diagnosis Date  . Osteopetrosis   . S/p nephrectomy   . Hemorrhoids   . Hypertension   . COPD (chronic obstructive pulmonary disease) ([place])   . Renal cancer ([place]) 4/12    rt nephrectomy  . Arthritis   . Osteoporosis   . Infection 02/2014    RT SHOULDER   Past Surgical History  Procedure Laterality Date  . Abdominal surgery  2012    for renal carcinoma tumor  . Abdominal hysterectomy    . Colonoscopy  12/29/2011    Procedure: COLONOSCOPY;  Surgeon: Inda Castle, MD;  Location: [facility];  Service: Endoscopy;  Laterality: N/A;  . Nephrectomy  2012    RT KIDNEY  . Irrigation and debridement shoulder Right 03/21/2014    Procedure: IRRIGATION AND DEBRIDEMENT SHOULDER, BICEPS TENODESIS;  Surgeon: Meredith Pel, MD;  Location: [place];  Service: Orthopedics;  Laterality: Right;  I&D RIGHT SHOULDER WITH BICEPS TENODESIS  . Bicept tenodesis Right 03/21/2014    Procedure: BICEPS  TENODESIS;  Surgeon: Meredith Pel, MD;  Location: [place];  Service: Orthopedics;  Laterality: Right;   Family History  Problem Relation Age of Onset  . Colon cancer Neg Hx   . Kidney cancer Mother    Social History  Substance Use Topics  . Smoking status: Former Smoker -- 1.00 packs/day    Types: Cigarettes    Quit date: 12/29/1983  . Smokeless tobacco: Never Used  . Alcohol Use: 8.4 oz/week    14 Glasses of wine per week     Comment: 1-2 glasses of wine/day   OB History    No data available     Review of Systems  Constitutional: Negative for fever and chills.  HENT: Negative for congestion and rhinorrhea.   Eyes: Negative for redness and visual disturbance.  Respiratory: Negative for shortness of breath and wheezing.   Cardiovascular: Negative for chest pain and palpitations.  Gastrointestinal: Negative for nausea, vomiting and abdominal pain.  Genitourinary: Negative for dysuria and urgency.  Musculoskeletal: Positive for myalgias and arthralgias.  Skin: Negative for pallor and wound.  Neurological: Negative for dizziness and headaches.      Allergies  Review of patient's allergies indicates no known allergies.  Home Medications   Prior to Admission medications   Medication Sig Start Date End Date Taking? Authorizing Provider  amLODipine (NORVASC) 10 MG tablet Take 10 mg by mouth daily.   Yes Historical Provider, MD  benazepril (LOTENSIN) 20 MG tablet  Take 20 mg by mouth daily.   Yes Historical Provider, MD  cholecalciferol (VITAMIN D) 1000 UNITS tablet Take 1,000 Units by mouth daily.    Yes Historical Provider, MD  diphenhydrAMINE (BENADRYL) 25 MG tablet Take 12.5 mg by mouth at bedtime as needed for sleep.   Yes Historical Provider, MD  LYSINE PO Take 1 tablet by mouth daily.   Yes Historical Provider, MD  aspirin 325 MG tablet Take 1 tablet (325 mg total) by mouth daily. Patient not taking: Reported on 11/25/2015 03/22/14   Meredith Pel, MD  cefTRIAXone  2 g in dextrose 5 % 50 mL Inject 2 g into the vein daily. Patient not taking: Reported on 11/25/2015 03/22/14   Meredith Pel, MD  diazepam (VALIUM) 2 MG tablet Take 2.5 tablets (5 mg total) by mouth 2 (two) times daily. 11/25/15   Deno Etienne, DO  HYDROcodone-acetaminophen (NORCO/VICODIN) 5-325 MG per tablet Take 1-2 tablets by mouth every 4 (four) hours as needed for moderate pain. Patient not taking: Reported on 11/25/2015 03/22/14   Meredith Pel, MD  oxyCODONE (ROXICODONE) 5 MG immediate release tablet Take 1 tablet (5 mg total) by mouth every 4 (four) hours as needed for severe pain. 11/25/15   Deno Etienne, DO  vancomycin 500 mg in sodium chloride 0.9 % 100 mL Inject 500 mg into the vein every 12 (twelve) hours. Patient not taking: Reported on 11/25/2015 03/22/14   Meredith Pel, MD   BP 130/53 mmHg  Pulse 71  Temp(Src) 97.8 F (36.6 C) (Oral)  Resp 14  SpO2 99% Physical Exam  Constitutional: She is oriented to person, place, and time. She appears well-developed and well-nourished. No distress.  HENT:  Head: Normocephalic and atraumatic.  Eyes: EOM are normal. Pupils are equal, round, and reactive to light.  Neck: Normal range of motion. Neck supple.  Cardiovascular: Normal rate and regular rhythm.  Exam reveals no gallop and no friction rub.   No murmur heard. Pulmonary/Chest: Effort normal. She has no wheezes. She has no rales.  Abdominal: Soft. She exhibits no distension. There is no tenderness.  Musculoskeletal: She exhibits no edema or tenderness.  Neurological: She is alert and oriented to person, place, and time.  Skin: Skin is warm and dry. She is not diaphoretic.  Psychiatric: She has a normal mood and affect. Her behavior is normal.  Nursing note and vitals reviewed.   ED Course  Procedures (including critical care time) Labs Review Labs Reviewed - No data to display  Imaging Review Dg Elbow Complete Left  11/25/2015  CLINICAL DATA:  Acute left elbow pain following  fall. Initial encounter. EXAM: LEFT ELBOW - COMPLETE 3+ VIEW COMPARISON:  None. FINDINGS: There is no evidence of fracture, dislocation, or joint effusion. There is no evidence of arthropathy or other focal bone abnormality. Soft tissues are unremarkable. IMPRESSION: Negative. Electronically Signed   By: Margarette Canada M.D.   On: 11/25/2015 15:13   Dg Forearm Left  11/25/2015  CLINICAL DATA:  Acute left forearm pain following fall. Initial encounter. EXAM: LEFT FOREARM - 2 VIEW COMPARISON:  None. FINDINGS: There is no evidence of fracture or other focal bone lesions. Soft tissues are unremarkable. IMPRESSION: Negative. Electronically Signed   By: Margarette Canada M.D.   On: 11/25/2015 15:15   Dg Shoulder Left  11/25/2015  CLINICAL DATA:  Acute left shoulder pain following fall. Initial encounter. EXAM: LEFT SHOULDER - 2+ VIEW COMPARISON:  None. FINDINGS: A humeral neck fracture is identified  with apex anterior angulation. This fracture involves the greater tuberosity and may extend to the articular surface There is no evidence of dislocation. No other fractures are. IMPRESSION: Humeral neck fracture with apex anterior angulation, involving the greater tuberosity and may extend to the articular surface. Electronically Signed   By: Margarette Canada M.D.   On: 11/25/2015 15:17   Dg Humerus Left  11/25/2015  CLINICAL DATA:  Fall. EXAM: LEFT HUMERUS - 2+ VIEW COMPARISON:  11/25/2015 FINDINGS: There is an acute and comminuted fracture deformity involving the surgical neck and greater tuberosity of the proximal humerus. The fracture fragments are in near anatomic alignment. No dislocations. IMPRESSION: Acute comminuted fracture involves the proximal shaft of the humerus. Electronically Signed   By: Kerby Moors M.D.   On: 11/25/2015 15:12   Dg Foot Complete Left  11/25/2015  CLINICAL DATA:  Acute left foot pain following fall. Initial encounter. EXAM: LEFT FOOT - COMPLETE 3+ VIEW COMPARISON:  None. FINDINGS: There is no  evidence of acute fracture, subluxation or dislocation. No focal bony lesions are present. The Lisfranc joints are unremarkable. IMPRESSION: No acute abnormality. Electronically Signed   By: Margarette Canada M.D.   On: 11/25/2015 15:14   I have personally reviewed and evaluated these images and lab results as part of my medical decision-making.   EKG Interpretation None      MDM   Final diagnoses:  Left arm pain  Fx humerus shaft-closed, left, initial encounter    82 yo F with a chief complaint of a fall. Pain and swelling located to the proximal humerus. X-ray consistent with a comminuted left proximal humerus fracture. Discussed with Dr. Sharol Given, will have the patient placed in sling and follow-up with her orthopedist Dr. Marlou Sa in the office.  4:38 PM:  I have discussed the diagnosis/risks/treatment options with the patient and family and believe the pt to be eligible for discharge home to follow-up with Ortho. We also discussed returning to the ED immediately if new or worsening sx occur. We discussed the sx which are most concerning (e.g., sudden worsening pain, fever, inability to tolerate by mouth) that necessitate immediate return. Medications administered to the patient during their visit and any new prescriptions provided to the patient are listed below.  Medications given during this visit Medications  oxyCODONE (Oxy IR/ROXICODONE) immediate release tablet 5 mg (not administered)  acetaminophen (TYLENOL) tablet 1,000 mg (1,000 mg Oral Given 11/25/15 1408)  morphine 2 MG/ML injection 2 mg (2 mg Intravenous Given 11/25/15 1407)  ondansetron (ZOFRAN) injection 4 mg (4 mg Intravenous Given 11/25/15 1408)    New Prescriptions   DIAZEPAM (VALIUM) 2 MG TABLET    Take 2.5 tablets (5 mg total) by mouth 2 (two) times daily.   OXYCODONE (ROXICODONE) 5 MG IMMEDIATE RELEASE TABLET    Take 1 tablet (5 mg total) by mouth every 4 (four) hours as needed for severe pain.    The patient appears reasonably  screen and/or stabilized for discharge and I doubt any other medical condition or other [facility] requiring further screening, evaluation, or treatment in the ED at this time prior to discharge.      Deno Etienne, DO 11/25/15 [phone]

## 2015-11-25 NOTE — Discharge Instructions (Signed)
Take 4 over the counter ibuprofen tablets 3 times a day or 2 over-the-counter naproxen tablets twice a day for pain.  Humerus Fracture Treated With Immobilization The humerus is the large bone in your upper arm. You have a broken (fractured) humerus. These fractures are easily diagnosed with X-rays. TREATMENT  Simple fractures which will heal without disability are treated with simple immobilization. Immobilization means you will wear a cast, splint, or sling. You have a fracture which will do well with immobilization. The fracture will heal well simply by being held in a good position until it is stable enough to begin range of motion exercises. Do not take part in activities which would further injure your arm.  HOME CARE INSTRUCTIONS   Put ice on the injured area.  Put ice in a plastic bag.  Place a towel between your skin and the bag.  Leave the ice on for 15-20 minutes, 03-04 times a day.  If you have a cast:  Do not scratch the skin under the cast using sharp or pointed objects.  Check the skin around the cast every day. You may put lotion on any red or sore areas.  Keep your cast dry and clean.  If you have a splint:  Wear the splint as directed.  Keep your splint dry and clean.  You may loosen the elastic around the splint if your fingers become numb, tingle, or turn cold or blue.  If you have a sling:  Wear the sling as directed.  Do not put pressure on any part of your cast or splint until it is fully hardened.  Your cast or splint can be protected during bathing with a plastic bag. Do not lower the cast or splint into water.  Only take over-the-counter or prescription medicines for pain, discomfort, or fever as directed by your caregiver.  Do range of motion exercises as instructed by your caregiver.  Follow up as directed by your caregiver. This is very important in order to avoid permanent injury or disability and chronic pain. SEEK IMMEDIATE MEDICAL CARE IF:    Your skin or nails in the injured arm turn blue or gray.  Your arm feels cold or numb.  You develop severe pain in the injured arm.  You are having problems with the medicines you were given. MAKE SURE YOU:   Understand these instructions.  Will watch your condition.  Will get help right away if you are not doing well or get worse.   This information is not intended to replace advice given to you by your health care provider. Make sure you discuss any questions you have with your health care provider.   Document Released: 09/15/2000 Document Revised: 06/30/2014 Document Reviewed: 11/01/2014 Elsevier Interactive Patient Education [employer].

## 2015-11-25 NOTE — Progress Notes (Signed)
Orthopedic Tech Progress Note Patient Details:  Nicole Preston 12/16/1933 [id number] Per verbal order of ED MD, applied shoulder immobilizer sling to LUE. Ortho Devices Type of Ortho Device: Sling immobilizer Ortho Device/Splint Location: LUE Ortho Device/Splint Interventions: Application   [name] 11/25/2015, 5:05 PM

## 2015-11-28 ENCOUNTER — Other Ambulatory Visit: Payer: Self-pay | Admitting: Orthopedic Surgery

## 2015-11-28 DIAGNOSIS — M25512 Pain in left shoulder: Secondary | ICD-10-CM

## 2015-11-30 ENCOUNTER — Ambulatory Visit
Admission: RE | Admit: 2015-11-30 | Discharge: 2015-11-30 | Disposition: A | Discharge status: Home / Self Care | Payer: Medicare Other | Source: Ambulatory Visit | Attending: Orthopedic Surgery | Admitting: Orthopedic Surgery

## 2015-11-30 DIAGNOSIS — M25512 Pain in left shoulder: Secondary | ICD-10-CM

## 2016-04-30 ENCOUNTER — Emergency Department: Payer: No Typology Code available for payment source

## 2016-04-30 ENCOUNTER — Inpatient Hospital Stay
Admission: EM | Admit: 2016-04-30 | Discharge: 2016-05-02 | DRG: 683 | Disposition: A | Discharge status: Home / Self Care | Payer: No Typology Code available for payment source | Attending: Internal Medicine | Admitting: Internal Medicine

## 2016-04-30 ENCOUNTER — Inpatient Hospital Stay: Payer: No Typology Code available for payment source | Admitting: Geriatric Medicine

## 2016-04-30 DIAGNOSIS — K529 Noninfective gastroenteritis and colitis, unspecified: Secondary | ICD-10-CM | POA: Insufficient documentation

## 2016-04-30 DIAGNOSIS — F411 Generalized anxiety disorder: Secondary | ICD-10-CM | POA: Diagnosis present

## 2016-04-30 DIAGNOSIS — R1030 Lower abdominal pain, unspecified: Secondary | ICD-10-CM

## 2016-04-30 DIAGNOSIS — E871 Hypo-osmolality and hyponatremia: Secondary | ICD-10-CM | POA: Diagnosis present

## 2016-04-30 DIAGNOSIS — I447 Left bundle-branch block, unspecified: Secondary | ICD-10-CM

## 2016-04-30 DIAGNOSIS — Z87891 Personal history of nicotine dependence: Secondary | ICD-10-CM

## 2016-04-30 DIAGNOSIS — R42 Dizziness and giddiness: Secondary | ICD-10-CM

## 2016-04-30 DIAGNOSIS — E860 Dehydration: Secondary | ICD-10-CM | POA: Diagnosis present

## 2016-04-30 DIAGNOSIS — Z905 Acquired absence of kidney: Secondary | ICD-10-CM

## 2016-04-30 DIAGNOSIS — R197 Diarrhea, unspecified: Secondary | ICD-10-CM

## 2016-04-30 DIAGNOSIS — R55 Syncope and collapse: Secondary | ICD-10-CM

## 2016-04-30 DIAGNOSIS — N3090 Cystitis, unspecified without hematuria: Secondary | ICD-10-CM

## 2016-04-30 DIAGNOSIS — Z85528 Personal history of other malignant neoplasm of kidney: Secondary | ICD-10-CM

## 2016-04-30 DIAGNOSIS — R110 Nausea: Secondary | ICD-10-CM

## 2016-04-30 DIAGNOSIS — I10 Essential (primary) hypertension: Secondary | ICD-10-CM

## 2016-04-30 DIAGNOSIS — Z79899 Other long term (current) drug therapy: Secondary | ICD-10-CM

## 2016-04-30 DIAGNOSIS — M810 Age-related osteoporosis without current pathological fracture: Secondary | ICD-10-CM | POA: Diagnosis present

## 2016-04-30 DIAGNOSIS — N3000 Acute cystitis without hematuria: Secondary | ICD-10-CM

## 2016-04-30 DIAGNOSIS — N179 Acute kidney failure, unspecified: Principal | ICD-10-CM | POA: Diagnosis present

## 2016-04-30 HISTORY — DX: Age-related osteoporosis without current pathological fracture: M81.0

## 2016-04-30 HISTORY — DX: Essential (primary) hypertension: I10

## 2016-04-30 HISTORY — DX: Malignant neoplasm of unspecified kidney, except renal pelvis: C64.9

## 2016-04-30 HISTORY — DX: Fatigue fracture of vertebra, lumbar region, initial encounter for fracture: M48.46XA

## 2016-04-30 LAB — GFR: EGFR: 39.4

## 2016-04-30 LAB — CBC AND DIFFERENTIAL
Absolute NRBC: 0 10*3/uL
Basophils Absolute Automated: 0.08 10*3/uL (ref 0.00–0.20)
Basophils Automated: 0.6 %
Eosinophils Absolute Automated: 0.09 10*3/uL (ref 0.00–0.70)
Eosinophils Automated: 0.7 %
Hematocrit: 38.9 % (ref 37.0–47.0)
Hgb: 13.1 g/dL (ref 12.0–16.0)
Immature Granulocytes Absolute: 0.05 10*3/uL
Immature Granulocytes: 0.4 %
Lymphocytes Absolute Automated: 1.7 10*3/uL (ref 0.50–4.40)
Lymphocytes Automated: 12.8 %
MCH: 30.5 pg (ref 28.0–32.0)
MCHC: 33.7 g/dL (ref 32.0–36.0)
MCV: 90.7 fL (ref 80.0–100.0)
MPV: 8.8 fL — ABNORMAL LOW (ref 9.4–12.3)
Monocytes Absolute Automated: 0.91 10*3/uL (ref 0.00–1.20)
Monocytes: 6.9 %
Neutrophils Absolute: 10.4 10*3/uL — ABNORMAL HIGH (ref 1.80–8.10)
Neutrophils: 78.6 %
Nucleated RBC: 0 /100 WBC (ref 0.0–1.0)
Platelets: 348 10*3/uL (ref 140–400)
RBC: 4.29 10*6/uL (ref 4.20–5.40)
RDW: 13 % (ref 12–15)
WBC: 13.23 10*3/uL — ABNORMAL HIGH (ref 3.50–10.80)

## 2016-04-30 LAB — URINALYSIS, REFLEX TO MICROSCOPIC EXAM IF INDICATED
Bilirubin, UA: NEGATIVE
Glucose, UA: NEGATIVE
Nitrite, UA: NEGATIVE
Protein, UR: 30 — AB
Specific Gravity UA: >1.035 — AB (ref 1.001–1.035)
Urine pH: 5 (ref 5.0–8.0)
Urobilinogen, UA: 2 mg/dL

## 2016-04-30 LAB — TROPONIN I: Troponin I: 0.01 ng/mL (ref 0.00–0.09)

## 2016-04-30 LAB — COMPREHENSIVE METABOLIC PANEL
ALT: 11 U/L (ref 0–55)
AST (SGOT): 19 U/L (ref 5–34)
Albumin/Globulin Ratio: 1.4 (ref 0.9–2.2)
Albumin: 4.2 g/dL (ref 3.5–5.0)
Alkaline Phosphatase: 85 U/L (ref 37–106)
Anion Gap: 13 (ref 5.0–15.0)
BUN: 21 mg/dL — ABNORMAL HIGH (ref 7.0–19.0)
Bilirubin, Total: 0.6 mg/dL (ref 0.2–1.2)
CO2: 18 mEq/L — ABNORMAL LOW (ref 22–29)
Calcium: 9.9 mg/dL (ref 7.9–10.2)
Chloride: 104 mEq/L (ref 100–111)
Creatinine: 1.3 mg/dL — ABNORMAL HIGH (ref 0.6–1.0)
Globulin: 3.1 g/dL (ref 2.0–3.6)
Glucose: 116 mg/dL — ABNORMAL HIGH (ref 70–100)
Potassium: 4.4 mEq/L (ref 3.5–5.1)
Protein, Total: 7.3 g/dL (ref 6.0–8.3)
Sodium: 135 mEq/L — ABNORMAL LOW (ref 136–145)

## 2016-04-30 LAB — PT/INR
PT INR: 1 (ref 0.9–1.1)
PT: 13.3 s (ref 12.6–15.0)

## 2016-04-30 LAB — LIPASE: Lipase: 36 U/L (ref 8–78)

## 2016-04-30 LAB — HEMOLYSIS INDEX: Hemolysis Index: 9 (ref 0–18)

## 2016-04-30 MED ORDER — SULFAMETHOXAZOLE-TRIMETHOPRIM 800-160 MG PO TABS
1.0000 | ORAL_TABLET | Freq: Once | ORAL | Status: AC
Start: 2016-04-30 — End: 2016-04-30
  Administered 2016-04-30: 1 via ORAL
  Filled 2016-04-30: qty 1

## 2016-04-30 MED ORDER — IODIXANOL 320 MG/ML IV SOLN
100.0000 mL | Freq: Once | INTRAVENOUS | Status: AC | PRN
Start: 2016-04-30 — End: 2016-04-30
  Administered 2016-04-30: 100 mL via INTRAVENOUS

## 2016-04-30 MED ORDER — METRONIDAZOLE IN NACL 500 MG/100 ML IV SOLN
500.0000 mg | Freq: Once | INTRAVENOUS | Status: AC
Start: 2016-04-30 — End: 2016-04-30
  Administered 2016-04-30: 500 mg via INTRAVENOUS
  Filled 2016-04-30: qty 100

## 2016-04-30 MED ORDER — SODIUM CHLORIDE 0.9 % IV BOLUS
1000.0000 mL | Freq: Once | INTRAVENOUS | Status: AC
Start: 2016-04-30 — End: 2016-04-30
  Administered 2016-04-30: 1000 mL via INTRAVENOUS

## 2016-04-30 MED ORDER — ONDANSETRON HCL 4 MG/2ML IJ SOLN
4.0000 mg | Freq: Once | INTRAMUSCULAR | Status: AC
Start: 2016-04-30 — End: 2016-04-30
  Administered 2016-04-30: 4 mg via INTRAVENOUS
  Filled 2016-04-30: qty 2

## 2016-04-30 MED ORDER — MORPHINE SULFATE 4 MG/ML IJ/IV SOLN (WRAP)
4.0000 mg | Freq: Once | Status: AC
Start: 2016-04-30 — End: 2016-04-30
  Administered 2016-04-30: 4 mg via INTRAVENOUS
  Filled 2016-04-30: qty 1

## 2016-04-30 NOTE — ED Provider Notes (Signed)
EMERGENCY DEPARTMENT HISTORY AND PHYSICAL EXAM     Physician/Midlevel provider first contact with patient: 04/30/16 1805         Date: 04/30/2016  Patient Name: Gina Cooper  Attending Physician: Gweneth Dimitri DO  Diagnosis and Treatment Plan       Clinical Impression:   1. Lower abdominal pain    2. Nausea    3. Diarrhea, unspecified type    4. Left bundle branch block    5. Near syncope    6. Cystitis        Treatment Plan:   ED Disposition     ED Disposition Condition Date/Time Comment    Admit  Wed Apr 30, 2016  9:02 PM Admitting Physician: Rolan Lipa A [[id number]]   Diagnosis: Colitis [213086]   Estimated Length of Stay: > or = to 2 midnights   Tentative Discharge Plan?: Home or Self Care [1]   Patient Class: Inpatient [101]            History of Presenting Illness     Chief Complaint   Patient presents with   . Abdominal Pain   . Near syncope       History Provided By: Patient and EMS  Chief Complaint: Abdominal pain  Onset: Between 4 PM and 5 PM today  Timing: Constant, waxing and waning  Location: Periumbilical  Severity: Moderate  Exacerbating factors: None  Alleviating factors: None  Associated symptoms: Nausea, diarrhea and SOB  Pertinent negatives: No diaphoresis, CP, vomiting, leg swelling, calf pain or hemoptysis    Additional History: Gina Cooper is a 82 y.o. female BIBA with constant, waxing and waning periumbilical abdominal pain that began between 4 PM and 5 PM today. Pt was walking when she became SOB and felt like she was going to pass out. She then started having abdominal pain. Pt's pain was initially strong and gradually worsened. Her abdominal pain improved after having two episodes of diarrhea. Pt also has nausea but no vomiting.  Pt denies diaphoresis, CP, vomiting, leg swelling, calf pain or hemoptysis. She no longer has SOB. Pt recalls seeing her cardiologist several years ago and having an abnormal EKG. The abnormality could have been a LBBB, but she is not sure. Pt last had a  cardiac stress test 10 years ago. She has no history of MI or blood clots. Pt is visiting from [place] to take care of her daughter after her daughter had knee surgery.     Cardiac risk factors: The patient has history of: HTN  The patient denies any history of CAD, MI, hyperlipidemia, diabetes, cocaine use, smoking, or family history of CAD at a young age.    PE risk factors: The patient has history of: cancer, recent travel  The patient denies any recent surgery, major trauma, immobility, prolonged travel, or hormone therapy, as well as denying any history of clotting disorders or personal history of venous thromboembolisms.    PCP: Pcp, Noneorunknown, MD  Cardiologist : Unknown in [place]      Current Facility-Administered Medications:   .  metroNIDAZOLE (FLAGYL) 500 mg IVPB (premix), 500 mg, Intravenous, Once, Madaline Brilliant, DO, Last Rate: 100 mL/hr at 04/30/16 2056, 500 mg at 04/30/16 2056    Current Outpatient Prescriptions:   .  amLODIPine (NORVASC) 5 MG tablet, Take 5 mg by mouth daily., Disp: , Rfl:   .  benazepril (LOTENSIN) 20 MG tablet, Take 20 mg by mouth daily., Disp: , Rfl:  Past Medical History     Past Medical History:   Diagnosis Date   . Cancer of kidney    . Hypertension    . Lumbar stress fracture    . Osteoporosis      Past Surgical History:   Procedure Laterality Date   . ABDOMINAL SURGERY     . INSERTION, VENA CAVA FILTER         Family History     History reviewed. No pertinent family history.    Social History     Social History     Social History   . Marital status: Married     Spouse name: N/A   . Number of children: N/A   . Years of education: N/A     Social History Main Topics   . Smoking status: Former Smoker     Types: Cigarettes   . Smokeless tobacco: Never Used   . Alcohol use 1.2 oz/week     2 Standard drinks or equivalent per week   . Drug use: No   . Sexual activity: Not on file     Other Topics Concern   . Not on file     Social History Narrative   .  No narrative on file        Allergies     No Known Allergies    Review of Systems     General: No fever, no sweats, no chills.   HENT: No headache, no neck pain, no cold symptoms.  Respiratory: Positive for transient SOB. No cough, no hemoptysis, no wheezing.  Cardiovascular: No chest pain, no calf pain, no leg swelling.   Gastrointestinal: Positive for abdominal pain and nausea. No vomiting, no diarrhea.   Genito-Urinary: No dysuria, no hematuria  Musculoskeletal: No arm pain. No back pain, no neck pain.    Neurological: No new focal weakness, no sensory changes.   Dermatological: No new rashes, no color changes.   Psychological: No acute mood changes, no confusion.     Physical Exam     BP 135/61   Pulse 68   Temp 98 F (36.7 C) (Oral)   Resp 18   Ht 5\' 4"  (1.626 m)   Wt 63 kg   SpO2 99%   BMI 23.86 kg/m     Constitutional: Vital signs reviewed. Well appearing, no apparent distress. No diaphoresis.   Head: Normocephalic, atraumatic. No external trauma noted.  Eyes: Conjunctiva and sclera are normal. Pupils equal, round, reactive.  Ear, Nose, Throat:  Normal external examination of the nose and ears. Oropharynx clear, moist mucous membranes. No tonsillar swelling or exudates.   Neck: Supple. Trachea midline.  No cervical lymphadenopathy. No midline cervical spine tenderness.  Respiratory: Clear to auscultation. No respiratory distress. No wheezing, rhonchi or rales.  Cardiovascular: Regular rate. Regular rhythm. S1, S2. No murmur.  Chest: No chest wall tenderness or crepitus.   Abdomen: Some lower abdominal tenderness with mild guarding. No Murphy's sign. Normoactive Bowel sounds. Soft. No guarding or rebound.  Back: No midline tenderness, no CVA tenderness.   Extremities: Upper and lower extremities with no cyanosis or edema. No calf tenderness. Normal +2 pulses in all extremities.  Skin: Warm and dry. No rash.  Neuro: Alert and appropriate, cranial nerves II-XII grossly intact, normal 5/5 strength in all  extremities, normal sensation in face and extremities, normal finger to nose.    Diagnostic Study Results     Labs -     Results  Procedure Component Value Units Date/Time    UA, Reflex to Microscopic (pts 3 + yrs) [[id number]]  (Abnormal) Collected:  04/30/16 1952    Specimen:  Urine Updated:  04/30/16 2020     Urine Type Clean Catch     Color, UA Amber (A)     Clarity, UA Hazy     Specific Gravity UA >1.035 (A)     Urine pH 5.0     Leukocyte Esterase, UA Large (A)     Nitrite, UA Negative     Protein, UR 30 (A)     Glucose, UA Negative     Ketones UA Trace (A)     Urobilinogen, UA 2.0 mg/dL      Bilirubin, UA Negative     Blood, UA Small (A)     RBC, UA 11 - 25 (A) /hpf      WBC, UA 26 - 50 (A) /hpf      Squamous Epithelial Cells, Urine 0 - 5 /hpf     Urine culture [[id number]] Collected:  04/30/16 1952    Specimen:  Urine from Urine, Clean Catch Updated:  04/30/16 1952    Troponin I [[id number]] Collected:  04/30/16 1823    Specimen:  Blood Updated:  04/30/16 1854     Troponin I 0.01 ng/mL     Comprehensive metabolic panel [[id number]]  (Abnormal) Collected:  04/30/16 1823    Specimen:  Blood Updated:  04/30/16 1848     Glucose 116 (H) mg/dL      BUN 62.9 (H) mg/dL      Creatinine 1.3 (H) mg/dL      Sodium 528 (L) mEq/L      Potassium 4.4 mEq/L      Chloride 104 mEq/L      CO2 18 (L) mEq/L      Calcium 9.9 mg/dL      Protein, Total 7.3 g/dL      Albumin 4.2 g/dL      AST (SGOT) 19 U/L      ALT 11 U/L      Alkaline Phosphatase 85 U/L      Bilirubin, Total 0.6 mg/dL      Globulin 3.1 g/dL      Albumin/Globulin Ratio 1.4     Anion Gap 13.0    Lipase [[id number]] Collected:  04/30/16 1823    Specimen:  Blood Updated:  04/30/16 1848     Lipase 36 U/L     Hemolysis index [[id number]] Collected:  04/30/16 1823     Updated:  04/30/16 1848     Hemolysis Index 9    GFR [[id number]] Collected:  04/30/16 1823     Updated:  04/30/16 1848     EGFR 39.4    Prothrombin time/INR [[id number]] Collected:  04/30/16 1823    Specimen:   Blood Updated:  04/30/16 1838     PT 13.3 sec      PT INR 1.0     PT Anticoag. Given Within 48 hrs. None    CBC with differential [[id number]]  (Abnormal) Collected:  04/30/16 1823    Specimen:  Blood from Blood Updated:  04/30/16 1830     WBC 13.23 (H) x10 3/uL      Hgb 13.1 g/dL      Hematocrit 60.6 %      Platelets 348 x10 3/uL      RBC 4.29 x10 6/uL      MCV 90.7 fL      MCH 30.5 pg  MCHC 33.7 g/dL      RDW 13 %      MPV 8.8 (L) fL      Neutrophils 78.6 %      Lymphocytes Automated 12.8 %      Monocytes 6.9 %      Eosinophils Automated 0.7 %      Basophils Automated 0.6 %      Immature Granulocyte 0.4 %      Nucleated RBC 0.0 /100 WBC      Neutrophils Absolute 10.40 (H) x10 3/uL      Abs Lymph Automated 1.70 x10 3/uL      Abs Mono Automated 0.91 x10 3/uL      Abs Eos Automated 0.09 x10 3/uL      Absolute Baso Automated 0.08 x10 3/uL      Absolute Immature Granulocyte 0.05 x10 3/uL      Absolute NRBC 0.00 x10 3/uL           Radiologic Studies -   Radiology Results (24 Hour)     Procedure Component Value Units Date/Time    CT Abdomen Pelvis W IV/ WO PO Cont [[id number]] Collected:  04/30/16 1951    Order Status:  Completed Updated:  04/30/16 2017    Narrative:       CT ABDOMEN AND PELVIS WITH CONTRAST    CLINICAL STATEMENT: ABDOMINAL PAIN history of stage IV cancer.    COMPARISON: No prior studies are available for comparison.    TECHNIQUE: Helical axial imaging from above the domes of diaphragm  through the pubic symphysis following administration of 100 cc of  Visipaque 320 intravenously and no intraluminal bowel contrast. Sagittal  and coronal reconstructions were obtained.     Note that CT scanning at this site utilizes multiple dose reduction  techniques including automatic exposure control, adjustment of the MAS  and/or KVP according to patient size, and use of iterative  reconstruction technique.    FINDINGS:     LOWER CHEST: There are dependent changes at the lung bases.    ABDOMEN AND PELVIS:      Hepatobiliary: There are surgical clips just lateral to the liver tip..  The liver is grossly unremarkable in appearance except for a few  scattered granulomas.. There is no significant intrahepatic biliary  ductal dilatation.     Gallbladder: Unremarkable.    Spleen: There are multiple splenic granulomas.    Pancreas: Unremarkable.     Adrenal glands: There is mild prominence of the left adrenal gland. The  right is unremarkable.    Kidneys: The right kidney is surgically absent. Left kidney is  unremarkable.    Lymph Nodes: No pathologically enlarged abdominal or pelvic adenopathy.    Hollow viscus: There is questionable mild thickening of the descending  colon and proximal sigmoid. No free fluid, free air or abscess. Appendix  is normal. No bowel obstruction.    Aorta: No aortic aneurysm. Moderate arthrosclerotic change.    Miscellaneous pelvis: Uterus is surgically absent. Urinary bladder is  unremarkable..    Musculoskeletal: There is degenerative change of the spine. Sclerotic  area at L4 vertebral body anteriorly is noted. There is vertebral body  height loss at L5 and vacuum disc at L3-L4 L4-L5 and L5-S1. There is  curvature of the spine convex to the left.      Impression:            1. Questionable thickening of the descending colon and proximal sigmoid.  Recommend clinical correlation for colitis.  No free fluid, free air or  abscess.  2. Right nephrectomy changes. Left kidney is unremarkable.  3. Sclerotic change at L4 may represent a bone island. Metastasis is  probably unlikely. Recommend clinical correlation.    [place]  Teferra, MD   04/30/2016 8:13 PM        .    Medical Decision Making   I am the first provider for this patient.    I reviewed the vital signs, available nursing notes, past medical history, past surgical history, family history and social history.    Vital Signs-Reviewed the patient's vital signs.     Patient Vitals for the past 12 hrs:   BP Temp Pulse Resp   04/30/16 2058 135/61  98 F (36.7 C) 68 18   04/30/16 1947 (!) 115/92 97.6 F (36.4 C) 68 17   04/30/16 1835 133/63 - 66 14   04/30/16 1829 (!) 138/103 - 69 18   04/30/16 1810 148/67 97.9 F (36.6 C) 65 16       Old records: Prior cardiac work up:  There is no medical history, including record of echocardiogram, stress test, or CTA, available in the [facility] system. Pt reports cardiac stress test approximately 10 years ago.    Adult Chest Pain:  12-lead EKG was preformed in the ED. Aspirin was not given as received 325 mg in ambulance PTA.    EKG:  Interpreted by the Emergency Physician.   Time Interpreted: 1805   Rate: 71   Rhythm: Normal Sinus Rhythm    Interpretation: L axis deviation, LBBB, no STEMI.    Comparison: No prior study is available for comparison.    Pulse Oximetry Interpretation: Normal 99% on RA    ED Course:     5:55 PM - Spoke with EMS regarding incoming possible STEMI. They report that NTG was given. EMS states that pt does not have CP. Pt has severe abdominal pain with nausea. They gave an ETA of 4 minutes.      6:02 PM - Pt arrived with EMS.    Heart Score    Flowsheet Row Value   History  0   EKG  1   Risk Factors  1   Total (with age)  4   Onset of pain (time of START of last episode of chest pain)?  0-3 hrs ago   Timing of repeat Troponin/EKG order  HEART Score exceeds limit for Chest Pain Pathway       6:09 PM - Pt has active reproducible abdominal pain and nausea and denies CP repeatedly. Given negative CP and presence of LBBB, do not think this is an acute STEMI.    6:58 PM - Pt's nausea has improved, but she is still very tender on abdominal exam. Pt agrees with CT.    8:30 PM- Discussed the results of the CT scan with patient, pain in worsening to 5/10 and would like pain medicine. Discussed that will start on antibiotics. Pt also concerned because almost passed out and explained difficult to assess cardiac as this LBBB- patient is agreeable to admission for further management, antibiotics. Pt has no local  doctor and is planning to stay up here for about a week. Will page [facility].     8:47 PM- Discussed with Dr. Janine Limbo, [facility] who agrees to admission to remote tele.     9:07 PM- Signed out to Dr. Laurier Nancy pending bed placement.     Provider Notes/Medical Decision Making: Pt with sudden feeling  of going to pass out with abdominal pain followed by diarrhea, no chest pain, arm pain, neck pain, diaphoresis or CAD hx with LBBB- do not think STEMI as no chest pain but unsure if this is old or new. Reproducible pain in abdomen on exam so suspect this is reason for the pain. Pt found to  Have colitis with elevated WBC so will cover with antibiotics and continue IVFs. Pt also with signs of cystitis on UA. Will admit to medicine.       Doctor's Notes     Throughout the stay in the Emergency Department, questions and concerns surrounding pain control, care plans, diagnostic studies, effects of medications administered or prescribed, and future prognostic dilemmas were assessed and addressed.    ROS addendum: The patient and/or family was asked if they had any other complaints or concerns that we could address today and nothing of significance was noted.     _______________________________  Medical DeMedical Decision Makingcision Stacy Gardner  Attestations:    This note is prepared by Adela Glimpse, acting as Scribe for Gweneth Dimitri, DO.     Gweneth Dimitri, DO: The scribe's documentation has been prepared under my direction and personally reviewed by me in its entirety.  I confirm that the note above accurately reflects all work, treatment, procedures, and medical decision making performed by me.     Madaline Brilliant, DO  04/30/16 2126

## 2016-04-30 NOTE — Discharge Instructions (Signed)
Diarrhea     You have been diagnosed with diarrhea.     You have diarrhea when you have stools (bowel movements) that are soft or liquid, or when you have too many stools in a day. You may also have stomach cramps/ pains, nausea (feeling sick to your stomach), vomiting and fever (temperature higher than 100.4ºF / 38ºC).     Diarrhea can be caused by bacteria, viruses and parasites. People sometimes get “traveler s diarrhea.” They get it when they go to other countries. It is caused by bacteria.     People with diarrhea often lose a lot of body fluids. This causes dehydration. Drink a lot of water or other fluids to stay hydrated. You may also be given medicine for your diarrhea. It will slow the diarrhea and stop the vomiting (if you have this symptom).     You should drink lots of natural juices or a sports-type drink that has electrolytes (sodium, potassium, etc). Do not drink a lot of plain water. Sugary drinks like apple and pear juice might make the diarrhea worse.     You might be given medicine to help you with your diarrhea, nausea (feeling sick), vomiting and stomach cramps. This will depend on your age, medical history and symptoms.     It is OK for you to go home today.     Good hygiene (keeping clean) helps to keep the problem from spreading. Please wash your hands often, using soap and water, especially after using the bathroom. Do not prepare food or share food, drinks or utensils (forks, knives, etc.) with other people.     It is very important that you follow up with your regular doctor or a specialist. The doctor will tell you how soon this follow-up needs to be.     YOU SHOULD SEEK MEDICAL ATTENTION IMMEDIATELY, EITHER HERE OR AT THE NEAREST EMERGENCY DEPARTMENT, IF ANY OF THE FOLLOWING OCCURS:  · You are vomiting and cannot keep down fluids.  · There is blood, pus or mucous in your stool.  · You have symptoms of dehydration. These include dry mouth, not urinating (peeing) at least once every eight  hours, feeling dizzy/lightheaded, severe weakness or passing out.           Nausea     You have been seen for nausea.     Nausea is the feeling that you are going to vomit (throw up). Nausea is not a disease. It is a symptom of another problem. For example, nausea, vomiting and diarrhea are symptoms of a stomach virus (the "stomach flu").     You may or may not vomit when you have nausea.      The nausea itself is not dangerous but it can be very uncomfortable.     We might not be able to find out today what is causing your nausea. It is VERY IMPORTANT to see your doctor who can watch for any serious problems.     There are many treatments for nausea. The medical staff will discuss these with you. Some common medicines used to help with nausea are  · Promethazine (Phenergan®), prochlorperazine (Compazine®), metoclopramide (Reglan®), ondansetron (Zofran®), and many others.     Follow a clear liquid diet. Drink water, broth, 7-Up, Sprite, or other clear caffeine-free soft drinks or sports drinks until you feel better. This might help the nausea and keep you from vomiting.      If your nausea lasts for longer than a few days, or if you have new symptoms, we STRONGLY RECOMMEND that you go to see your family doctor, specialist   or clinic. If you cannot get an appointment or do not have a doctor you can always return here or go to the nearest emergency department to be seen again.     YOU SHOULD SEEK MEDICAL ATTENTION IMMEDIATELY, EITHER HERE OR AT THE NEAREST EMERGENCY DEPARTMENT, IF ANY OF THE FOLLOWING OCCURS:  · You vomit often.  · You have abdominal (belly) pain.  · You vomit blood or anything that looks like coffee grounds.  · You have a headache.  · You have any new symptoms or concerns.  · You feel more “unwell.”

## 2016-04-30 NOTE — ED Notes (Signed)
Bed: BL15  Expected date: 04/30/16  Expected time: 5:55 PM  Means of arrival: [id number]  Comments:  [facility]

## 2016-04-30 NOTE — ED Notes (Signed)
Bed: PU31  Expected date:   Expected time:   Means of arrival:   Comments:

## 2016-04-30 NOTE — ED Notes (Signed)
Bed: PU32  Expected date:   Expected time:   Means of arrival:   Comments:

## 2016-04-30 NOTE — ED Triage Notes (Signed)
Pt has a cc of abd pain and near syncope that began around 1600 today.   Blood pressure 148/67, pulse 65, temperature 97.9 F (36.6 C), resp. rate 16, height 5\' 4"  (1.626 m), weight 63 kg, SpO2 99 %.

## 2016-05-01 DIAGNOSIS — N3000 Acute cystitis without hematuria: Secondary | ICD-10-CM | POA: Insufficient documentation

## 2016-05-01 DIAGNOSIS — R42 Dizziness and giddiness: Secondary | ICD-10-CM | POA: Insufficient documentation

## 2016-05-01 DIAGNOSIS — I10 Essential (primary) hypertension: Secondary | ICD-10-CM

## 2016-05-01 LAB — CBC WITH MANUAL DIFFERENTIAL
Absolute NRBC: 0 10*3/uL
Band Neutrophils Absolute: 0 10*3/uL (ref 0.00–1.00)
Band Neutrophils: 0 %
Basophils Absolute Manual: 0 10*3/uL (ref 0.00–0.20)
Basophils Manual: 0 %
Cell Morphology: NORMAL
Eosinophils Absolute Manual: 0 10*3/uL (ref 0.00–0.70)
Eosinophils Manual: 0 %
Hematocrit: 34.4 % — ABNORMAL LOW (ref 37.0–47.0)
Hgb: 11.8 g/dL — ABNORMAL LOW (ref 12.0–16.0)
Lymphocytes Absolute Manual: 1.28 10*3/uL (ref 0.50–4.40)
Lymphocytes Manual: 9 %
MCH: 31 pg (ref 28.0–32.0)
MCHC: 34.3 g/dL (ref 32.0–36.0)
MCV: 90.3 fL (ref 80.0–100.0)
MPV: 8.7 fL — ABNORMAL LOW (ref 9.4–12.3)
Monocytes Absolute: 0.85 10*3/uL (ref 0.00–1.20)
Monocytes Manual: 6 %
Neutrophils Absolute Manual: 12.07 10*3/uL — ABNORMAL HIGH (ref 1.80–8.10)
Nucleated RBC: 0 /100 WBC (ref 0.0–1.0)
Platelet Estimate: NORMAL
Platelets: 269 10*3/uL (ref 140–400)
RBC: 3.81 10*6/uL — ABNORMAL LOW (ref 4.20–5.40)
RDW: 13 % (ref 12–15)
Segmented Neutrophils: 85 %
WBC: 14.2 10*3/uL — ABNORMAL HIGH (ref 3.50–10.80)

## 2016-05-01 LAB — HEMOLYSIS INDEX: Hemolysis Index: 3 (ref 0–18)

## 2016-05-01 LAB — ECG 12-LEAD
Atrial Rate: 71 {beats}/min
P Axis: 71 degrees
P-R Interval: 186 ms
Q-T Interval: 426 ms
QRS Duration: 132 ms
QTC Calculation (Bezet): 462 ms
R Axis: -51 degrees
T Axis: 118 degrees
Ventricular Rate: 71 {beats}/min

## 2016-05-01 LAB — MAGNESIUM: Magnesium: 2.2 mg/dL (ref 1.6–2.6)

## 2016-05-01 LAB — TROPONIN I: Troponin I: 0.01 ng/mL (ref 0.00–0.09)

## 2016-05-01 MED ORDER — RISAQUAD PO CAPS
1.0000 | ORAL_CAPSULE | Freq: Every day | ORAL | Status: DC
Start: 2016-05-01 — End: 2016-05-02
  Administered 2016-05-01 – 2016-05-02 (×2): 1 via ORAL
  Filled 2016-05-01 (×2): qty 1

## 2016-05-01 MED ORDER — TRAMADOL HCL 50 MG PO TABS
ORAL_TABLET | Freq: Four times a day (QID) | ORAL | Status: DC | PRN
Start: 2016-05-01 — End: 2016-05-02
  Filled 2016-05-01 (×2): qty 1

## 2016-05-01 MED ORDER — DEXTROSE-SODIUM CHLORIDE 5-0.9 % IV SOLN
INTRAVENOUS | Status: DC
Start: 2016-05-01 — End: 2016-05-02

## 2016-05-01 MED ORDER — NALOXONE HCL 0.4 MG/ML IJ SOLN (WRAP)
0.2000 mg | INTRAMUSCULAR | Status: DC | PRN
Start: 2016-05-01 — End: 2016-05-02

## 2016-05-01 MED ORDER — SODIUM CHLORIDE 0.9 % IV SOLN
500.0000 mg | Freq: Every day | INTRAVENOUS | Status: DC
Start: 2016-05-01 — End: 2016-05-02
  Administered 2016-05-01 – 2016-05-02 (×2): 500 mg via INTRAVENOUS
  Filled 2016-05-01 (×2): qty 0.5

## 2016-05-01 MED ORDER — HEPARIN SODIUM (PORCINE) 5000 UNIT/ML IJ SOLN
5000.0000 [IU] | Freq: Three times a day (TID) | INTRAMUSCULAR | Status: DC
Start: 2016-05-01 — End: 2016-05-02
  Administered 2016-05-01 – 2016-05-02 (×3): 5000 [IU] via SUBCUTANEOUS
  Filled 2016-05-01 (×3): qty 1

## 2016-05-01 MED ORDER — SULFAMETHOXAZOLE-TRIMETHOPRIM 800-160 MG PO TABS
1.0000 | ORAL_TABLET | Freq: Two times a day (BID) | ORAL | Status: DC
Start: 2016-05-01 — End: 2016-05-01

## 2016-05-01 MED ORDER — ZOLPIDEM TARTRATE 5 MG PO TABS
5.0000 mg | ORAL_TABLET | Freq: Every evening | ORAL | Status: DC | PRN
Start: 2016-05-01 — End: 2016-05-02
  Administered 2016-05-01 (×2): 5 mg via ORAL
  Filled 2016-05-01: qty 1

## 2016-05-01 MED ORDER — LEVOFLOXACIN IN D5W 250 MG/50ML IV SOLN
250.0000 mg | Freq: Every day | INTRAVENOUS | Status: DC
Start: 2016-05-01 — End: 2016-05-01

## 2016-05-01 MED ORDER — LISINOPRIL 20 MG PO TABS
20.0000 mg | ORAL_TABLET | Freq: Every day | ORAL | Status: DC
Start: 2016-05-01 — End: 2016-05-01

## 2016-05-01 MED ORDER — ZOLPIDEM TARTRATE 5 MG PO TABS
ORAL_TABLET | ORAL | Status: AC
Start: 2016-05-01 — End: 2016-05-01
  Filled 2016-05-01: qty 1

## 2016-05-01 MED ORDER — AMLODIPINE BESYLATE 5 MG PO TABS
5.0000 mg | ORAL_TABLET | Freq: Every day | ORAL | Status: DC
Start: 2016-05-01 — End: 2016-05-02
  Administered 2016-05-01 – 2016-05-02 (×2): 5 mg via ORAL
  Filled 2016-05-01 (×2): qty 1

## 2016-05-01 MED ORDER — PANTOPRAZOLE SODIUM 40 MG IV SOLR
40.0000 mg | Freq: Every day | INTRAVENOUS | Status: DC
Start: 2016-05-01 — End: 2016-05-02
  Administered 2016-05-01 – 2016-05-02 (×2): 40 mg via INTRAVENOUS
  Filled 2016-05-01 (×2): qty 40

## 2016-05-01 MED ORDER — OXYBUTYNIN CHLORIDE 5 MG PO TABS
5.0000 mg | ORAL_TABLET | Freq: Three times a day (TID) | ORAL | Status: DC | PRN
Start: 2016-05-01 — End: 2016-05-02
  Filled 2016-05-01 (×3): qty 1

## 2016-05-01 MED ORDER — HYDRALAZINE HCL 25 MG PO TABS
25.0000 mg | ORAL_TABLET | Freq: Three times a day (TID) | ORAL | Status: DC | PRN
Start: 2016-05-01 — End: 2016-05-02

## 2016-05-01 NOTE — Progress Notes (Addendum)
[facility]  PROGRESS NOTE      Patient: Gina Cooper  Date: 05/01/2016   LOS: 1 Days  Admission Date: 04/30/2016   MRN: [id number]  Attending: Rolan Bucco    7 AM to 7 PM: Please contact me on the following pager [phone]  After 7 PM: Contact the night hospitalist         ASSESSMENT/PLAN     Gina Cooper is a 82 y.o. 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 History of diarrhea x 2.       Plan for the day    Extensive conversation with the patient regarding her like UTI and the setting of IV contrast. Will hydrate. Nephrology to see patient given she is in from out of town, consideration of acetylcysteine given IV contrast, and will likely need follow up following her stay here.     Currently on ceftazidime. Creatinine 1.3    Pain could be from cystitis vs possible colitis. The former is more likely.     Start [place] (patient preference for insomnia)    As per below      Active Problems:    Colitis    Acute cystitis    Dizziness    Essential hypertension      AKI      Impression:  Expect to rise within 24-48 hours of exposures, peak within 3-5 days and resolves by 7-10 days. UTI    Plan  -Follow serum creatinine daily and urine output with strict I/Os  -Urinanalysis positive, urine culture if UA is positive. Consider renal ultrasound  -Avoid nephrotoxic medications.   -Hold ACE-I/ARB, NSAIDS, diuretics  - Consider N-acetylcysteine 1200 mg PO BID on day prior to and day of contrast.   - Appreciate ID recs regarding a renal antibitoic.       Acute cystitis  Oxybutinin    Hypertension  Plan  - Held lisinopril for now  - Continue antihypertensive meds             Analgesia: oxybutinin and po    Nutrition: regular    DVT Prophylaxis:heparin subq       Code Status: Full Code     DISPO: home    Family Contact: none       SUBJECTIVE     Gina Cooper states that she would like to go home as soon as possible.      MEDICATIONS      Current Facility-Administered Medications   Medication Dose Route Frequency   . amLODIPine  5 mg Oral Daily   . heparin (porcine)  5,000 Units Subcutaneous Q8H [facility]   . lisinopril  20 mg Oral Daily   . pantoprazole  40 mg Intravenous Daily   . sulfamethoxazole-trimethoprim  1 tablet Oral Q12H [facility]   . zolpidem           PHYSICAL EXAM     Vitals:    05/01/16 0900   BP:    Pulse: 69   Resp: 16   Temp:    SpO2: 92%       Temperature: Temp  Min: 97.6 F (36.4 C)  Max: 99.6 F (37.6 C)  Pulse: Pulse  Min: 64  Max: 70  Respiratory: Resp  Min: 14  Max: 20  Non-Invasive BP: BP  Min: 113/56  Max: 148/67  Pulse Oximetry SpO2  Min: 92 %  Max: 99 %  Intake and Output Summary (Last 24 hours) at Date Time    Intake/Output Summary (Last 24 hours) at 05/01/16 1006  Last data filed at 05/01/16 0946   Gross per 24 hour   Intake                1 ml   Output                0 ml   Net                1 ml         GEN APPEARANCE: Normal;  A&OX3  HEENT: PERLA; EOMI; Conjunctiva Clear  NECK: Supple; No bruits  CVS: RRR, S1, S2; No M/G/R  LUNGS: CTAB; No Wheezes; No Rhonchi: No rales  ABD: Soft; No TTP; + Normoactive BS  EXT: No edema; Pulses 2+ and intact  Skin exam:  pink  NEURO: CN 2-12 intact; No Focal neurological deficits  CAP REFILL:  Normal  MENTAL STATUS:  Normal        LABS       Recent Labs  Lab 05/01/16  0301 04/30/16  1823   WBC 14.20* 13.23*   RBC 3.81* 4.29   Hgb 11.8* 13.1   Hematocrit 34.4* 38.9   MCV 90.3 90.7   Platelets 269 348         Recent Labs  Lab 05/01/16  0301 04/30/16  1823   Sodium  --  135*   Potassium  --  4.4   Chloride  --  104   CO2  --  18*   BUN  --  21.0*   Creatinine  --  1.3*   Glucose  --  116*   Calcium  --  9.9   Magnesium 2.2  --          Recent Labs  Lab 04/30/16  1823   ALT 11   AST (SGOT) 19   Bilirubin, Total 0.6   Albumin 4.2   Alkaline Phosphatase 85         Recent Labs  Lab 05/01/16  0301 04/30/16  1823   Troponin I 0.01 0.01         Recent Labs  Lab 04/30/16  1823   PT INR 1.0   PT  13.3          Microbiology Results     Procedure Component Value Units Date/Time    Urine culture [[id number]] Collected:  04/30/16 1952    Specimen:  Urine from Urine, Clean Catch Updated:  05/01/16 0251           RADIOLOGY     Upon my review:   Ct Abdomen Pelvis W Iv/ Wo Po Cont    Result Date: 04/30/2016   1. Questionable thickening of the descending colon and proximal sigmoid. Recommend clinical correlation for colitis. No free fluid, free air or abscess. 2. Right nephrectomy changes. Left kidney is unremarkable. 3. Sclerotic change at L4 may represent a bone island. Metastasis is probably unlikely. Recommend clinical correlation. [place]  Teferra, MD 04/30/2016 8:13 PM         Signed,  Durene Romans Ramin Zoll  10:06 AM 05/01/2016

## 2016-05-01 NOTE — Plan of Care (Signed)
Problem: Bladder/Voiding  Goal: Remains continent  Outcome: Progressing

## 2016-05-01 NOTE — UM Notes (Signed)
04/30/2016 @ 2051  Admit to Inpatient (Order [id number])   Diagnosis Colitis    Estimated Length of Stay > or = to 2 midnights    Tentative Discharge Plan? Home or Self Care    Patient Class Inpatient      Medicare HMO: U[employer]      82 y.o. female with a PMHx of  Renal cell carcinoma sp uni lat total nephrectomy.who presented with c/o dizziness , weakness and near passing out. Patient also had an episode of diarrhea, associated with severe lower abdominal cramping, associated with nausea.    PMH: kidney CA, HTN      VS: 97.9, 65, 16, 148/67, 99%    WBC 14.20  Glucose 116  BUN 21.0  Creat 1.3  Na 135  CO2 18      Urine: amber, >1.035 specific gravity, large LE, 30 protein, trace ketones, small blood, 11-25 RBC, 26-50 WBC    CT AB:  1. Questionable thickening of the descending colon and proximal sigmoid.  Recommend clinical correlation for colitis. No free fluid, free air or  abscess.  2. Right nephrectomy changes. Left kidney is unremarkable.  3. Sclerotic change at L4 may represent a bone island. Metastasis is  probably unlikely.    ED medications: iv zofran, iv ns, iv morphine, iv flagyl, iv mrophien, bactrim, norvasc, lisionopil, iv protonix, sq heparin, ambien, iv dextrose 5% and 0.9% NaCl, iv levaquin, risaquad, iv fortaz    PLAN: Pain control, metronidazole and bactrim ABX.Marland Kitchen  Urine culture and continue bactrim.  Continue hydration.  Continue amlodipine and benazepril.  NPO  Heparin, SCDs for DVT prophylxis    Scheduled Meds:  Current Facility-Administered Medications   Medication Dose Route Frequency   . amLODIPine  5 mg Oral Daily   . cefTAZidime  500 mg Intravenous Daily   . heparin (porcine)  5,000 Units Subcutaneous Q8H [facility]   . lactobacillus/streptococcus  1 capsule Oral Daily   . pantoprazole  40 mg Intravenous Daily     Continuous Infusions:  . dextrose 5 % and 0.9% NaCl 100 mL/hr at 05/01/16 1202     PRN Meds:.hydrALAZINE, naloxone, oxybutynin, Ultram + Acetaminophen 50-325 combo dose,  zolpidem      Elfredia Nevins, RN, BSN  Utilization Review Case Manager  Case Management  [facility]   [address]  [place], [place]  [address]  [phone]  Judie Petit [phone] F [phone]

## 2016-05-01 NOTE — Plan of Care (Signed)
Problem: Safety  Goal: Patient will be free from injury during hospitalization  Outcome: Progressing

## 2016-05-01 NOTE — Consults (Signed)
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 Thank you for allowing [place] to participate in the care of this patient.    Eddie North, MD  Office - [phone]      History:   Gina Cooper is a 82 y.o. female who presents to the hospital on 04/30/2016 with hx of RCC s/o unilateral nephrectomy presented with sudden onset of diarrhea,abdominal cramping ,dizziness with near syncopal event.Found to have mild renal insufficiency along with pyuria.had a CT scan of abd and pelvis with IV contrast on admission.Feels better now with no further diarrhea.    Past Medical and Surgical  History:     Past Medical History:   Diagnosis Date   . Cancer of kidney    . Hypertension    . Lumbar stress fracture    . Osteoporosis      Past Surgical History:   Procedure Laterality Date   . ABDOMINAL SURGERY     . INSERTION, VENA CAVA FILTER       Family History:   History reviewed. No pertinent family history.  Social History:     Social History     Social History   . Marital status: Married     Spouse name: N/A   . Number of children: N/A   . Years of education: N/A     Occupational History   . Not on file.     Social History Main Topics   . Smoking status: Former Smoker     Types: Cigarettes   . Smokeless tobacco: Never Used   . Alcohol use 1.2 oz/week     2 Standard drinks or equivalent per week   . Drug use: No    . Sexual activity: Not on file     Other Topics Concern   . Not on file     Social History Narrative   . No narrative on file     Allergies:   No Known Allergies  Medications:     Current Facility-Administered Medications   Medication Dose Route Frequency   . amLODIPine  5 mg Oral Daily   . cefTAZidime  500 mg Intravenous Daily   . heparin (porcine)  5,000 Units Subcutaneous Q8H [facility]   . lactobacillus/streptococcus  1 capsule Oral Daily   . pantoprazole  40 mg Intravenous Daily     Review of Systems:   12 systems were reviewed and were negative except for the following:diarhea,abdominal pain  Physical Exam:   Temp:  [97.6 F (36.4 C)-99.6 F (37.6 C)] 98.7 F (37.1 C)  Heart Rate:  [64-75] 74  Resp Rate:  [14-20] 18  BP: (113-148)/(56-103) 120/58    Intake and Output Summary (Last 24 hours) at Date Time    Intake/Output Summary (Last 24 hours) at 05/01/16 1715  Last data filed at 05/01/16 1202   Gross per 24 hour   Intake             1301 ml   Output                0 ml   Net             1301 ml       Gen: WN WD NAD  HEENT: NC/AT, moist MM, clear oropharynx  Neck: No JVD  CV: S1 S2 N RRR no M/R/GC  Chest: Good effort, CTAB  Ab: ND NT Soft no HSM +BS  Skin: Dry  Ext: No C/E  Psych: Appropriate mood and affect    Labs:     Recent Labs  Lab 05/01/16  0301 04/30/16  1823   Glucose  --  116*   BUN  --  21.0*   Creatinine  --  1.3*   Calcium  --  9.9   Sodium  --  135*   Potassium  --  4.4   Chloride  --  104   CO2  --  18*   Albumin  --  4.2   Magnesium 2.2  --        Recent Labs  Lab 05/01/16  0301 04/30/16  1823   WBC 14.20* 13.23*   RBC 3.81* 4.29   Hgb 11.8* 13.1   Hematocrit 34.4* 38.9   MCV 90.3 90.7   MCH 31.0 30.5   MCHC 34.3 33.7   RDW 13 13   MPV 8.7* 8.8*   Platelets 269 348     Radiology:   Radiological Procedure reviewed.   EKG:     Prior Records:   I reviewed his old records.

## 2016-05-01 NOTE — H&P (Signed)
[facility]      Patient: Gina Cooper  Date: 04/30/2016   DOB: February 28, 1934  Admission Date: 04/30/2016   MRN: [id number]  Attending: Adella Nissen         Chief Complaint   Patient presents with   . Abdominal Pain   . Near syncope      History Gathered From:Patient.    HISTORY AND PHYSICAL     Gina Cooper is a 82 y.o. female with a PMHx of  Renal cell carcinoma sp uni lat total nephrectomy.who presented with c/o dizziness , weakness and near passing out.Gina Cooper later had an episode of diarrhea, associated with sever lower abdominal cramping.also felt nauseated,   Patient denies any fever rigors or chills.  Patient is tearful and anxious,she is from [place] has been visiting her daughter  , who has TKA today to help her.  Denies any contact with a sick person or any dietary indiscretion.  H/o vague h/o dysuria but denies any hemat urea.    Past Medical History:   Diagnosis Date   . Cancer of kidney    . Hypertension    . Lumbar stress fracture    . Osteoporosis        Past Surgical History:   Procedure Laterality Date   . ABDOMINAL SURGERY     . INSERTION, VENA CAVA FILTER         Prior to Admission medications    Medication Sig Start Date End Date Taking? Authorizing Provider   amLODIPine (NORVASC) 5 MG tablet Take 5 mg by mouth daily.   Yes [provider]   benazepril (LOTENSIN) 20 MG tablet Take 20 mg by mouth daily.   Yes [provider]       No Known Allergies    CODE STATUS: full code.    PRIMARY CARE MD: Christa See, MD    History reviewed. No pertinent family history.    Social History   Substance Use Topics   . Smoking status: Former Smoker     Types: Cigarettes   . Smokeless tobacco: Never Used   . Alcohol use 1.2 oz/week     2 Standard drinks or equivalent per week       REVIEW OF SYSTEMS   Positive for: dizziness, Light headedness,dizziness and feelings of passing out, N/V and diarrhea, lower abdominal pain and dysuria.  Negative for: cough, hemoptysis or  hematemesis or ma lena.  All ROS completed and otherwise negative.    PHYSICAL EXAM     Vital Signs (most recent): BP 123/58   Pulse 64   Temp 97.8 F (36.6 C) (Oral)   Resp 18   Ht 1.626 m (5\' 4" )   Wt 63 kg (139 lb)   SpO2 97%   BMI 23.86 kg/m   Constitutional: Appearance:healthy looking  female. Patient speaks freely in full sentences.   HEENT: NC/AT, PERRL, no scleral icterus or conjunctival pallor, no nasal discharge, MMM, oropharynx without erythema or exudate  Neck: trachea midline, supple, no cervical or supraclavicular lymphadenopathy or masses  Cardiovascular: RRR, normal S1 S2, no murmurs, gallops, palpable thrills, no JVD, Non-displaced PMI.  Respiratory: Normal rate. No retractions or increased work of breathing. Clear to auscultation and percussion bilaterally.  Gastrointestinal: +BS, non-distended, tender lower abdomen on deep palpation, no visceromegaly.  Genitourinary: no suprapubic or costovertebral angle tenderness  Musculoskeletal: ROM and motor strength grossly normal. No clubbing, edema, or cyanosis. DP and radial pulses 2+ and symmetric.  Skin  exam:  Warm dry and intact.  Neurologic: EOMI, CN 2-12 grossly intact. no gross motor or sensory deficits  Psychiatric: AAOx3, affect and mood appropriate. The patient is alert, interactive, appropriate.  Capillary refill:  Normal capillary refill.    Exam done by Adella Nissen, MD on 04/30/16 at 11:00 PM.      LABS & IMAGING     Recent Results (from the past 24 hour(s))   CBC with differential    Collection Time: 04/30/16  6:23 PM   Result Value Ref Range    WBC 13.23 (H) 3.50 - 10.80 x10 3/uL    Hgb 13.1 12.0 - 16.0 g/dL    Hematocrit 16.1 09.6 - 47.0 %    Platelets 348 140 - 400 x10 3/uL    RBC 4.29 4.20 - 5.40 x10 6/uL    MCV 90.7 80.0 - 100.0 fL    MCH 30.5 28.0 - 32.0 pg    MCHC 33.7 32.0 - 36.0 g/dL    RDW 13 12 - 15 %    MPV 8.8 (L) 9.4 - 12.3 fL    Neutrophils 78.6 None %    Lymphocytes Automated 12.8 None %    Monocytes 6.9 None %     Eosinophils Automated 0.7 None %    Basophils Automated 0.6 None %    Immature Granulocyte 0.4 None %    Nucleated RBC 0.0 0.0 - 1.0 /100 WBC    Neutrophils Absolute 10.40 (H) 1.80 - 8.10 x10 3/uL    Abs Lymph Automated 1.70 0.50 - 4.40 x10 3/uL    Abs Mono Automated 0.91 0.00 - 1.20 x10 3/uL    Abs Eos Automated 0.09 0.00 - 0.70 x10 3/uL    Absolute Baso Automated 0.08 0.00 - 0.20 x10 3/uL    Absolute Immature Granulocyte 0.05 0 x10 3/uL    Absolute NRBC 0.00 0 x10 3/uL   Comprehensive metabolic panel    Collection Time: 04/30/16  6:23 PM   Result Value Ref Range    Glucose 116 (H) 70 - 100 mg/dL    BUN 04.5 (H) 7.0 - 40.9 mg/dL    Creatinine 1.3 (H) 0.6 - 1.0 mg/dL    Sodium 811 (L) 914 - 145 mEq/L    Potassium 4.4 3.5 - 5.1 mEq/L    Chloride 104 100 - 111 mEq/L    CO2 18 (L) 22 - 29 mEq/L    Calcium 9.9 7.9 - 10.2 mg/dL    Protein, Total 7.3 6.0 - 8.3 g/dL    Albumin 4.2 3.5 - 5.0 g/dL    AST (SGOT) 19 5 - 34 U/L    ALT 11 0 - 55 U/L    Alkaline Phosphatase 85 37 - 106 U/L    Bilirubin, Total 0.6 0.2 - 1.2 mg/dL    Globulin 3.1 2.0 - 3.6 g/dL    Albumin/Globulin Ratio 1.4 0.9 - 2.2    Anion Gap 13.0 5.0 - 15.0   Troponin I    Collection Time: 04/30/16  6:23 PM   Result Value Ref Range    Troponin I 0.01 0.00 - 0.09 ng/mL   Lipase    Collection Time: 04/30/16  6:23 PM   Result Value Ref Range    Lipase 36 8 - 78 U/L   Prothrombin time/INR    Collection Time: 04/30/16  6:23 PM   Result Value Ref Range    PT 13.3 12.6 - 15.0 sec    PT INR 1.0 0.9 - 1.1  PT Anticoag. Given Within 48 hrs. None    Hemolysis index    Collection Time: 04/30/16  6:23 PM   Result Value Ref Range    Hemolysis Index 9 0 - 18   GFR    Collection Time: 04/30/16  6:23 PM   Result Value Ref Range    EGFR 39.4    UA, Reflex to Microscopic (pts 3 + yrs)    Collection Time: 04/30/16  7:52 PM   Result Value Ref Range    Urine Type Clean Catch     Color, UA Amber (A) Clear - Yellow    Clarity, UA Hazy Clear - Hazy    Specific Gravity UA >1.035 (A)  1.001 - 1.035    Urine pH 5.0 5.0 - 8.0    Leukocyte Esterase, UA Large (A) Negative    Nitrite, UA Negative Negative    Protein, UR 30 (A) Negative    Glucose, UA Negative Negative    Ketones UA Trace (A) Negative    Urobilinogen, UA 2.0 0.2 - 2.0 mg/dL    Bilirubin, UA Negative Negative    Blood, UA Small (A) Negative    RBC, UA 11 - 25 (A) 0 - 5 /hpf    WBC, UA 26 - 50 (A) 0 - 5 /hpf    Squamous Epithelial Cells, Urine 0 - 5 0 - 25 /hpf       MICROBIOLOGY:  Blood Culture:not done.  Urine Culture: ordered.  Antibiotics Started: Yes.    IMAGING:  CT Abdomen Pelvis W IV/ WO PO Cont [[id number]] (Order [id number])   Status: Final result   Study Result     CT ABDOMEN AND PELVIS WITH CONTRAST    CLINICAL STATEMENT: ABDOMINAL PAIN history of stage IV cancer.    COMPARISON: No prior studies are available for comparison.    TECHNIQUE: Helical axial imaging from above the domes of diaphragm  through the pubic symphysis following administration of 100 cc of  Visipaque 320 intravenously and no intraluminal bowel contrast. Sagittal  and coronal reconstructions were obtained.     Note that CT scanning at this site utilizes multiple dose reduction  techniques including automatic exposure control, adjustment of the MAS  and/or KVP according to patient size, and use of iterative  reconstruction technique.    FINDINGS:     LOWER CHEST: There are dependent changes at the lung bases.    ABDOMEN AND PELVIS:     Hepatobiliary: There are surgical clips just lateral to the liver tip..  The liver is grossly unremarkable in appearance except for a few  scattered granulomas.. There is no significant intrahepatic biliary  ductal dilatation.     Gallbladder: Unremarkable.    Spleen: There are multiple splenic granulomas.    Pancreas: Unremarkable.     Adrenal glands: There is mild prominence of the left adrenal gland. The  right is unremarkable.    Kidneys: The right kidney is surgically absent. Left kidney  is  unremarkable.    Lymph Nodes: No pathologically enlarged abdominal or pelvic adenopathy.    Hollow viscus: There is questionable mild thickening of the descending  colon and proximal sigmoid. No free fluid, free air or abscess. Appendix  is normal. No bowel obstruction.    Aorta: No aortic aneurysm. Moderate arthrosclerotic change.    Miscellaneous pelvis: Uterus is surgically absent. Urinary bladder is  unremarkable..    Musculoskeletal: There is degenerative change of the spine. Sclerotic  area at L4 vertebral body anteriorly is  noted. There is vertebral body  height loss at L5 and vacuum disc at L3-L4 L4-L5 and L5-S1. There is  curvature of the spine convex to the left.    IMPRESSION:        1. Questionable thickening of the descending colon and proximal sigmoid.  Recommend clinical correlation for colitis. No free fluid, free air or  abscess.  2. Right nephrectomy changes. Left kidney is unremarkable.  3. Sclerotic change at L4 may represent a bone island. Metastasis is  probably unlikely. Recommend clinical correlation.    [place]  Teferra, MD   04/30/2016 8:13 PM           CARDIAC:  EKG Interpretation (upon my review):   Rate: 71              Rhythm: Normal Sinus Rhythm               Interpretation: L axis deviation, LBBB, no STEMI.               Comparison: No prior study is available for comparison.  Markers:    Recent Labs  Lab 04/30/16  1823   Troponin I 0.01       EMERGENCY DEPARTMENT COURSE:  Orders Placed This Encounter   Procedures   . Urine culture   . CT Abdomen Pelvis W IV/ WO PO Cont   . CBC with differential   . Comprehensive metabolic panel   . Troponin I   . Lipase   . Prothrombin time/INR   . UA, Reflex to Microscopic (pts 3 + yrs)   . Hemolysis index   . GFR   . Troponin I   . CBC WITH MANUAL DIFFERENTIAL   . Magnesium   . Prothrombin time/INR   . CBC WITH MANUAL DIFFERENTIAL   . Magnesium   . Hemolysis index   . Diet NPO effective now Except for: SIPS WITH MEDS   . Cardiac  monitoring (ED Only)   . ED Holding Orders Expire in 24 Hours   . Notify Admitting Attending ( Change in Condition)   . Notify Attending of Patient Arrival to Floor within 24 Hours   . Notify Physician (Vital Signs)   . Notify Physician (Lab Results)   . Vital Signs Q4HR   . Notify physician   . I/O   . Height   . Weight   . Skin assessment   . Place sequential compression device   . Maintain sequential compression device   . Education: Activity   . Education: Disease Process & Condition   . Education: Pain Management   . Education: Falls Risk   . Education: Smoking Cessation   . Telemetry 24 Hour Protocol   . Full Code   . ED Unit Sec Comm Order   . ECG 12 lead   . Saline lock IV   . Admit to Inpatient   . Admit to Inpatient   . [facility] ED Bed Request (Inpatient)       ASSESSMENT & PLAN     Larsen Zettel is a 82 y.o. female admitted under my care with     Patient Active Hospital Problem List:  1- Colitis (04/30/2016)    Assessment: Distal colitis, on radiological and clinical evaluation.    Plan: Pain control, metronidazole and bactrim ABX.Marland Kitchen  2- Acute cystitis (05/01/2016)    Assessment: as evidence on UA,    Plan: will order urine culture  And continue bactrim.  3- Dizziness (05/01/2016)  Assessment: most likely 2 to dehydration resulting from diarrhea.    Plan: will continue hydration.  4- Essential hypertension (05/01/2016)    Assessment: managed.    Plan: will continue amlodipine and benazepril.  Patient has solitary kidney , Renal insufficiency, has received IV contrast in ED, will continue hydrating her and consult ID  For ABX  Dosage and selection, will monitor renal function every shift.      Nutrition  NPO except me ds.    DVT/VTE Prophylaxis  Heparin SC, SCD,s    Anticipated medical stability for discharge: 1-2 days.    Service status/Reason for ongoing hospitalization: work up her ailments.  Anticipated Discharge Needs: I/V   Hydration.    Signed,  Adella Nissen    05/01/2016 2:20 AM  Time Elapsed:   70 minutes.

## 2016-05-01 NOTE — Plan of Care (Signed)
Received via stretcher from ED just before 1400. Oriented to room and call bell. Husband with pt. Assessed and telemetry applied.

## 2016-05-01 NOTE — Progress Notes (Signed)
Patient A/o resting without complaints.  Report received from [facility].

## 2016-05-01 NOTE — Progress Notes (Signed)
Report called to RN Rosemary on unit 28.  Patient to be  Transported to unit 28 via cart and transport.      Patient aware of plan of care verbalized understanding.     VSS

## 2016-05-01 NOTE — Consults (Addendum)
Infectious Diseases and Tropical Medicine Consult  Tenna Child, MD          Date Time: 05/01/16 11:55 AM  Patient Name: Gina Cooper  Referring Physician: Rolan Bucco, MD      Reason for Consultation:      Colitis    Assessment:      Likely urinary tract infection   Low likelihood of colitis.   Renal insufficiency.   History of nephrectomy.   Generalized anxiety disorder    Recommendations:      Start ceftazidime and Flagyl.   Await pending urine culture   Probiotics    Monitor electrolytes and renal functions closely   Monitor clinically                                                                   History of Present Illness:     Gina Cooper is an 82 year old female with a history of renal cell carcinoma requiring left-sided nephrectomy is admitted because of chills, generalized weakness and near syncopal episode while at a grocery store.  Patient subsequently developed crampy abdominal pain associated with nausea .  Denies any fevers.  Complains of some dysuria but no hematuria.  CT scan of the abdomen showing possible descending and proximal sigmoid colon colitis.  Urinalysis is consistent with urinary tract infection.  Urine culture is ordered and is pending.  Blood culture is not ordered.  Patient is found to have leukocytosis with left shift and mild renal insufficiency.No chest pain, cough or shortness of breath.  No skin rashes, joint pains or swellings.  Patient is visiting from [place] to see her daughter has had knee arthroscopy today.  Patient is anxious to go to the care of her daughter    Past Medical History:      Renal cell carcinoma   Hypertension   Lumbar stress fracture   Osteoporosis    Past Surgical History:      Nephrectomy   Vena cava filter insertion    Family History:      Noncontributory    Social History:      Former smoker   Alcohol use: yes    Allergies:      No known allergies    Review of Systems:     General:  Comfortable, no acute  distress,no fever, complains of chills.  HEENT: No runny nose or sore throat  Respiratory: no cough or shortness of breath, no hematemesis or hemoptysis  Cardiac: no chest pain  Abdomen: Left upper and back abdominal pain, complains of nausea but no vomiting, complains of diarrhea  Neurologic: awake and alert  Extremities: no joint pains or swelling.  Genitourinary: Complains of mild dysuria but no hematuria  Dermatologic: no skin rashes, no itching    Physical Exam:     Blood pressure 113/56, pulse 75, temperature 99.3 F (37.4 C), temperature source Oral, resp. rate 16, height 1.626 m (5\' 4" ), weight 63 kg (139 lb), SpO2 95 %.    General Appearance: Comfortable, well-appearing and in no acute distress.    HEENT:  Head is normocephalic, atraumatic, pupils are equal and reactive to light  Neck:    Supple,No neck lymphadenopathy, no thyromegaly  Lungs:  Clear to auscultation   Chest  Wall: Symmetric chest wall expansion.   Heart : Regular rate and rhythm, no murmur or gallop  Abdomen: Abdomen is soft,left flank tenderness, good bowel sounds, no hepatosplenomegaly, midline scar  Neurological: Awake and alert, normal muscle strength, no focal deficit  Extremities: No edema, no clubbing or cyanosis  Skin:  Warm and dry.No rash or ecchymosis.   Psychiatric: Anxious    Labs:     Recent Labs      05/01/16   0301  04/30/16   1823   WBC  14.20*  13.23*   Hgb  11.8*  13.1   Hematocrit  34.4*  38.9   Platelets  269  348   MCV  90.3  90.7       Recent Labs      05/01/16   0301  04/30/16   1823   Sodium   --   135*   Potassium   --   4.4   Chloride   --   104   CO2   --   18*   BUN   --   21.0*   Creatinine   --   1.3*   Glucose   --   116*   Calcium   --   9.9   Magnesium  2.2   --        Recent Labs      04/30/16   1823   AST (SGOT)  19   ALT  11   Alkaline Phosphatase  85   Protein, Total  7.3   Albumin  4.2   Bilirubin, Total  0.6         Imaging studies:           Thanks for the consultation          Signed by: Tenna Child, MD  Date Time: 05/01/16 11:55 AM      *This note was generated by the Epic EMR system/ Dragon speech recognition and may contain inherent errors or omissions not intended by the user. Grammatical errors, random word insertions, deletions, pronoun errors and incomplete sentences are occasional consequences of this technology due to software limitations. Not all errors are caught or corrected. If there are questions or concerns about the content of this note or information contained within the body of this dictation they should be addressed directly with the author for clarification

## 2016-05-01 NOTE — ED Notes (Signed)
NURSING NOTE FOR THE RECEIVING INPATIENT NURSE   ED NURSE [place]   [place] [address]   ADMISSION INFORMATION   Gina Cooper is a 82 y.o. female admitted with a diagnosis of:    1. Lower abdominal pain    2. Nausea    3. Diarrhea, unspecified type    4. Left bundle branch block    5. Near syncope    6. Cystitis       NURSING CARE   LOC Oriented to person, place, time, and general circumstances.   ADL Independent with all ADLs   Pertinent Information     VITAL SIGNS     Vitals:    05/01/16 1200   BP:    Pulse: 69   Resp:    Temp:    SpO2: 95%        IV LINES     IV Catheter Size: 20 g    Peripheral IV 04/30/16 Right Antecubital (Active)   Site Assessment Clean;Dry;Intact 05/01/2016 12:00 PM   Line Status Infusing 05/01/2016 12:00 PM   Tubing Dated? Yes 05/01/2016 12:00 PM   Dressing Status Clean;Dry;Intact 05/01/2016 12:00 PM   Dressing Dated? Yes 05/01/2016 12:00 PM   Dressing Change Due 05/07/16 05/01/2016  8:00 AM   Reason Not Rotated Not due 05/01/2016 12:00 PM   Number of days: 0          LAB RESULTS     Labs Reviewed   CBC AND DIFFERENTIAL - Abnormal; Notable for the following:        Result Value    WBC 13.23 (*)     MPV 8.8 (*)     Neutrophils Absolute 10.40 (*)     All other components within normal limits   COMPREHENSIVE METABOLIC PANEL - Abnormal; Notable for the following:     Glucose 116 (*)     BUN 21.0 (*)     Creatinine 1.3 (*)     Sodium 135 (*)     CO2 18 (*)     All other components within normal limits   URINALYSIS, REFLEX TO MICROSCOPIC EXAM IF INDICATED - Abnormal; Notable for the following:     Color, UA Amber (*)     Specific Gravity UA >1.035 (*)     Leukocyte Esterase, UA Large (*)     Protein, UR 30 (*)     Ketones UA Trace (*)     Blood, UA Small (*)     RBC, UA 11 - 25 (*)     WBC, UA 26 - 50 (*)     All other components within normal limits   CBC WITH MANUAL DIFFERENTIAL - Abnormal; Notable for the following:     WBC 14.20 (*)     Hgb 11.8 (*)     Hematocrit 34.4 (*)     RBC 3.81 (*)     MPV  8.7 (*)     Abs Seg Manual 12.07 (*)     All other components within normal limits   URINE CULTURE   TROPONIN I   LIPASE   PT/INR   HEMOLYSIS INDEX   GFR   TROPONIN I   MAGNESIUM   HEMOLYSIS INDEX   CBC WITH MANUAL DIFFERENTIAL   MAGNESIUM   HEMOLYSIS INDEX   TROPONIN I

## 2016-05-02 DIAGNOSIS — N179 Acute kidney failure, unspecified: Secondary | ICD-10-CM | POA: Insufficient documentation

## 2016-05-02 LAB — BASIC METABOLIC PANEL
Anion Gap: 9 (ref 5.0–15.0)
BUN: 8 mg/dL (ref 7.0–19.0)
CO2: 17 mEq/L — ABNORMAL LOW (ref 22–29)
Calcium: 8.6 mg/dL (ref 7.9–10.2)
Chloride: 108 mEq/L (ref 100–111)
Creatinine: 1 mg/dL (ref 0.6–1.0)
Glucose: 130 mg/dL — ABNORMAL HIGH (ref 70–100)
Potassium: 4 mEq/L (ref 3.5–5.1)
Sodium: 134 mEq/L — ABNORMAL LOW (ref 136–145)

## 2016-05-02 LAB — GFR: EGFR: 53.3

## 2016-05-02 LAB — TROPONIN I: Troponin I: 0.01 ng/mL (ref 0.00–0.09)

## 2016-05-02 LAB — PT/INR
PT INR: 1.3 — ABNORMAL HIGH (ref 0.9–1.1)
PT: 15.9 s — ABNORMAL HIGH (ref 12.6–15.0)

## 2016-05-02 LAB — HEMOLYSIS INDEX: Hemolysis Index: 36 — ABNORMAL HIGH (ref 0–18)

## 2016-05-02 MED ORDER — TRAMADOL HCL 50 MG PO TABS
25.0000 mg | ORAL_TABLET | Freq: Four times a day (QID) | ORAL | 0 refills | Status: AC | PRN
Start: 2016-05-02 — End: ?

## 2016-05-02 MED ORDER — METRONIDAZOLE 500 MG PO TABS
500.0000 mg | ORAL_TABLET | Freq: Three times a day (TID) | ORAL | 0 refills | Status: AC
Start: 2016-05-02 — End: 2016-05-07

## 2016-05-02 MED ORDER — LEVOFLOXACIN 250 MG PO TABS
250.0000 mg | ORAL_TABLET | Freq: Every day | ORAL | 0 refills | Status: AC
Start: 2016-05-02 — End: ?

## 2016-05-02 NOTE — Plan of Care (Signed)
Problem: Altered GI Function  Goal: Elimination patterns are normal or improving  Outcome: Progressing  Patient alert and oriented slept most part of the  Night ,had no complains of nausea and abdominal cramping. Patient reported she had 1 loose stool  With streaks of blood ,which was not seen by the nurse.patient encourage to express feelings and  nursing needs . Will continue to monitor and report changes.   05/02/16 0408   Goal/Interventions addressed this shift   Elimination patterns are normal or improving Report abnormal assessment to physician;Assess for normal bowel sounds;Monitor for abdominal distension;Monitor for abdominal discomfort;Assess for signs and symptoms of bleeding. Report signs of bleeding to physician;Reinforce education on foods that improve and complicate bowel movements and how activity and medications can affect bowel movements;Encourage /perform oral hygiene as appropriate;Anticipate/assist with toileting needs

## 2016-05-02 NOTE — Discharge Instr - Diet (Signed)
Cardiac Diet     Limit your salt (sodium) intake.  Salt makes your body hold water      Limit your fluid intake.  In some patients, drinking too much fluid can worsen heart failure.     Limit foods high in animal fats.     Excess salt (sodium) and fluid can cause symptoms such as shortness of breath, and swelling    Remember, these are GUIDELINES only.  Please consult with your Primary Care provider or Cardiologist for further instructions.

## 2016-05-02 NOTE — Discharge Instr - Activity (Signed)
As tolerated

## 2016-05-02 NOTE — Discharge Summary (Signed)
[facility]      Patient: Gina Cooper  Admission Date: 04/30/2016   DOB: 10/31/1933  Discharge Date: 05/02/2016    MRN: [id number]  Discharge Attending:Talen Poser  Roda Shutters  5:34 PM 05/02/2016      Referring Physician: Christa See, MD  PCP: Christa See, MD       DISCHARGE SUMMARY     Discharge Information   Admission Diagnosis:   Acute cystitis    Discharge Diagnosis:   Active Hospital Problems    Diagnosis   . AKI (acute kidney injury)   . Acute cystitis   . Dizziness   . Essential hypertension   . Colitis        Admission Condition: stable  Discharge Condition: improved  Consultants: renal, ID  Functional Status: as tolerated  Discharged to: home    Discharge Medications:     Medication List      START taking these medications    levoFLOXacin 250 MG tablet  Commonly known as:  LEVAQUIN  Take 1 tablet (250 mg total) by mouth daily.     metroNIDAZOLE 500 MG tablet  Commonly known as:  FLAGYL  Take 1 tablet (500 mg total) by mouth 3 (three) times daily.for 5 days  Notes to patient:  Next dose due after lunch     traMADol 50 MG tablet  Commonly known as:  ULTRAM  Take 0.5 tablets (25 mg total) by mouth every 6 (six) hours as needed for Pain.        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 History of diarrhea x 2. CT abdomen with IV contrast  showed possible colitis. UA was positive    Started on ceftazidime. Creatinine 1.3. Observed overnight for creatinine function which resolved to normal 1.0 the following day.    See HPI for details.    Hospital Course (2 Days)     Discharged home with antibiotics to complete 7 total days.     New medications: metronidazole and levaquin. Take with yogurt.    Make sure to bring the following to your doctor appointments:  Medications in their original bottles    Procedures/Imaging:   Ct Abdomen Pelvis W Iv/ Wo Po Cont    Result Date: 04/30/2016   1. Questionable thickening of the descending colon and proximal sigmoid. Recommend clinical correlation for colitis. No free fluid, free air or abscess. 2. Right nephrectomy changes. Left kidney is unremarkable. 3. Sclerotic change at L4 may represent a bone island. Metastasis is probably unlikely. Recommend clinical correlation. [place]  Teferra, MD 04/30/2016 8:13 PM  Best Practices   Was the patient admitted with either a CHF Exacerbation or Pneumonia? n     Progress Note/Physical Exam at Discharge     Subjective: afebrile HDS on RA.     Vitals:    05/01/16 1900 05/01/16 2351 05/02/16 0400 05/02/16 0831   BP: 114/55 113/56 121/58 126/58   Pulse: 67 71 65 68   Resp: 20 18 18 17    Temp: 97.4 F (36.3 C) 99.6 F (37.6 C) 99 F (37.2 C) 98.2 F (36.8 C)   TempSrc: Oral Oral Axillary Axillary   SpO2: 96% 94% 92% 93%   Weight:   63.5 kg (139 lb 15.9 oz)    Height:               General: NAD, AAOx3  HEENT: perrla, eomi, sclera anicteric: Clear mucous membranes  Neck: supple, full range of motion  Cardiovascular: RRR, no m/r/g  Lungs: CTAB, no w/r/r  Abdomen: soft, +BS, NT/ND, no masses  Extremities: no edema, cyanosis or clubbing  Skin: no rashes or lesions noted  Neuro: CN 2-12 intact; No Focal neurological deficits       Diagnostics     Labs/Studies Pending at Discharge: No    Last Labs     Recent Labs  Lab 05/01/16  0301 04/30/16  1823   WBC 14.20* 13.23*   RBC  3.81* 4.29   Hgb 11.8* 13.1   Hematocrit 34.4* 38.9   MCV 90.3 90.7   Platelets 269 348         Recent Labs  Lab 05/02/16  0841 05/01/16  0301 04/30/16  1823   Sodium 134*  --  135*   Potassium 4.0  --  4.4   Chloride 108  --  104   CO2 17*  --  18*   BUN 8.0  --  21.0*   Creatinine 1.0  --  1.3*   Glucose 130*  --  116*   Calcium 8.6  --  9.9   Magnesium  --  2.2  --        Microbiology Results     Procedure Component Value Units Date/Time    Urine culture [[id number]] Collected:  04/30/16 1952    Specimen:  Urine from Urine, Clean Catch Updated:  05/02/16 0128    Narrative:       ORDER#: [id number]                                    ORDERED BY: Orion Modest  SOURCE: Urine, Clean Catch                           COLLECTED:  04/30/16 19:52  ANTIBIOTICS AT COLL.:                                RECEIVED :  05/01/16 02:51  Culture Urine                              FINAL       05/02/16 01:28  05/02/16   1,000 - 9,000 CFU/ML of normal urogenital or skin microbiota, no further             work  Patient Instructions   Discharge Diet: renal   Discharge Activity: as tolerated    Follow Up Appointment:  Follow-up Information     [facility] Emergency Department.    Specialty:  Emergency Medicine  Why:  If symptoms worsen  Contact information:  [address]  [place] [address]  [phone]           Christa See, MD .                  Time spent examining patient, discussing with patient/family regarding hospital course, chart review, reconciling medications and discharge planning: 60 minutes.    Patrcia Dolly  5:34 PM 05/02/2016

## 2016-05-02 NOTE — Discharge Instr - AVS First Page (Signed)
Dear Ms. Gina Cooper,     Thank you for choosing [facility] for your emergency care needs. We strive to provide EXCELLENT care to you and your family.     In an effort to explain clearly the main reason of why you were here in the hospital, I've written a very brief summary. I hope that you find it useful. Other details including formal diagnosis, medication changes, follow up appointment recommendations, and access to MyChart for formal medical records can also be found in this packet.       You were admitted for colitis and UTI  for which you were started IV antibiotics. I am continuing your antibiotics for 5 more days. Make sure to also follow up with your primary care doctor for follow-up of post hospitalization and for refills of medications. I cannot stress the importance of follow up enough. I've included labs for your primary care's reference.    New medications: metronidazole and levaquin. Take with yogurt.    Make sure to bring the following to your doctor appointments:  Medications in their original bottles    Finally, as your discharging physician, you may be receiving a survey which is regarding my care. I would greatly value and appreciate your input in the survey as we strive for excellence.     Respectfully yours,    Kathe Mariner, MD         Ct Abdomen Pelvis W Iv/ Wo Po Cont    Result Date: 04/30/2016   1. Questionable thickening of the descending colon and proximal sigmoid. Recommend clinical correlation for colitis. No free fluid, free air or abscess. 2. Right nephrectomy changes. Left kidney is unremarkable. 3. Sclerotic change at L4 may represent a bone island. Metastasis is probably unlikely. Recommend clinical correlation. [place]  Teferra, MD 04/30/2016 8:13 PM         Lab Results last 48 Hours     Procedure Component Value Units Date/Time    Basic Metabolic Panel [[id number]]  (Abnormal) Collected:  05/02/16 0841    Specimen:  Blood Updated:  05/02/16 0904     Glucose 130 (H) mg/dL       BUN 8.0 mg/dL      Creatinine 1.0 mg/dL      Calcium 8.6 mg/dL      Sodium 433 (L) mEq/L      Potassium 4.0 mEq/L      Chloride 108 mEq/L      CO2 17 (L) mEq/L      Anion Gap 9.0    Hemolysis index [[id number]]  (Abnormal) Collected:  05/02/16 0841     Updated:  05/02/16 0904     Hemolysis Index 36 (H)    GFR [[id number]] Collected:  05/02/16 0841     Updated:  05/02/16 0904     EGFR 53.3    Troponin I [[id number]] Collected:  05/02/16 0421    Specimen:  Blood Updated:  05/02/16 0520     Troponin I 0.01 ng/mL     Prothrombin time/INR [[id number]]  (Abnormal) Collected:  05/02/16 0421    Specimen:  Blood Updated:  05/02/16 0513     PT 15.9 (H) sec      PT INR 1.3 (H)     PT Anticoag. Given Within 48 hrs. None    Urine culture [[id number]] Collected:  04/30/16 1952    Specimen:  Urine from Urine, Clean Catch Updated:  05/02/16 0128    Narrative:       ORDER#: [id number]  ORDERED BY: CARTER, ELIZABE  SOURCE: Urine, Clean Catch                           COLLECTED:  04/30/16 19:52  ANTIBIOTICS AT COLL.:                                RECEIVED :  05/01/16 02:51  Culture Urine                              FINAL       05/02/16 01:28  05/02/16   1,000 - 9,000 CFU/ML of normal urogenital or skin microbiota, no further             work      CBC WITH MANUAL DIFFERENTIAL [[id number]]  (Abnormal) Collected:  05/01/16 0301     Updated:  05/01/16 0348     WBC 14.20 (H) x10 3/uL      Hgb 11.8 (L) g/dL      Hematocrit 30.8 (L) %      Platelets 269 x10 3/uL      RBC 3.81 (L) x10 6/uL      MCV 90.3 fL      MCH 31.0 pg      MCHC 34.3 g/dL      RDW 13 %      MPV 8.7 (L) fL      Nucleated RBC 0.0 /100 WBC      Absolute NRBC 0.00 x10 3/uL      Segmented Neutrophils 85 %      Band Neutrophils 0 %      Lymphocytes Manual 9 %      Monocytes Manual 6 %      Eosinophils Manual 0 %      Basophils Manual 0 %      Abs Seg Manual 12.07 (H) x10 3/uL      Bands Absolute 0.00 x10 3/uL      Absolute Lymph Manual 1.28 x10 3/uL       Monocytes Absolute 0.85 x10 3/uL      Absolute Eos Manual 0.00 x10 3/uL      Absolute Baso Manual 0.00 x10 3/uL      Cell Morphology: Normal     Platelet Estimate Normal    Troponin I [[id number]] Collected:  05/01/16 0301    Specimen:  Blood Updated:  05/01/16 0338     Troponin I 0.01 ng/mL     Magnesium [[id number]] Collected:  05/01/16 0301     Updated:  05/01/16 0326     Magnesium 2.2 mg/dL     Hemolysis index [[id number]] Collected:  05/01/16 0301     Updated:  05/01/16 0326     Hemolysis Index 3    UA, Reflex to Microscopic (pts 3 + yrs) [[id number]]  (Abnormal) Collected:  04/30/16 1952    Specimen:  Urine Updated:  04/30/16 2020     Urine Type Clean Catch     Color, UA Amber (A)     Clarity, UA Hazy     Specific Gravity UA >1.035 (A)     Urine pH 5.0     Leukocyte Esterase, UA Large (A)     Nitrite, UA Negative     Protein, UR 30 (A)     Glucose, UA Negative     Ketones UA Trace (A)  Urobilinogen, UA 2.0 mg/dL      Bilirubin, UA Negative     Blood, UA Small (A)     RBC, UA 11 - 25 (A) /hpf      WBC, UA 26 - 50 (A) /hpf      Squamous Epithelial Cells, Urine 0 - 5 /hpf     Troponin I [[id number]] Collected:  04/30/16 1823    Specimen:  Blood Updated:  04/30/16 1854     Troponin I 0.01 ng/mL     Comprehensive metabolic panel [[id number]]  (Abnormal) Collected:  04/30/16 1823    Specimen:  Blood Updated:  04/30/16 1848     Glucose 116 (H) mg/dL      BUN 78.2 (H) mg/dL      Creatinine 1.3 (H) mg/dL      Sodium 956 (L) mEq/L      Potassium 4.4 mEq/L      Chloride 104 mEq/L      CO2 18 (L) mEq/L      Calcium 9.9 mg/dL      Protein, Total 7.3 g/dL      Albumin 4.2 g/dL      AST (SGOT) 19 U/L      ALT 11 U/L      Alkaline Phosphatase 85 U/L      Bilirubin, Total 0.6 mg/dL      Globulin 3.1 g/dL      Albumin/Globulin Ratio 1.4     Anion Gap 13.0    Lipase [[id number]] Collected:  04/30/16 1823    Specimen:  Blood Updated:  04/30/16 1848     Lipase 36 U/L     Hemolysis index [[id number]] Collected:  04/30/16 1823      Updated:  04/30/16 1848     Hemolysis Index 9    GFR [[id number]] Collected:  04/30/16 1823     Updated:  04/30/16 1848     EGFR 39.4    Prothrombin time/INR [[id number]] Collected:  04/30/16 1823    Specimen:  Blood Updated:  04/30/16 1838     PT 13.3 sec      PT INR 1.0     PT Anticoag. Given Within 48 hrs. None    CBC with differential [[id number]]  (Abnormal) Collected:  04/30/16 1823    Specimen:  Blood from Blood Updated:  04/30/16 1830     WBC 13.23 (H) x10 3/uL      Hgb 13.1 g/dL      Hematocrit 63.8 %      Platelets 348 x10 3/uL      RBC 4.29 x10 6/uL      MCV 90.7 fL      MCH 30.5 pg      MCHC 33.7 g/dL      RDW 13 %      MPV 8.8 (L) fL      Neutrophils 78.6 %      Lymphocytes Automated 12.8 %      Monocytes 6.9 %      Eosinophils Automated 0.7 %      Basophils Automated 0.6 %      Immature Granulocyte 0.4 %      Nucleated RBC 0.0 /100 WBC      Neutrophils Absolute 10.40 (H) x10 3/uL      Abs Lymph Automated 1.70 x10 3/uL      Abs Mono Automated 0.91 x10 3/uL      Abs Eos Automated 0.09 x10 3/uL      Absolute Baso Automated 0.08 x10 3/uL  Absolute Immature Granulocyte 0.05 x10 3/uL      Absolute NRBC 0.00 x10 3/uL

## 2016-05-02 NOTE — Progress Notes (Signed)
IV and telemetry [place]. [place] instructions, med rec, prescriptions, and follow up recommendation reviewed with pt, husband and daugther, all verbalized understanding and had all questions answered. Pt dressed and gathered all belongings. Awaiting wheelchair for transport off unit.

## 2016-05-02 NOTE — Progress Notes (Signed)
Infectious Diseases & Tropical Medicine  Progress Note    05/02/2016   Gina Cooper CSN:[id number],Gina Cooper:[id number] is a 82 y.o. female,       Assessment:      Descending and sigmoid colon colitis.   Urine culture no growth   Renal insufficiency resolved   History of nephrectomy.   Generalized anxiety disorder.   Clinically improved    Plan:      Okay to discharge from infectious disease point of view.   Levaquin and Flagyl 5 days.   Continue probiotics.   Discussed with Dr.Vu            ROS:     General:  no fever, no chills, no rigor, awake and alert,comfortable,feeling better  HEENT: no neck pain, no throat pain  Endocrine:  no fatigue, no night sweats  Respiratory: no cough, shortness of breath, or wheezing   Cardiovascular: no chest pain   Gastrointestinal: Denies any abdominal pain or diarrhea  Genito-Urinary: no dysuria, increased urinary frequency, trouble voiding, or hematuria   Musculoskeletal: no edema  Neurological: c/o generalized weakness   Dermatological: no rash, no ulcer    Physical Examination:     Blood pressure 126/58, pulse 68, temperature 98.2 F (36.8 C), temperature source Axillary, resp. rate 17, height 1.626 m (5\' 4" ), weight 63.5 kg (139 lb 15.9 oz), SpO2 93 %.     General Appearance: Comfortable, and in no acute distress.   HEENT: Pupils are equal, round, and reactive to light.    Lungs:  Clear to auscultation   Heart:  Regular rate and rhythm   Chest: Symmetric chest wall expansion.    Abdomen: soft ,non tender,no hepatosplenomegaly,good bowel sounds   Neurological: No focal deficit   Extremities: No edema    Laboratory And Diagnostic Studies:     Recent Labs      05/01/16   0301  04/30/16   1823   WBC  14.20*  13.23*   Hgb  11.8*  13.1   Hematocrit  34.4*  38.9   Platelets  269  348     Recent Labs      05/02/16   0841  04/30/16   1823   Sodium  134*  135*   Potassium  4.0  4.4   Chloride  108  104   CO2  17*  18*   BUN  8.0  21.0*   Creatinine  1.0  1.3*   Glucose  130*   116*   Calcium  8.6  9.9     Recent Labs      04/30/16   1823   AST (SGOT)  19   ALT  11   Alkaline Phosphatase  85   Protein, Total  7.3   Albumin  4.2       Current Meds:      Scheduled Meds: PRN Meds:      amLODIPine 5 mg Oral Daily   cefTAZidime 500 mg Intravenous Daily   heparin (porcine) 5,000 Units Subcutaneous Q8H [facility]   lactobacillus/streptococcus 1 capsule Oral Daily   pantoprazole 40 mg Intravenous Daily       Continuous Infusions:  . dextrose 5 % and 0.9% NaCl 100 mL/hr at 05/02/16 0659      hydrALAZINE 25 mg Q8H PRN   naloxone 0.2 mg PRN   oxybutynin 5 mg Q8H PRN   Ultram + Acetaminophen 50-325 combo dose  Q6H PRN   zolpidem 5 mg QHS PRN  Bryona Foxworthy A. Janalyn Rouse, M.D.  05/02/2016  10:01 AM

## 2016-05-05 NOTE — UM Notes (Signed)
05-05-16  Discharge 05-02-16 to home    [facility]      Patient: Gina Cooper  Admission Date: 04/30/2016   DOB: January 26, 1934  Discharge Date: 05/02/2016        DISCHARGE SUMMARY     Discharge Information   Admission Diagnosis:   Acute cystitis    Discharge Diagnosis:       Active Hospital Problems    Diagnosis   . AKI (acute kidney injury)   . Acute cystitis   . Dizziness   . Essential hypertension   . Colitis        Admission Condition: stable  Discharge Condition: improved  Consultants: renal, ID  Functional Status: as tolerated  Discharged to: home     Course   Presentation History   Teralyn Mullins Crowleyis a 82 y.o.femaleformer orthopedic nurse with R nephrectomy presents with near syncope after visiting her daughter from out of town. History of diarrhea x 2. CT abdomen with IV contrast showed possible colitis. UA was positive    Started on ceftazidime. Creatinine 1.3. Observed overnight for creatinine function which resolved to normal 1.0 the following day.    See HPI for details.    Hospital Course (2 Days)     Discharged home with antibiotics to complete 7 total days.     New medications: metronidazole and levaquin. Take with yogurt.    Make sure to bring the following to your doctor appointments:  Medications in their original bottles    Procedures/Imaging:   Ct Abdomen Pelvis W Iv/ Wo Po Cont    Result Date: 04/30/2016   1. Questionable thickening of the descending colon and proximal sigmoid. Recommend clinical correlation for colitis. No free fluid, free air or abscess. 2. Right nephrectomy changes. Left kidney is unremarkable. 3. Sclerotic change at L4 may represent a bone island. Metastasis is probably unlikely. Recommend clinical correlation. [place]  Teferra, MD 04/30/2016 8:13 PM     Lab 05/01/16  0301 04/30/16  1823   WBC 14.20* 13.23*   RBC 3.81* 4.29   Hgb 11.8* 13.1   Hematocrit 34.4* 38.9     Devin Going , RN BSN  Utilization Review Case Manager  Case Management  [facility]  [address]  [place], [place] [address]  T [phone] S [phone]  F [phone]

## 2016-06-26 DIAGNOSIS — H2512 Age-related nuclear cataract, left eye: Secondary | ICD-10-CM | POA: Diagnosis not present

## 2016-06-26 DIAGNOSIS — H25812 Combined forms of age-related cataract, left eye: Secondary | ICD-10-CM | POA: Diagnosis not present

## 2016-08-12 DIAGNOSIS — M810 Age-related osteoporosis without current pathological fracture: Secondary | ICD-10-CM | POA: Diagnosis not present

## 2016-09-22 ENCOUNTER — Encounter (HOSPITAL_COMMUNITY): Payer: Self-pay

## 2016-09-22 ENCOUNTER — Ambulatory Visit (HOSPITAL_COMMUNITY)
Admission: RE | Admit: 2016-09-22 | Discharge: 2016-09-22 | Disposition: A | Discharge status: Home / Self Care | Payer: PPO | Source: Ambulatory Visit | Attending: Internal Medicine | Admitting: Internal Medicine

## 2016-09-22 DIAGNOSIS — M810 Age-related osteoporosis without current pathological fracture: Secondary | ICD-10-CM | POA: Insufficient documentation

## 2016-09-22 MED ORDER — DENOSUMAB 60 MG/ML ~~LOC~~ SOLN
60.0000 mg | Freq: Once | SUBCUTANEOUS | Status: AC
  Administered 2016-09-22: 60 mg via SUBCUTANEOUS
  Filled 2016-09-22: qty 1

## 2016-09-22 NOTE — Discharge Instructions (Signed)
Denosumab injection /prolia °What is this medicine? °DENOSUMAB (den oh sue mab) slows bone breakdown. Prolia is used to treat osteoporosis in women after menopause and in men. Xgeva is used to treat a high calcium level due to cancer and to prevent bone fractures and other bone problems caused by multiple myeloma or cancer bone metastases. Xgeva is also used to treat giant cell tumor of the bone. °This medicine may be used for other purposes; ask your health care provider or pharmacist if you have questions. °COMMON BRAND NAME(S): Prolia, XGEVA °What should I tell my health care provider before I take this medicine? °They need to know if you have any of these conditions: °-dental disease °-having surgery or tooth extraction °-infection °-kidney disease °-low levels of calcium or Vitamin D in the blood °-malnutrition °-on hemodialysis °-skin conditions or sensitivity °-thyroid or parathyroid disease °-an unusual reaction to denosumab, other medicines, foods, dyes, or preservatives °-pregnant or trying to get pregnant °-breast-feeding °How should I use this medicine? °This medicine is for injection under the skin. It is given by a health care professional in a hospital or clinic setting. °If you are getting Prolia, a special MedGuide will be given to you by the pharmacist with each prescription and refill. Be sure to read this information carefully each time. °For Prolia, talk to your pediatrician regarding the use of this medicine in children. Special care may be needed. For Xgeva, talk to your pediatrician regarding the use of this medicine in children. While this drug may be prescribed for children as young as 13 years for selected conditions, precautions do apply. °Overdosage: If you think you have taken too much of this medicine contact a poison control center or emergency room at once. °NOTE: This medicine is only for you. Do not share this medicine with others. °What if I miss a dose? °It is important not to  miss your dose. Call your doctor or health care professional if you are unable to keep an appointment. °What may interact with this medicine? °Do not take this medicine with any of the following medications: °-other medicines containing denosumab °This medicine may also interact with the following medications: °-medicines that lower your chance of fighting infection °-steroid medicines like prednisone or cortisone °This list may not describe all possible interactions. Give your health care provider a list of all the medicines, herbs, non-prescription drugs, or dietary supplements you use. Also tell them if you smoke, drink alcohol, or use illegal drugs. Some items may interact with your medicine. °What should I watch for while using this medicine? °Visit your doctor or health care professional for regular checks on your progress. Your doctor or health care professional may order blood tests and other tests to see how you are doing. °Call your doctor or health care professional for advice if you get a fever, chills or sore throat, or other symptoms of a cold or flu. Do not treat yourself. This drug may decrease your body's ability to fight infection. Try to avoid being around people who are sick. °You should make sure you get enough calcium and vitamin D while you are taking this medicine, unless your doctor tells you not to. Discuss the foods you eat and the vitamins you take with your health care professional. °See your dentist regularly. Brush and floss your teeth as directed. Before you have any dental work done, tell your dentist you are receiving this medicine. °Do not become pregnant while taking this medicine or for 5 months after   stopping it. Talk with your doctor or health care professional about your birth control options while taking this medicine. Women should inform their doctor if they wish to become pregnant or think they might be pregnant. There is a potential for serious side effects to an unborn  child. Talk to your health care professional or pharmacist for more information. What side effects may I notice from receiving this medicine? Side effects that you should report to your doctor or health care professional as soon as possible: -allergic reactions like skin rash, itching or hives, swelling of the face, lips, or tongue -bone pain -breathing problems -dizziness -jaw pain, especially after dental work -redness, blistering, peeling of the skin -signs and symptoms of infection like fever or chills; cough; sore throat; pain or trouble passing urine -signs of low calcium like fast heartbeat, muscle cramps or muscle pain; pain, tingling, numbness in the hands or feet; seizures -unusual bleeding or bruising -unusually weak or tired Side effects that usually do not require medical attention (report to your doctor or health care professional if they continue or are bothersome): -constipation -diarrhea -headache -joint pain -loss of appetite -muscle pain -runny nose -tiredness -upset stomach This list may not describe all possible side effects. Call your doctor for medical advice about side effects. You may report side effects to FDA at 1-800-FDA-1088. Where should I keep my medicine? This medicine is only given in a clinic, doctor's office, or other health care setting and will not be stored at home. NOTE: This sheet is a summary. It may not cover all possible information. If you have questions about this medicine, talk to your doctor, pharmacist, or health care provider.  2018 Elsevier/Gold Standard (2016-07-01 19:17:21)

## 2016-10-21 DIAGNOSIS — M5136 Other intervertebral disc degeneration, lumbar region: Secondary | ICD-10-CM | POA: Diagnosis not present

## 2016-10-21 DIAGNOSIS — J45998 Other asthma: Secondary | ICD-10-CM | POA: Diagnosis not present

## 2016-10-21 DIAGNOSIS — Z23 Encounter for immunization: Secondary | ICD-10-CM | POA: Diagnosis not present

## 2016-10-21 DIAGNOSIS — C649 Malignant neoplasm of unspecified kidney, except renal pelvis: Secondary | ICD-10-CM | POA: Diagnosis not present

## 2016-10-21 DIAGNOSIS — E871 Hypo-osmolality and hyponatremia: Secondary | ICD-10-CM | POA: Diagnosis not present

## 2016-10-21 DIAGNOSIS — Z1389 Encounter for screening for other disorder: Secondary | ICD-10-CM | POA: Diagnosis not present

## 2016-10-21 DIAGNOSIS — Z6823 Body mass index (BMI) 23.0-23.9, adult: Secondary | ICD-10-CM | POA: Diagnosis not present

## 2016-10-21 DIAGNOSIS — E559 Vitamin D deficiency, unspecified: Secondary | ICD-10-CM | POA: Diagnosis not present

## 2016-10-21 DIAGNOSIS — E7800 Pure hypercholesterolemia, unspecified: Secondary | ICD-10-CM | POA: Diagnosis not present

## 2016-10-21 DIAGNOSIS — I129 Hypertensive chronic kidney disease with stage 1 through stage 4 chronic kidney disease, or unspecified chronic kidney disease: Secondary | ICD-10-CM | POA: Diagnosis not present

## 2016-10-21 DIAGNOSIS — N183 Chronic kidney disease, stage 3 (moderate): Secondary | ICD-10-CM | POA: Diagnosis not present

## 2016-10-21 DIAGNOSIS — D692 Other nonthrombocytopenic purpura: Secondary | ICD-10-CM | POA: Diagnosis not present

## 2016-11-12 ENCOUNTER — Ambulatory Visit (INDEPENDENT_AMBULATORY_CARE_PROVIDER_SITE_OTHER): Payer: PPO | Admitting: Physician Assistant

## 2016-11-12 ENCOUNTER — Encounter (INDEPENDENT_AMBULATORY_CARE_PROVIDER_SITE_OTHER): Payer: Self-pay

## 2016-11-12 ENCOUNTER — Ambulatory Visit (INDEPENDENT_AMBULATORY_CARE_PROVIDER_SITE_OTHER): Payer: PPO

## 2016-11-12 ENCOUNTER — Encounter (INDEPENDENT_AMBULATORY_CARE_PROVIDER_SITE_OTHER): Payer: Self-pay | Admitting: Physician Assistant

## 2016-11-12 VITALS — Ht 64.0 in | Wt 143.0 lb

## 2016-11-12 DIAGNOSIS — M25552 Pain in left hip: Secondary | ICD-10-CM

## 2016-11-12 DIAGNOSIS — M7072 Other bursitis of hip, left hip: Secondary | ICD-10-CM

## 2016-11-12 NOTE — Progress Notes (Signed)
Office Visit Note   Patient: Nicole Preston           Date of Birth: 03/18/1934           MRN: [id number] Visit Date: 11/12/2016              Requested by: Haywood Pao, MD [address] [place], [place] [address] PCP: Haywood Pao, MD   Assessment & Plan: Visit Diagnoses:  1. Pain in left hip   2. Other bursitis of hip, left hip     Plan: Offered cortisone injection left hip for hip bursitis she declines. Therefore I showed her some IT band stretching exercises. She'll follow up with [place] on an as-needed basis if pain becomes persistent despite conservative treatment consisting of stretching recommend that she follow up for injection.  Follow-Up Instructions: Return if symptoms worsen or fail to improve.   Orders:  Orders Placed This Encounter  Procedures  . XR HIP UNILAT W OR W/O PELVIS 1V LEFT   No orders of the defined types were placed in this encounter.     Procedures: No procedures performed   Clinical Data: No additional findings.   Subjective: Chief Complaint  Patient presents with  . 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 She states that at times it feels as if the hips, go out on her. She's followed a few times due to this. She having no groin pain. Pain is mainly on the lateral aspect of the thigh. No radicular symptoms no back pain. She does have pain whenever she lies on the left hip at night. Review of Systems Denies fevers chills shortness breath chest pain. Otherwise please see history of present illness  Objective: Vital Signs: Ht 5\' 4"  (1.626 m)   Wt 143 lb (64.9 kg)   BMI 24.55 kg/m   Physical Exam  Constitutional: She is oriented to person, place, and time. She appears well-developed and well-nourished. No distress.  Cardiovascular: Intact distal pulses.   Pulmonary/Chest: Effort normal.  Neurological: She is alert and oriented to person, place, and  time.  Psychiatric: She has a normal mood and affect. Her behavior is normal.    Ortho Exam Fluid motion of both hips without pain. She has tenderness over the left trochanteric region no tenderness over the right trochanteric region. Specialty Comments:  No specialty comments available.  Imaging: Xr Hip Unilat W Or W/o Pelvis 1v Left  Result Date: 11/12/2016 AP pelvis and lateral view of the left hip: No acute fractures. The left femoral head is well rounded. Some impingement inferior portion of the hip joint. Hip joint otherwise well-maintained. No bony abnormalities otherwise.    PMFS History: Patient Active Problem List   Diagnosis Date Noted  . Benign essential HTN 04/05/2014  . Superficial injury of shoulder with infection 03/21/2014  . Colon polyp 01/02/2012  . Hemorrhage of rectum and anus 12/09/2011  . Renal cell carcinoma ([place]) 12/09/2011   Past Medical History:  Diagnosis Date  . Arthritis   . COPD (chronic obstructive pulmonary disease) ([place])   . Hemorrhoids   . Hypertension   . Infection 02/2014   RT SHOULDER  . Osteopetrosis   . Osteoporosis   . Renal cancer ([place]) 4/12   rt nephrectomy  . S/p nephrectomy     Family History  Problem Relation Age of Onset  . Kidney cancer Mother   .  Colon cancer Neg Hx     Past Surgical History:  Procedure Laterality Date  . ABDOMINAL HYSTERECTOMY    . ABDOMINAL SURGERY  2012   for renal carcinoma tumor  . BICEPT TENODESIS Right 03/21/2014   Procedure: BICEPS TENODESIS;  Surgeon: Meredith Pel, MD;  Location: [facility];  Service: Orthopedics;  Laterality: Right;  . COLONOSCOPY  12/29/2011   Procedure: COLONOSCOPY;  Surgeon: Inda Castle, MD;  Location: [facility];  Service: Endoscopy;  Laterality: N/A;  . EYE SURGERY Left    cataract surgery  . IRRIGATION AND DEBRIDEMENT SHOULDER Right 03/21/2014   Procedure: IRRIGATION AND DEBRIDEMENT SHOULDER, BICEPS TENODESIS;  Surgeon: Meredith Pel, MD;  Location: [facility];   Service: Orthopedics;  Laterality: Right;  I&D RIGHT SHOULDER WITH BICEPS TENODESIS  . NEPHRECTOMY  2012   RT KIDNEY   Social History   Occupational History  . Retired     Social History Main Topics  . Smoking status: Former Smoker    Packs/day: 1.00    Types: Cigarettes    Quit date: 12/29/1983  . Smokeless tobacco: Never Used  . Alcohol use 8.4 oz/week    14 Glasses of wine per week     Comment: 1-2 glasses of wine/day  . Drug use: No  . Sexual activity: Not on file

## 2016-11-14 ENCOUNTER — Encounter: Payer: Self-pay | Admitting: Gastroenterology

## 2017-03-25 ENCOUNTER — Encounter (HOSPITAL_COMMUNITY): Payer: Self-pay

## 2017-03-25 ENCOUNTER — Ambulatory Visit (HOSPITAL_COMMUNITY)
Admission: RE | Admit: 2017-03-25 | Discharge: 2017-03-25 | Disposition: A | Discharge status: Home / Self Care | Payer: PPO | Source: Ambulatory Visit | Attending: Internal Medicine | Admitting: Internal Medicine

## 2017-03-25 DIAGNOSIS — E559 Vitamin D deficiency, unspecified: Secondary | ICD-10-CM | POA: Diagnosis not present

## 2017-03-25 DIAGNOSIS — M810 Age-related osteoporosis without current pathological fracture: Secondary | ICD-10-CM | POA: Insufficient documentation

## 2017-03-25 DIAGNOSIS — Z0000 Encounter for general adult medical examination without abnormal findings: Secondary | ICD-10-CM | POA: Diagnosis not present

## 2017-03-25 DIAGNOSIS — I129 Hypertensive chronic kidney disease with stage 1 through stage 4 chronic kidney disease, or unspecified chronic kidney disease: Secondary | ICD-10-CM | POA: Diagnosis not present

## 2017-03-25 DIAGNOSIS — E7800 Pure hypercholesterolemia, unspecified: Secondary | ICD-10-CM | POA: Diagnosis not present

## 2017-03-25 MED ORDER — DENOSUMAB 60 MG/ML ~~LOC~~ SOLN
60.0000 mg | Freq: Once | SUBCUTANEOUS | Status: AC
  Administered 2017-03-25: 60 mg via SUBCUTANEOUS
  Filled 2017-03-25: qty 1

## 2017-03-25 NOTE — Discharge Instructions (Signed)
Denosumab injection °What is this medicine? °DENOSUMAB (den oh sue mab) slows bone breakdown. Prolia is used to treat osteoporosis in women after menopause and in men. Xgeva is used to treat a high calcium level due to cancer and to prevent bone fractures and other bone problems caused by multiple myeloma or cancer bone metastases. Xgeva is also used to treat giant cell tumor of the bone. °This medicine may be used for other purposes; ask your health care provider or pharmacist if you have questions. °COMMON BRAND NAME(S): Prolia, XGEVA °What should I tell my health care provider before I take this medicine? °They need to know if you have any of these conditions: °-dental disease °-having surgery or tooth extraction °-infection °-kidney disease °-low levels of calcium or Vitamin D in the blood °-malnutrition °-on hemodialysis °-skin conditions or sensitivity °-thyroid or parathyroid disease °-an unusual reaction to denosumab, other medicines, foods, dyes, or preservatives °-pregnant or trying to get pregnant °-breast-feeding °How should I use this medicine? °This medicine is for injection under the skin. It is given by a health care professional in a hospital or clinic setting. °If you are getting Prolia, a special MedGuide will be given to you by the pharmacist with each prescription and refill. Be sure to read this information carefully each time. °For Prolia, talk to your pediatrician regarding the use of this medicine in children. Special care may be needed. For Xgeva, talk to your pediatrician regarding the use of this medicine in children. While this drug may be prescribed for children as young as 13 years for selected conditions, precautions do apply. °Overdosage: If you think you have taken too much of this medicine contact a poison control center or emergency room at once. °NOTE: This medicine is only for you. Do not share this medicine with others. °What if I miss a dose? °It is important not to miss your  dose. Call your doctor or health care professional if you are unable to keep an appointment. °What may interact with this medicine? °Do not take this medicine with any of the following medications: °-other medicines containing denosumab °This medicine may also interact with the following medications: °-medicines that lower your chance of fighting infection °-steroid medicines like prednisone or cortisone °This list may not describe all possible interactions. Give your health care provider a list of all the medicines, herbs, non-prescription drugs, or dietary supplements you use. Also tell them if you smoke, drink alcohol, or use illegal drugs. Some items may interact with your medicine. °What should I watch for while using this medicine? °Visit your doctor or health care professional for regular checks on your progress. Your doctor or health care professional may order blood tests and other tests to see how you are doing. °Call your doctor or health care professional for advice if you get a fever, chills or sore throat, or other symptoms of a cold or flu. Do not treat yourself. This drug may decrease your body's ability to fight infection. Try to avoid being around people who are sick. °You should make sure you get enough calcium and vitamin D while you are taking this medicine, unless your doctor tells you not to. Discuss the foods you eat and the vitamins you take with your health care professional. °See your dentist regularly. Brush and floss your teeth as directed. Before you have any dental work done, tell your dentist you are receiving this medicine. °Do not become pregnant while taking this medicine or for 5 months after stopping   it. Talk with your doctor or health care professional about your birth control options while taking this medicine. Women should inform their doctor if they wish to become pregnant or think they might be pregnant. There is a potential for serious side effects to an unborn child. Talk  to your health care professional or pharmacist for more information. What side effects may I notice from receiving this medicine? Side effects that you should report to your doctor or health care professional as soon as possible: -allergic reactions like skin rash, itching or hives, swelling of the face, lips, or tongue -bone pain -breathing problems -dizziness -jaw pain, especially after dental work -redness, blistering, peeling of the skin -signs and symptoms of infection like fever or chills; cough; sore throat; pain or trouble passing urine -signs of low calcium like fast heartbeat, muscle cramps or muscle pain; pain, tingling, numbness in the hands or feet; seizures -unusual bleeding or bruising -unusually weak or tired Side effects that usually do not require medical attention (report to your doctor or health care professional if they continue or are bothersome): -constipation -diarrhea -headache -joint pain -loss of appetite -muscle pain -runny nose -tiredness -upset stomach This list may not describe all possible side effects. Call your doctor for medical advice about side effects. You may report side effects to FDA at 1-800-FDA-1088. Where should I keep my medicine? This medicine is only given in a clinic, doctor's office, or other health care setting and will not be stored at home. NOTE: This sheet is a summary. It may not cover all possible information. If you have questions about this medicine, talk to your doctor, pharmacist, or health care provider.  2018 Elsevier/Gold Standard (2016-07-01 19:17:21)

## 2017-04-01 DIAGNOSIS — Z6824 Body mass index (BMI) 24.0-24.9, adult: Secondary | ICD-10-CM | POA: Diagnosis not present

## 2017-04-01 DIAGNOSIS — E559 Vitamin D deficiency, unspecified: Secondary | ICD-10-CM | POA: Diagnosis not present

## 2017-04-01 DIAGNOSIS — E7800 Pure hypercholesterolemia, unspecified: Secondary | ICD-10-CM | POA: Diagnosis not present

## 2017-04-01 DIAGNOSIS — Z1389 Encounter for screening for other disorder: Secondary | ICD-10-CM | POA: Diagnosis not present

## 2017-04-01 DIAGNOSIS — M5136 Other intervertebral disc degeneration, lumbar region: Secondary | ICD-10-CM | POA: Diagnosis not present

## 2017-04-01 DIAGNOSIS — J45998 Other asthma: Secondary | ICD-10-CM | POA: Diagnosis not present

## 2017-04-01 DIAGNOSIS — M479 Spondylosis, unspecified: Secondary | ICD-10-CM | POA: Diagnosis not present

## 2017-04-01 DIAGNOSIS — N183 Chronic kidney disease, stage 3 (moderate): Secondary | ICD-10-CM | POA: Diagnosis not present

## 2017-04-01 DIAGNOSIS — Z0000 Encounter for general adult medical examination without abnormal findings: Secondary | ICD-10-CM | POA: Diagnosis not present

## 2017-04-01 DIAGNOSIS — D692 Other nonthrombocytopenic purpura: Secondary | ICD-10-CM | POA: Diagnosis not present

## 2017-04-01 DIAGNOSIS — C649 Malignant neoplasm of unspecified kidney, except renal pelvis: Secondary | ICD-10-CM | POA: Diagnosis not present

## 2017-04-01 DIAGNOSIS — I129 Hypertensive chronic kidney disease with stage 1 through stage 4 chronic kidney disease, or unspecified chronic kidney disease: Secondary | ICD-10-CM | POA: Diagnosis not present

## 2017-06-30 DIAGNOSIS — H2511 Age-related nuclear cataract, right eye: Secondary | ICD-10-CM | POA: Diagnosis not present

## 2017-06-30 DIAGNOSIS — H5213 Myopia, bilateral: Secondary | ICD-10-CM | POA: Diagnosis not present

## 2017-09-08 DIAGNOSIS — J3089 Other allergic rhinitis: Secondary | ICD-10-CM | POA: Diagnosis not present

## 2017-09-08 DIAGNOSIS — J029 Acute pharyngitis, unspecified: Secondary | ICD-10-CM | POA: Diagnosis not present

## 2017-09-08 DIAGNOSIS — Z6824 Body mass index (BMI) 24.0-24.9, adult: Secondary | ICD-10-CM | POA: Diagnosis not present

## 2017-10-28 ENCOUNTER — Encounter (INDEPENDENT_AMBULATORY_CARE_PROVIDER_SITE_OTHER): Payer: Self-pay | Admitting: Orthopaedic Surgery

## 2017-10-28 ENCOUNTER — Ambulatory Visit (INDEPENDENT_AMBULATORY_CARE_PROVIDER_SITE_OTHER): Payer: PPO | Admitting: Orthopaedic Surgery

## 2017-10-28 ENCOUNTER — Ambulatory Visit (INDEPENDENT_AMBULATORY_CARE_PROVIDER_SITE_OTHER): Payer: PPO

## 2017-10-28 DIAGNOSIS — M1712 Unilateral primary osteoarthritis, left knee: Secondary | ICD-10-CM | POA: Diagnosis not present

## 2017-10-28 DIAGNOSIS — M25562 Pain in left knee: Secondary | ICD-10-CM | POA: Diagnosis not present

## 2017-10-28 NOTE — Progress Notes (Signed)
Office Visit Note   Patient: Nicole Preston           Date of Birth: September 15, 1933           MRN: [id number] Visit Date: 10/28/2017              Requested by: Haywood Pao, MD [address] [place], [place] [address] PCP: Haywood Pao, MD   Assessment & Plan: Visit Diagnoses:  1. Left knee pain, unspecified chronicity   2. Unilateral primary osteoarthritis, left knee     Plan: I do feel that she would benefit from an intra-articular steroid injection of her left knee and she agrees with this plan.  Risks and benefits of this were explained to her in detail and she tolerated it well.  She is definitely a candidate for hyaluronic acid as well.  I showed her exercises to try for her knee to work on quad strengthening.  We will see her back in 1 month to hopefully place a hyaluronic acid injection in her left knee.  All question concerns were answered and addressed.  Follow-Up Instructions: Return in about 1 month (around 11/25/2017).   Orders:  Orders Placed This Encounter  Procedures  . XR KNEE 3 VIEW LEFT   No orders of the defined types were placed in this encounter.     Procedures: No procedures performed   Clinical Data: No additional findings.   Subjective: Chief Complaint  Patient presents with  . Left Knee - Pain  The patient comes in today with acute left knee pain.  She is been using her exercise bike quite a bit and is developed pain with pivoting activities with her left knee.  She is very active individual and is never injured this knee before.  Is a slowly gotten worse over the last few days the last night really flared up quite a bit on her.  She points the medial joint line as the main source of her pain.   HPI  Review of Systems She currently denies any headache, chest pain, shortness of breath, fever, chills, nausea, vomiting.   Objective: Vital Signs: There were no vitals taken for this visit.  Physical Exam Ortho Exam She is alert and  oriented x3 and in no acute distress.  Examination of her left knee shows just some slight warmth and slight effusion.  She has pain along the medial joint line and a positive worse on the medial side.  She has excellent range of motion of her left knee and it feels like mostly stable.  There is significant patellofemoral crepitation.   Specialty Comments:  No specialty comments available.  Imaging: Xr Knee 3 View Left  Result Date: 10/28/2017 3 views of the left knee show significant patellofemoral arthritic changes and slight osteopenia of the bone but otherwise no effusion and no gross or acute findings.  The alignment is well-maintained other than patellofemoral arthritic changes.  Medial lateral compartments are well-maintained.    PMFS History: Patient Active Problem List   Diagnosis Date Noted  . Benign essential HTN 04/05/2014  . Superficial injury of shoulder with infection 03/21/2014  . Colon polyp 01/02/2012  . Hemorrhage of rectum and anus 12/09/2011  . Renal cell carcinoma ([facility]) 12/09/2011   Past Medical History:  Diagnosis Date  . Arthritis   . COPD (chronic obstructive pulmonary disease) ([facility])   . Hemorrhoids   . Hypertension   . Infection 02/2014   RT SHOULDER  . Osteopetrosis   .  Osteoporosis   . Renal cancer ([facility]) 4/12   rt nephrectomy  . S/p nephrectomy     Family History  Problem Relation Age of Onset  . Kidney cancer Mother   . Colon cancer Neg Hx     Past Surgical History:  Procedure Laterality Date  . ABDOMINAL HYSTERECTOMY    . ABDOMINAL SURGERY  2012   for renal carcinoma tumor  . BICEPT TENODESIS Right 03/21/2014   Procedure: BICEPS TENODESIS;  Surgeon: Meredith Pel, MD;  Location: [facility];  Service: Orthopedics;  Laterality: Right;  . COLONOSCOPY  12/29/2011   Procedure: COLONOSCOPY;  Surgeon: Inda Castle, MD;  [facility];  Service: Endoscopy;  Laterality: N/A;  . EYE SURGERY Left    cataract surgery  . IRRIGATION AND  DEBRIDEMENT SHOULDER Right 03/21/2014   Procedure: IRRIGATION AND DEBRIDEMENT SHOULDER, BICEPS TENODESIS;  Surgeon: Meredith Pel, MD;  Location: [facility];  Service: Orthopedics;  Laterality: Right;  I&D RIGHT SHOULDER WITH BICEPS TENODESIS  . NEPHRECTOMY  2012   RT KIDNEY   Social History   Occupational History  . Occupation: Retired   Tobacco Use  . Smoking status: Former Smoker    Packs/day: 1.00    Types: Cigarettes    Last attempt to quit: 12/29/1983    Years since quitting: 33.8  . Smokeless tobacco: Never Used  Substance and Sexual Activity  . Alcohol use: Yes    Alcohol/week: 8.4 oz    Types: 14 Glasses of wine per week    Comment: 1-2 glasses of wine/day  . Drug use: No  . Sexual activity: Not on file

## 2017-10-29 ENCOUNTER — Telehealth (INDEPENDENT_AMBULATORY_CARE_PROVIDER_SITE_OTHER): Payer: Self-pay

## 2017-10-29 NOTE — Telephone Encounter (Signed)
Submitted application online for Monovisc injection, left knee. 

## 2017-11-23 ENCOUNTER — Telehealth (INDEPENDENT_AMBULATORY_CARE_PROVIDER_SITE_OTHER): Payer: Self-pay

## 2017-11-23 NOTE — Telephone Encounter (Signed)
Approved for Monovisc Injection, left knee. Buy & Bill No PA required Patient will be responsible for 20% OOP, which could be an estimate of $350.00.

## 2017-11-25 ENCOUNTER — Ambulatory Visit (INDEPENDENT_AMBULATORY_CARE_PROVIDER_SITE_OTHER): Payer: PPO | Admitting: Orthopaedic Surgery

## 2017-12-01 DIAGNOSIS — Z09 Encounter for follow-up examination after completed treatment for conditions other than malignant neoplasm: Secondary | ICD-10-CM | POA: Diagnosis not present

## 2017-12-01 DIAGNOSIS — C649 Malignant neoplasm of unspecified kidney, except renal pelvis: Secondary | ICD-10-CM | POA: Diagnosis not present

## 2017-12-01 DIAGNOSIS — Z85528 Personal history of other malignant neoplasm of kidney: Secondary | ICD-10-CM | POA: Diagnosis not present

## 2017-12-03 ENCOUNTER — Other Ambulatory Visit: Payer: Self-pay | Admitting: Urology

## 2017-12-03 DIAGNOSIS — C649 Malignant neoplasm of unspecified kidney, except renal pelvis: Secondary | ICD-10-CM

## 2017-12-21 ENCOUNTER — Other Ambulatory Visit: Payer: PPO

## 2018-01-06 ENCOUNTER — Ambulatory Visit
Admission: RE | Admit: 2018-01-06 | Discharge: 2018-01-06 | Disposition: A | Discharge status: Home / Self Care | Payer: PPO | Source: Ambulatory Visit | Attending: Urology | Admitting: Urology

## 2018-01-06 DIAGNOSIS — K5900 Constipation, unspecified: Secondary | ICD-10-CM | POA: Diagnosis not present

## 2018-01-06 DIAGNOSIS — C649 Malignant neoplasm of unspecified kidney, except renal pelvis: Secondary | ICD-10-CM

## 2018-01-06 MED ORDER — IOPAMIDOL (ISOVUE-300) INJECTION 61%
100.0000 mL | Freq: Once | INTRAVENOUS | Status: AC | PRN
  Administered 2018-01-06: 100 mL via INTRAVENOUS

## 2018-02-10 ENCOUNTER — Encounter: Payer: Self-pay | Admitting: Gastroenterology

## 2018-02-10 ENCOUNTER — Other Ambulatory Visit (INDEPENDENT_AMBULATORY_CARE_PROVIDER_SITE_OTHER): Payer: PPO

## 2018-02-10 ENCOUNTER — Encounter (INDEPENDENT_AMBULATORY_CARE_PROVIDER_SITE_OTHER): Payer: Self-pay

## 2018-02-10 ENCOUNTER — Ambulatory Visit (INDEPENDENT_AMBULATORY_CARE_PROVIDER_SITE_OTHER): Payer: PPO | Admitting: Gastroenterology

## 2018-02-10 VITALS — BP 90/66 | HR 64 | Ht 64.0 in | Wt 145.0 lb

## 2018-02-10 DIAGNOSIS — R194 Change in bowel habit: Secondary | ICD-10-CM

## 2018-02-10 DIAGNOSIS — R197 Diarrhea, unspecified: Secondary | ICD-10-CM | POA: Diagnosis not present

## 2018-02-10 LAB — CBC WITH DIFFERENTIAL/PLATELET
BASOS ABS: 0.1 10*3/uL (ref 0.0–0.1)
Basophils Relative: 1.4 % (ref 0.0–3.0)
EOS PCT: 2.8 % (ref 0.0–5.0)
Eosinophils Absolute: 0.1 10*3/uL (ref 0.0–0.7)
HEMATOCRIT: 44.3 % (ref 36.0–46.0)
Hemoglobin: 15.1 g/dL — ABNORMAL HIGH (ref 12.0–15.0)
LYMPHS ABS: 1.6 10*3/uL (ref 0.7–4.0)
LYMPHS PCT: 33.8 % (ref 12.0–46.0)
MCHC: 34.1 g/dL (ref 30.0–36.0)
MCV: 91.7 fl (ref 78.0–100.0)
MONOS PCT: 11.8 % (ref 3.0–12.0)
Monocytes Absolute: 0.6 10*3/uL (ref 0.1–1.0)
NEUTROS ABS: 2.4 10*3/uL (ref 1.4–7.7)
NEUTROS PCT: 50.2 % (ref 43.0–77.0)
Platelets: 325 10*3/uL (ref 150.0–400.0)
RBC: 4.84 Mil/uL (ref 3.87–5.11)
RDW: 14 % (ref 11.5–15.5)
WBC: 4.7 10*3/uL (ref 4.0–10.5)

## 2018-02-10 LAB — COMPREHENSIVE METABOLIC PANEL
ALK PHOS: 56 U/L (ref 39–117)
ALT: 10 U/L (ref 0–35)
AST: 15 U/L (ref 0–37)
Albumin: 4.6 g/dL (ref 3.5–5.2)
BILIRUBIN TOTAL: 1 mg/dL (ref 0.2–1.2)
BUN: 13 mg/dL (ref 6–23)
CALCIUM: 10.2 mg/dL (ref 8.4–10.5)
CO2: 22 meq/L (ref 19–32)
Chloride: 100 mEq/L (ref 96–112)
Creatinine, Ser: 1.09 mg/dL (ref 0.40–1.20)
GFR: 51.04 mL/min — AB (ref >60.00)
GLUCOSE: 69 mg/dL — AB (ref 70–99)
POTASSIUM: 3.8 meq/L (ref 3.5–5.1)
Sodium: 134 mEq/L — ABNORMAL LOW (ref 135–145)
Total Protein: 7.7 g/dL (ref 6.0–8.3)

## 2018-02-10 LAB — TSH: TSH: 1.55 u[IU]/mL (ref 0.35–4.50)

## 2018-02-10 LAB — SEDIMENTATION RATE: SED RATE: 6 mm/h (ref 0–30)

## 2018-02-10 NOTE — Progress Notes (Signed)
Review of pertinent gastrointestinal problems: 1. History of adenomatous polyps: colonoscopy Dr. Deatra Ina 12/2011 found a 65mm polyp, was TVA by path.  He documented difficult angulation at the sigmoid. Post procedure she had significant abd pains, presented to ER CT showed thickened sigmoid with associated nearby free fluid, all c/w bowel injury. Was put on oral abx and pains improved after short time.    HPI: This is a very pleasant 84 year old woman   who was referred to me by Tisovec, Fransico Him, MD  to evaluate chronic diarrhea.    Chief complaint is chronic diarrhea   RCC Stage IV; treated by surgery alone.7 years ago.  She is considered cured.  She recently saw her urologist and he did a CT scan for some abdominal pains, alternating constipation diarrhea July 2019.  This was essentially normal.  Chronic diarrhea; since January 2019;  Previously a bit constipated.  She has intermittent cramping.  She tried pepto, but nothing else.  Wears a pad now.  Can go 3-4 times per day.  Never nocturnal.  Never bleeding.  Gets antibioitcs periodically (UTIs); last was about a month ago.    She has cut out most of her caffeine and etoh.  Watches dairy intake.  Gaining weight lately.     Review of systems: Pertinent positive and negative review of systems were noted in the above HPI section. All other review negative.   Past Medical History:  Diagnosis Date  . Arthritis   . COPD (chronic obstructive pulmonary disease) ([employer])   . Hemorrhoids   . Hypertension   . Infection 02/2014   RT SHOULDER  . Osteopetrosis   . Osteoporosis   . Renal cancer ([place]) 4/12   rt nephrectomy  . S/p nephrectomy     Past Surgical History:  Procedure Laterality Date  . ABDOMINAL HYSTERECTOMY    . ABDOMINAL SURGERY  2012   for renal carcinoma tumor  . BICEPT TENODESIS Right 03/21/2014   Procedure: BICEPS TENODESIS;  Surgeon: Meredith Pel, MD;  Location: [facility];  Service: Orthopedics;  Laterality: Right;   . COLONOSCOPY  12/29/2011   Procedure: COLONOSCOPY;  Surgeon: Inda Castle, MD;  Location: [facility];  Service: Endoscopy;  Laterality: N/A;  . EYE SURGERY Left    cataract surgery  . IRRIGATION AND DEBRIDEMENT SHOULDER Right 03/21/2014   Procedure: IRRIGATION AND DEBRIDEMENT SHOULDER, BICEPS TENODESIS;  Surgeon: Meredith Pel, MD;  Location: [facility];  Service: Orthopedics;  Laterality: Right;  I&D RIGHT SHOULDER WITH BICEPS TENODESIS  . NEPHRECTOMY  2012   RT KIDNEY    Current Outpatient Medications  Medication Sig Dispense Refill  . benazepril (LOTENSIN) 20 MG tablet Take 20 mg by mouth daily.    . cefTRIAXone 2 g in dextrose 5 % 50 mL Inject 2 g into the vein daily. 2 g 30  . cholecalciferol (VITAMIN D) 1000 UNITS tablet Take 1,000 Units by mouth daily.     . diphenhydrAMINE (BENADRYL) 25 MG tablet Take 12.5 mg by mouth at bedtime as needed for sleep.    Marland Kitchen LYSINE PO Take 1 tablet by mouth daily.    . ranitidine (ZANTAC) 150 MG tablet Take 150 mg by mouth as needed for heartburn.    . vancomycin 500 mg in sodium chloride 0.9 % 100 mL Inject 500 mg into the vein every 12 (twelve) hours. 500 mg 60   No current facility-administered medications for this visit.     Allergies as of 02/10/2018  . (No Known  Allergies)    Family History  Problem Relation Age of Onset  . Kidney cancer Mother   . Melanoma Father   . Lung cancer Brother   . Colon cancer Neg Hx     Social History   Socioeconomic History  . Marital status: Married    Spouse name: Not on file  . Number of children: 5  . Years of education: Not on file  . Highest education level: Not on file  Occupational History  . Occupation: Retired   Scientific laboratory technician  . Financial resource strain: Not on file  . Food insecurity:    Worry: Not on file    Inability: Not on file  . Transportation needs:    Medical: Not on file    Non-medical: Not on file  Tobacco Use  . Smoking status: Former Smoker    Packs/day: 1.00     Types: Cigarettes    Last attempt to quit: 12/29/1983    Years since quitting: 34.1  . Smokeless tobacco: Never Used  Substance and Sexual Activity  . Alcohol use: Yes    Alcohol/week: 14.0 standard drinks    Types: 14 Glasses of wine per week    Comment: 1-2 glasses of wine/day  . Drug use: No  . Sexual activity: Not on file  Lifestyle  . Physical activity:    Days per week: Not on file    Minutes per session: Not on file  . Stress: Not on file  Relationships  . Social connections:    Talks on phone: Not on file    Gets together: Not on file    Attends religious service: Not on file    Active member of club or organization: Not on file    Attends meetings of clubs or organizations: Not on file    Relationship status: Not on file  . Intimate partner violence:    Fear of current or ex partner: Not on file    Emotionally abused: Not on file    Physically abused: Not on file    Forced sexual activity: Not on file  Other Topics Concern  . Not on file  Social History Narrative   Daily caffeine      Physical Exam: BP 90/66   Pulse 64   Ht 5\' 4"  (1.626 m)   Wt 145 lb (65.8 kg)   BMI 24.89 kg/m  Constitutional: generally well-appearing Psychiatric: alert and oriented x3 Eyes: extraocular movements intact Mouth: oral pharynx moist, no lesions Neck: supple no lymphadenopathy Cardiovascular: heart regular rate and rhythm Lungs: clear to auscultation bilaterally Abdomen: soft, nontender, nondistended, no obvious ascites, no peritoneal signs, normal bowel sounds Extremities: no lower extremity edema bilaterally Skin: no lesions on visible extremities   Assessment and plan: 84 y.o. female with several months of bowel changes  This has evolved into chronic loose stools.  She will go 3-4 times a day.  Never bloody, never nocturnal issues.  I recommended starting with blood test, stool testing to check for chronic infection, signs of inflammation, thyroid issues.  If the lab  testing is unrevealing then we would need consider colonoscopy since she has had a tubulovillous adenoma removed.  This would be probably higher risk than usual since 2013 colonoscopy was complicated by significant postprocedural pain, thickening of her sigmoid.  I suspect she probably had some damage to her colon wall but fortunately did not require surgery and resolve with antibiotics alone.  Another option would be simply starting empiric treatment with an  agent such as Imodium once daily and follow her closely.  She is gaining weight and CT scan recently did not show any sign of obvious colonic issues.    Please see the "Patient Instructions" section for addition details about the plan.   Owens Loffler, MD [facility] 02/10/2018, 8:36 AM  Cc: Haywood Pao, MD

## 2018-02-10 NOTE — Patient Instructions (Addendum)
You will have labs checked today in the basement lab.  Please head down after you check out with the front desk  (cbc, cmet, tsh, esr, stool for gi pathogen panel and c. Diff by PCR and toxin assay)._0  Normal BMI (Body Mass Index- based on height and weight) is between 23 and 30. Your BMI today is Body mass index is 24.89 kg/m. Marland Kitchen Please consider follow up  regarding your BMI with your Primary Care Provider.

## 2018-02-18 ENCOUNTER — Telehealth: Payer: Self-pay | Admitting: Gastroenterology

## 2018-02-18 LAB — GASTROINTESTINAL PATHOGEN PANEL PCR
C. DIFFICILE TOX A/B, PCR: NOT DETECTED
Campylobacter, PCR: NOT DETECTED
Cryptosporidium, PCR: NOT DETECTED
E coli (ETEC) LT/ST PCR: NOT DETECTED
E coli (STEC) stx1/stx2, PCR: NOT DETECTED
E coli 0157, PCR: NOT DETECTED
Giardia lamblia, PCR: NOT DETECTED
Norovirus, PCR: NOT DETECTED
ROTAVIRUS, PCR: NOT DETECTED
Salmonella, PCR: NOT DETECTED
Shigella, PCR: NOT DETECTED

## 2018-02-18 LAB — CLOSTRIDIUM DIFFICILE TOXIN B, QUALITATIVE, REAL-TIME PCR: Toxigenic C. Difficile by PCR: NOT DETECTED

## 2018-02-18 NOTE — Telephone Encounter (Signed)
The pt has been advised of the normal stool test and verbalized understanding.  She will call with any further concerns or problems

## 2018-03-02 ENCOUNTER — Ambulatory Visit: Payer: PPO | Admitting: Physician Assistant

## 2018-03-30 DIAGNOSIS — E7800 Pure hypercholesterolemia, unspecified: Secondary | ICD-10-CM | POA: Diagnosis not present

## 2018-03-30 DIAGNOSIS — R82998 Other abnormal findings in urine: Secondary | ICD-10-CM | POA: Diagnosis not present

## 2018-03-30 DIAGNOSIS — I10 Essential (primary) hypertension: Secondary | ICD-10-CM | POA: Diagnosis not present

## 2018-03-30 DIAGNOSIS — E559 Vitamin D deficiency, unspecified: Secondary | ICD-10-CM | POA: Diagnosis not present

## 2018-04-06 DIAGNOSIS — R808 Other proteinuria: Secondary | ICD-10-CM | POA: Diagnosis not present

## 2018-04-06 DIAGNOSIS — I129 Hypertensive chronic kidney disease with stage 1 through stage 4 chronic kidney disease, or unspecified chronic kidney disease: Secondary | ICD-10-CM | POA: Diagnosis not present

## 2018-04-06 DIAGNOSIS — N183 Chronic kidney disease, stage 3 (moderate): Secondary | ICD-10-CM | POA: Diagnosis not present

## 2018-04-06 DIAGNOSIS — C649 Malignant neoplasm of unspecified kidney, except renal pelvis: Secondary | ICD-10-CM | POA: Diagnosis not present

## 2018-04-06 DIAGNOSIS — I10 Essential (primary) hypertension: Secondary | ICD-10-CM | POA: Diagnosis not present

## 2018-04-06 DIAGNOSIS — Z0000 Encounter for general adult medical examination without abnormal findings: Secondary | ICD-10-CM | POA: Diagnosis not present

## 2018-04-06 DIAGNOSIS — J449 Chronic obstructive pulmonary disease, unspecified: Secondary | ICD-10-CM | POA: Diagnosis not present

## 2018-04-06 DIAGNOSIS — Q631 Lobulated, fused and horseshoe kidney: Secondary | ICD-10-CM | POA: Diagnosis not present

## 2018-04-06 DIAGNOSIS — Z1389 Encounter for screening for other disorder: Secondary | ICD-10-CM | POA: Diagnosis not present

## 2018-04-06 DIAGNOSIS — E559 Vitamin D deficiency, unspecified: Secondary | ICD-10-CM | POA: Diagnosis not present

## 2018-04-06 DIAGNOSIS — J45998 Other asthma: Secondary | ICD-10-CM | POA: Diagnosis not present

## 2018-04-06 DIAGNOSIS — Z6825 Body mass index (BMI) 25.0-25.9, adult: Secondary | ICD-10-CM | POA: Diagnosis not present

## 2018-04-23 ENCOUNTER — Ambulatory Visit: Payer: PPO | Admitting: Gastroenterology

## 2018-05-06 DIAGNOSIS — S22000A Wedge compression fracture of unspecified thoracic vertebra, initial encounter for closed fracture: Secondary | ICD-10-CM | POA: Diagnosis not present

## 2018-05-06 DIAGNOSIS — I10 Essential (primary) hypertension: Secondary | ICD-10-CM | POA: Diagnosis not present

## 2018-05-06 DIAGNOSIS — M810 Age-related osteoporosis without current pathological fracture: Secondary | ICD-10-CM | POA: Diagnosis not present

## 2018-05-06 DIAGNOSIS — Z6825 Body mass index (BMI) 25.0-25.9, adult: Secondary | ICD-10-CM | POA: Diagnosis not present

## 2018-05-12 DIAGNOSIS — K5900 Constipation, unspecified: Secondary | ICD-10-CM | POA: Diagnosis not present

## 2018-05-12 DIAGNOSIS — I10 Essential (primary) hypertension: Secondary | ICD-10-CM | POA: Diagnosis not present

## 2018-05-12 DIAGNOSIS — M810 Age-related osteoporosis without current pathological fracture: Secondary | ICD-10-CM | POA: Diagnosis not present

## 2018-05-12 DIAGNOSIS — R0789 Other chest pain: Secondary | ICD-10-CM | POA: Diagnosis not present

## 2018-05-12 DIAGNOSIS — J449 Chronic obstructive pulmonary disease, unspecified: Secondary | ICD-10-CM | POA: Diagnosis not present

## 2018-06-02 ENCOUNTER — Other Ambulatory Visit: Payer: Self-pay | Admitting: Physician Assistant

## 2018-06-02 ENCOUNTER — Ambulatory Visit (INDEPENDENT_AMBULATORY_CARE_PROVIDER_SITE_OTHER): Payer: PPO

## 2018-06-02 ENCOUNTER — Encounter (INDEPENDENT_AMBULATORY_CARE_PROVIDER_SITE_OTHER): Payer: Self-pay | Admitting: Physician Assistant

## 2018-06-02 ENCOUNTER — Telehealth (INDEPENDENT_AMBULATORY_CARE_PROVIDER_SITE_OTHER): Payer: Self-pay | Admitting: Physician Assistant

## 2018-06-02 ENCOUNTER — Ambulatory Visit (INDEPENDENT_AMBULATORY_CARE_PROVIDER_SITE_OTHER): Payer: PPO | Admitting: Physician Assistant

## 2018-06-02 ENCOUNTER — Other Ambulatory Visit (INDEPENDENT_AMBULATORY_CARE_PROVIDER_SITE_OTHER): Payer: Self-pay | Admitting: Physician Assistant

## 2018-06-02 DIAGNOSIS — M545 Low back pain: Secondary | ICD-10-CM

## 2018-06-02 DIAGNOSIS — G8929 Other chronic pain: Secondary | ICD-10-CM

## 2018-06-02 MED ORDER — HYDROCODONE-ACETAMINOPHEN 5-325 MG PO TABS: 1.0000 | ORAL_TABLET | Freq: Four times a day (QID) | ORAL | 0 refills | Status: DC | PRN

## 2018-06-02 NOTE — Telephone Encounter (Signed)
See below

## 2018-06-02 NOTE — Telephone Encounter (Signed)
Patient called back stating that she has her MRI scheduled for Friday morning and wanted to know if Artis Delay was going to call her with the results or does she need to set up an appointment to get the results.  CB#[phone].  Thank you.

## 2018-06-02 NOTE — Progress Notes (Signed)
Office Visit Note   Patient: Nicole Preston           Date of Birth: 12-31-1933           MRN: [id number] Visit Date: 06/02/2018              Requested by: Haywood Pao, MD [address] [place], [place] [address] PCP: Haywood Pao, MD   Assessment & Plan: Visit Diagnoses:  1. Chronic bilateral low back pain, unspecified whether sciatica present     Plan: Due to her severe back pain and radiographic findings we will have her undergo an MRI of her lower thoracic spine to evaluate for T10 compression fracture and also for possible kyphoplasty planning.  Will arrange further treatment after the MRI.  Questions were encouraged and answered.  She is given Norco take mainly at night for pain.  Follow-Up Instructions: Return for After MRI.   Orders:  Orders Placed This Encounter  Procedures  . XR Lumbar Spine 2-3 Views  . MR Lumbar Spine w/o contrast   Meds ordered this encounter  Medications  . HYDROcodone-acetaminophen (NORCO) 5-325 MG tablet    Sig: Take 1 tablet by mouth every 6 (six) hours as needed for moderate pain. One to two tabs every 4-6 hours for pain    Dispense:  30 tablet    Refill:  0      Procedures: No procedures performed   Clinical Data: No additional findings.   Subjective: Chief Complaint  Patient presents with  . Spine - Pain    HPI Nicole Preston is well-known to Dr. Ninfa Linden service comes in today due to low back pain is been ongoing for at least the past 3 weeks.  No known injury she did do some exercises and wonders if she could have injured her back.  She notes that she had films at her primary care physician's office and was told that she had a T10 vertebral fracture.  Unfortunately the films are unavailable.  She has been using Tylenol for pain also placing heat on her back states none of this is helping.  States she is having severe pain that radiates out from the mid spine laterally both sides.  Denies any numbness tingling down  either leg.  No bowel or bladder dysfunction.  No saddle anesthesia like symptoms. Review of Systems See HPI  Objective: Vital Signs: There were no vitals taken for this visit.  Physical Exam General: Well-developed well-nourished female in no acute distress.  Mood and affect appropriate.  She ambulates slowly without any assistive device. Psych: Alert and oriented x3 Ortho Exam Lower extremity she has 5 out of 5 strength throughout lower extremities against resistance negative straight leg raise.  Sensation grossly intact bilateral feet.  Dorsal pedal pulses present.  She has tenderness over the lower thoracic vertebral column.  There is no rashes skin lesions on her back. Specialty Comments:  No specialty comments available.  Imaging: Xr Lumbar Spine 2-3 Views  Result Date: 06/02/2018 Lumbar AP and lateral views with a lateral view centered over the lower thoracic spine: Bone appears osteopenic.  There appears to be a compression fracture at T10 age indeterminate.  Otherwise no acute fractures.  Slight scoliosis of the spine.    PMFS History: Patient Active Problem List   Diagnosis Date Noted  . Benign essential HTN 04/05/2014  . Superficial injury of shoulder with infection 03/21/2014  . Colon polyp 01/02/2012  . Hemorrhage of rectum and anus 12/09/2011  .  Renal cell carcinoma ([place]) 12/09/2011   Past Medical History:  Diagnosis Date  . Arthritis   . COPD (chronic obstructive pulmonary disease) ([place])   . Hemorrhoids   . Hypertension   . Infection 02/2014   RT SHOULDER  . Osteopetrosis   . Osteoporosis   . Renal cancer ([place]) 4/12   rt nephrectomy  . S/p nephrectomy     Family History  Problem Relation Age of Onset  . Kidney cancer Mother   . Melanoma Father   . Lung cancer Brother   . Colon cancer Neg Hx     Past Surgical History:  Procedure Laterality Date  . ABDOMINAL HYSTERECTOMY    . ABDOMINAL SURGERY  2012   for renal carcinoma tumor  . BICEPT TENODESIS  Right 03/21/2014   Procedure: BICEPS TENODESIS;  Surgeon: Meredith Pel, MD;  Location: [facility];  Service: Orthopedics;  Laterality: Right;  . COLONOSCOPY  12/29/2011   Procedure: COLONOSCOPY;  Surgeon: Inda Castle, MD;  [facility];  Service: Endoscopy;  Laterality: N/A;  . EYE SURGERY Left    cataract surgery  . IRRIGATION AND DEBRIDEMENT SHOULDER Right 03/21/2014   Procedure: IRRIGATION AND DEBRIDEMENT SHOULDER, BICEPS TENODESIS;  Surgeon: Meredith Pel, MD;  Location: [facility];  Service: Orthopedics;  Laterality: Right;  I&D RIGHT SHOULDER WITH BICEPS TENODESIS  . NEPHRECTOMY  2012   RT KIDNEY   Social History   Occupational History  . Occupation: Retired   Tobacco Use  . Smoking status: Former Smoker    Packs/day: 1.00    Types: Cigarettes    Last attempt to quit: 12/29/1983    Years since quitting: 34.4  . Smokeless tobacco: Never Used  Substance and Sexual Activity  . Alcohol use: Yes    Alcohol/week: 14.0 standard drinks    Types: 14 Glasses of wine per week    Comment: 1-2 glasses of wine/day  . Drug use: No  . Sexual activity: Not on file

## 2018-06-03 ENCOUNTER — Ambulatory Visit: Payer: PPO | Admitting: Physician Assistant

## 2018-06-04 ENCOUNTER — Ambulatory Visit
Admission: RE | Admit: 2018-06-04 | Discharge: 2018-06-04 | Disposition: A | Discharge status: Home / Self Care | Payer: PPO | Source: Ambulatory Visit | Attending: Physician Assistant | Admitting: Physician Assistant

## 2018-06-04 ENCOUNTER — Other Ambulatory Visit (INDEPENDENT_AMBULATORY_CARE_PROVIDER_SITE_OTHER): Payer: Self-pay | Admitting: Radiology

## 2018-06-04 DIAGNOSIS — M545 Low back pain, unspecified: Secondary | ICD-10-CM

## 2018-06-04 DIAGNOSIS — G8929 Other chronic pain: Secondary | ICD-10-CM

## 2018-06-04 DIAGNOSIS — M549 Dorsalgia, unspecified: Secondary | ICD-10-CM

## 2018-06-07 ENCOUNTER — Telehealth (INDEPENDENT_AMBULATORY_CARE_PROVIDER_SITE_OTHER): Payer: Self-pay | Admitting: Physician Assistant

## 2018-06-07 ENCOUNTER — Other Ambulatory Visit (INDEPENDENT_AMBULATORY_CARE_PROVIDER_SITE_OTHER): Payer: Self-pay

## 2018-06-07 MED ORDER — DIAZEPAM 5 MG PO TABS: ORAL_TABLET | ORAL | 0 refills | Status: DC

## 2018-06-07 NOTE — Telephone Encounter (Signed)
Patient aware she is being scheduled for T-spine MRI and that I have called in Valium for her claustrophobia

## 2018-06-07 NOTE — Telephone Encounter (Signed)
Patient called and stated she could be called with her results so she cancelled appt. Per Ames Dura will can make the exception and call patient-cancelled appt  Please call patient with results

## 2018-06-09 ENCOUNTER — Telehealth (INDEPENDENT_AMBULATORY_CARE_PROVIDER_SITE_OTHER): Payer: Self-pay | Admitting: Orthopaedic Surgery

## 2018-06-09 ENCOUNTER — Ambulatory Visit (INDEPENDENT_AMBULATORY_CARE_PROVIDER_SITE_OTHER): Payer: PPO | Admitting: Physician Assistant

## 2018-06-09 NOTE — Telephone Encounter (Signed)
Patient is very worried about cost of two MRI's when she was only supposed to have one MRI

## 2018-06-09 NOTE — Telephone Encounter (Signed)
Patient left a message wanting to talk to you about her MRI, she has some questions.  CB#[phone].  Thank you

## 2018-06-10 ENCOUNTER — Ambulatory Visit (INDEPENDENT_AMBULATORY_CARE_PROVIDER_SITE_OTHER): Payer: PPO | Admitting: Physician Assistant

## 2018-06-14 NOTE — Telephone Encounter (Signed)
Chenise spoke with patient today letting her know it looks like PA is NOT required and to call [facility] to schedule

## 2018-06-14 NOTE — Telephone Encounter (Signed)
Patient left a message wanting to know if her insurance is going to cover the MRI.

## 2018-06-21 ENCOUNTER — Other Ambulatory Visit (INDEPENDENT_AMBULATORY_CARE_PROVIDER_SITE_OTHER): Payer: Self-pay | Admitting: Physician Assistant

## 2018-06-21 ENCOUNTER — Ambulatory Visit
Admission: RE | Admit: 2018-06-21 | Discharge: 2018-06-21 | Disposition: A | Discharge status: Home / Self Care | Payer: PPO | Source: Ambulatory Visit | Attending: Physician Assistant | Admitting: Physician Assistant

## 2018-06-21 DIAGNOSIS — M549 Dorsalgia, unspecified: Secondary | ICD-10-CM

## 2018-06-21 DIAGNOSIS — S22060A Wedge compression fracture of T7-T8 vertebra, initial encounter for closed fracture: Secondary | ICD-10-CM | POA: Diagnosis not present

## 2018-06-21 MED ORDER — HYDROCODONE-ACETAMINOPHEN 5-325 MG PO TABS: 1.0000 | ORAL_TABLET | Freq: Four times a day (QID) | ORAL | 0 refills | Status: AC | PRN

## 2018-06-22 ENCOUNTER — Telehealth (INDEPENDENT_AMBULATORY_CARE_PROVIDER_SITE_OTHER): Payer: Self-pay | Admitting: Physician Assistant

## 2018-06-22 ENCOUNTER — Other Ambulatory Visit (INDEPENDENT_AMBULATORY_CARE_PROVIDER_SITE_OTHER): Payer: Self-pay

## 2018-06-22 DIAGNOSIS — C799 Secondary malignant neoplasm of unspecified site: Secondary | ICD-10-CM

## 2018-06-22 NOTE — Telephone Encounter (Signed)
Patient called requesting a copy of the MRI results and also would like her medical records sent to Dr. Amalia Hailey, her Urologists.  She was not sure of the doctor's first name, but she said he has an office here in [place] [employer] and at [place].  [phone].  Thank you.

## 2018-06-22 NOTE — Telephone Encounter (Signed)
Can you send this info to her urologist please?

## 2018-06-24 ENCOUNTER — Encounter: Payer: Self-pay | Admitting: Oncology

## 2018-06-24 ENCOUNTER — Telehealth (INDEPENDENT_AMBULATORY_CARE_PROVIDER_SITE_OTHER): Payer: Self-pay

## 2018-06-24 ENCOUNTER — Telehealth: Payer: Self-pay | Admitting: Oncology

## 2018-06-24 NOTE — Telephone Encounter (Signed)
Done

## 2018-06-24 NOTE — Telephone Encounter (Signed)
New referral received from Dr. Ninfa Linden for metastatic disease. Pt has been scheduled to see Dr. Alen Blew on 1/7 at 11am. Pt aware to arrive 30 minutes early. Letter mailed.

## 2018-06-24 NOTE — Telephone Encounter (Signed)
Can you go into patient referral to her oncologist and change status to STAT please?

## 2018-06-28 ENCOUNTER — Ambulatory Visit (INDEPENDENT_AMBULATORY_CARE_PROVIDER_SITE_OTHER): Payer: PPO | Admitting: Orthopaedic Surgery

## 2018-06-28 NOTE — Telephone Encounter (Signed)
Patient picked up copy of MRI report and advised to sign release so we can fax report to Dr Amalia Hailey

## 2018-06-29 ENCOUNTER — Inpatient Hospital Stay: Payer: PPO | Attending: Oncology | Admitting: Oncology

## 2018-06-29 ENCOUNTER — Telehealth (INDEPENDENT_AMBULATORY_CARE_PROVIDER_SITE_OTHER): Payer: Self-pay | Admitting: Orthopaedic Surgery

## 2018-06-29 ENCOUNTER — Telehealth: Payer: Self-pay

## 2018-06-29 VITALS — BP 124/64 | HR 84 | Temp 97.5°F | Resp 18 | Ht 64.0 in | Wt 140.6 lb

## 2018-06-29 DIAGNOSIS — Z87891 Personal history of nicotine dependence: Secondary | ICD-10-CM | POA: Diagnosis not present

## 2018-06-29 DIAGNOSIS — I10 Essential (primary) hypertension: Secondary | ICD-10-CM | POA: Diagnosis not present

## 2018-06-29 DIAGNOSIS — C649 Malignant neoplasm of unspecified kidney, except renal pelvis: Secondary | ICD-10-CM | POA: Diagnosis not present

## 2018-06-29 DIAGNOSIS — M549 Dorsalgia, unspecified: Secondary | ICD-10-CM | POA: Diagnosis not present

## 2018-06-29 DIAGNOSIS — Z79899 Other long term (current) drug therapy: Secondary | ICD-10-CM | POA: Diagnosis not present

## 2018-06-29 DIAGNOSIS — Z90710 Acquired absence of both cervix and uterus: Secondary | ICD-10-CM | POA: Diagnosis not present

## 2018-06-29 DIAGNOSIS — Z905 Acquired absence of kidney: Secondary | ICD-10-CM | POA: Diagnosis not present

## 2018-06-29 MED ORDER — HYDROCODONE-ACETAMINOPHEN 5-325 MG PO TABS: 1.0000 | ORAL_TABLET | Freq: Four times a day (QID) | ORAL | 0 refills | Status: DC | PRN

## 2018-06-29 NOTE — Telephone Encounter (Signed)
Please advise 

## 2018-06-29 NOTE — Progress Notes (Signed)
Reason for the request: Renal cell carcinoma     HPI: I was asked by Dr. Ninfa Linden to evaluate Ms. Flenniken for abnormal imaging studies and history of renal cell carcinoma.  She is an 85 year old woman with history of hypertension, arthritis and osteoporosis.  She was diagnosed with renal cell carcinoma in 2012 and underwent a radical right nephrectomy performed by Dr. Amalia Hailey at [facility].  She has been following intermittently and imaging studies the most recent as of which was in July 2019 which showed no evidence of any disease.  She started developing back pain in the summer 2019.  She was initially evaluated with December 2019 and underwent MRI of the lumbar spine initially without contrast. The MRI showed mild compression fracture of T9 only partially included in the study and a follow-up thoracic MRI was suggested with contrast.  This was completed on 06/21/2018 which showed acute to subacute T7-T9 compression fracture that is worrisome for pathological fracture.  Mildly increased paravertebral T2 signal extending from T7-T9 is incompletely characterized and could reflect tumor or hematoma.  There is also an 8 mm lesion in the T3 vertebral body with metastasis not included.  Based on these findings she was referred to me for an evaluation.  Clinically, she continues to have back pain that is intermittent in nature and and disruptive to her sleep.  She has been using hydrocodone and Neurontin with improvement in her pain slightly.  She denies any neurological deficits or weakness.  Her appetite has slightly decreased related to her pain.  She denies any respiratory complaints or constitutional symptoms.  She does not report any headaches, blurry vision, syncope or seizures. Does not report any fevers, chills or sweats.  Does not report any cough, wheezing or hemoptysis.  Does not report any chest pain, palpitation, orthopnea or leg edema.  Does not report any nausea, vomiting  or abdominal pain.  Does not report any constipation or diarrhea.  Does not report any skeletal complaints.    Does not report frequency, urgency or hematuria.  Does not report any skin rashes or lesions. Does not report any heat or cold intolerance.  Does not report any lymphadenopathy or petechiae.  Does not report any anxiety or depression.  Remaining review of systems is negative.    Past Medical History:  Diagnosis Date  . Arthritis   . COPD (chronic obstructive pulmonary disease) ([place])   . Hemorrhoids   . Hypertension   . Infection 02/2014   RT SHOULDER  . Osteopetrosis   . Osteoporosis   . Renal cancer ([place]) 4/12   rt nephrectomy  . S/p nephrectomy   :  Past Surgical History:  Procedure Laterality Date  . ABDOMINAL HYSTERECTOMY    . ABDOMINAL SURGERY  2012   for renal carcinoma tumor  . BICEPT TENODESIS Right 03/21/2014   Procedure: BICEPS TENODESIS;  Surgeon: Meredith Pel, MD;  Location: [facility];  Service: Orthopedics;  Laterality: Right;  . COLONOSCOPY  12/29/2011   Procedure: COLONOSCOPY;  Surgeon: Inda Castle, MD;  Location: [facility];  Service: Endoscopy;  Laterality: N/A;  . EYE SURGERY Left    cataract surgery  . IRRIGATION AND DEBRIDEMENT SHOULDER Right 03/21/2014   Procedure: IRRIGATION AND DEBRIDEMENT SHOULDER, BICEPS TENODESIS;  Surgeon: Meredith Pel, MD;  Location: [employer];  Service: Orthopedics;  Laterality: Right;  I&D RIGHT SHOULDER WITH BICEPS TENODESIS  . NEPHRECTOMY  2012   RT KIDNEY  :   Current Outpatient Medications:  .  amLODipine (NORVASC) 10 MG tablet, Take 10 mg by mouth daily., Disp: , Rfl: 1 .  benazepril (LOTENSIN) 20 MG tablet, Take 20 mg by mouth daily., Disp: , Rfl:  .  cefTRIAXone 2 g in dextrose 5 % 50 mL, Inject 2 g into the vein daily., Disp: 2 g, Rfl: 30 .  cholecalciferol (VITAMIN D) 1000 UNITS tablet, Take 1,000 Units by mouth daily. , Disp: , Rfl:  .  diazepam (VALIUM) 5 MG tablet, Take one hour prior to MRI, repeat as  needed, Disp: 2 tablet, Rfl: 0 .  diphenhydrAMINE (BENADRYL) 25 MG tablet, Take 12.5 mg by mouth at bedtime as needed for sleep., Disp: , Rfl:  .  gabapentin (NEURONTIN) 100 MG capsule, TK 1 C PO UP TO TID, Disp: , Rfl: 2 .  LYSINE PO, Take 1 tablet by mouth daily., Disp: , Rfl:  .  ranitidine (ZANTAC) 150 MG tablet, Take 150 mg by mouth as needed for heartburn., Disp: , Rfl:  .  traMADol (ULTRAM) 50 MG tablet, Take by mouth., Disp: , Rfl: :  No Known Allergies:  Family History  Problem Relation Age of Onset  . Kidney cancer Mother   . Melanoma Father   . Lung cancer Brother   . Colon cancer Neg Hx   :  Social History   Socioeconomic History  . Marital status: Married    Spouse name: Not on file  . Number of children: 5  . Years of education: Not on file  . Highest education level: Not on file  Occupational History  . Occupation: Retired   Scientific laboratory technician  . Financial resource strain: Not on file  . Food insecurity:    Worry: Not on file    Inability: Not on file  . Transportation needs:    Medical: Not on file    Non-medical: Not on file  Tobacco Use  . Smoking status: Former Smoker    Packs/day: 1.00    Types: Cigarettes    Last attempt to quit: 12/29/1983    Years since quitting: 34.5  . Smokeless tobacco: Never Used  Substance and Sexual Activity  . Alcohol use: Yes    Alcohol/week: 14.0 standard drinks    Types: 14 Glasses of wine per week    Comment: 1-2 glasses of wine/day  . Drug use: No  . Sexual activity: Not on file  Lifestyle  . Physical activity:    Days per week: Not on file    Minutes per session: Not on file  . Stress: Not on file  Relationships  . Social connections:    Talks on phone: Not on file    Gets together: Not on file    Attends religious service: Not on file    Active member of club or organization: Not on file    Attends meetings of clubs or organizations: Not on file    Relationship status: Not on file  . Intimate partner violence:     Fear of current or ex partner: Not on file    Emotionally abused: Not on file    Physically abused: Not on file    Forced sexual activity: Not on file  Other Topics Concern  . Not on file  Social History Narrative   Daily caffeine   :  Pertinent items are noted in HPI.  Exam: Blood pressure 124/64, pulse 84, temperature (!) 97.5 F (36.4 C), temperature source Oral, resp. rate 18, height 5\' 4"  (1.626 m), weight 140 lb 9.6 oz (  63.8 kg), SpO2 94 %.    ECOG 1 General appearance: alert and cooperative appeared without distress. Head: atraumatic without any abnormalities. Eyes: conjunctivae/corneas clear. PERRL.  Sclera anicteric. Throat: lips, mucosa, and tongue normal; without oral thrush or ulcers. Resp: clear to auscultation bilaterally without rhonchi, wheezes or dullness to percussion. Cardio: regular rate and rhythm, S1, S2 normal, no murmur, click, rub or gallop GI: soft, non-tender; bowel sounds normal; no masses,  no organomegaly Skin: Skin color, texture, turgor normal. No rashes or lesions Lymph nodes: Cervical, supraclavicular, and axillary nodes normal. Neurologic: Grossly normal without any motor, sensory or deep tendon reflexes. Musculoskeletal: No joint deformity or effusion.  CBC    Component Value Date/Time   WBC 4.7 02/10/2018 0900   RBC 4.84 02/10/2018 0900   HGB 15.1 (H) 02/10/2018 0900   HCT 44.3 02/10/2018 0900   PLT 325.0 02/10/2018 0900   MCV 91.7 02/10/2018 0900   MCH 30.5 03/14/2014 1025   MCHC 34.1 02/10/2018 0900   RDW 14.0 02/10/2018 0900   LYMPHSABS 1.6 02/10/2018 0900   MONOABS 0.6 02/10/2018 0900   EOSABS 0.1 02/10/2018 0900   BASOSABS 0.1 02/10/2018 0900     Mr Thoracic Spine W/o Contrast  Result Date: 06/21/2018 CLINICAL DATA:  Severe back pain and spasms for the past 2 months. EXAM: MRI THORACIC SPINE WITHOUT CONTRAST TECHNIQUE: Multiplanar, multisequence MR imaging of the thoracic spine was performed. No intravenous contrast was  administered. COMPARISON:  MRI lumbar spine dated June 04, 2018. CT chest dated January 10, 2015. FINDINGS: Alignment:  Trace anterolisthesis at T2-T3. Vertebrae: Diffusely heterogeneous marrow signal. Mild T7 and T9 compression deformities with 35% height loss. Moderate T8 compression deformity with 60% height loss. Minimal T8 retropulsion. No preserved normal T1 marrow signal in the T9 vertebral body, and to a lesser extent the T8 vertebral body. No significant bulging of the posterior cortices. No abnormal signal extension into the posterior elements. No significant epidural hematoma. Round 8 mm T2 hyperintense, T1 hypointense lesion in the T3 vertebral body without surrounding marrow edema. Small hemangioma in the T6 vertebral body. Cord:  Normal signal and morphology. Paraspinal and other soft tissues: Mildly increased paravertebral T2 signal extending from T7-T9. Small bilateral pleural effusions. Prior right nephrectomy. Disc levels: Small left paracentral disc protrusion at T3-T4. No spinal canal or neuroforaminal stenosis at any level. IMPRESSION: 1. Acute to subacute T7 through T9 compression fractures. Lack of any preserved normal T1 marrow signal in the T9 and to a lesser extent T8 vertebral bodies is worrisome for pathologic fracture due to underlying metastatic disease given history of renal cell carcinoma. Chest CT would potentially be useful to evaluate vertebral body mineralization and any subtle osseous destruction. 2. Mildly increased paravertebral T2 signal extending from T7-T9 is incompletely characterized without contrast and could reflect tumor or hematoma. No epidural tumor or hematoma. 3. Indeterminate 8 mm lesion in the T3 vertebral body. Metastasis not excluded. Further evaluation could be obtained with PET-CT or bone scan. 4. Small bilateral pleural effusions. These results will be called to the ordering clinician or representative by the Radiologist Assistant, and communication  documented in the PACS or zVision Dashboard. Electronically Signed   By: Titus Dubin M.D.   On: 06/21/2018 10:40   Mr Lumbar Spine W/o Contrast  Result Date: 06/04/2018 CLINICAL DATA:  Chronic bilateral low back pain. History of renal cancer. Rule out fracture EXAM: MRI LUMBAR SPINE WITHOUT CONTRAST TECHNIQUE: Multiplanar, multisequence MR imaging of the lumbar spine was performed.  No intravenous contrast was administered. COMPARISON:  Radiographs 06/02/2018, CT abdomen pelvis 01/06/2018, lumbar MRI 08/23/2012 FINDINGS: Segmentation:  Normal Alignment:  Normal Vertebrae: Mild compression fracture of T9. T9 is only partially included on this study. The technologist extended the field-of-view to include the lower thoracic spine, as requested. No axial images through the T9 fracture. There is ill-defined edema in the vertebral body of T9. No definite epidural mass or cord compression Superior endplate deformity of L4 with Schmorl's node is chronic and unchanged. Compression fracture of L5 is chronic and unchanged from prior studies. Conus medullaris and cauda equina: Conus extends to the L1-2 level. Conus and cauda equina appear normal. Paraspinal and other soft tissues: Possible gallstones. No paraspinous mass or adenopathy. Disc levels: L1-2: Mild disc degeneration and disc bulging without stenosis. Mild facet degeneration. L2-3: Disc degeneration. Disc bulging and mild spurring. No significant stenosis. Mild facet degeneration. Small subligamentous synovial cyst on the left without compromise of the spinal canal. L3-4: Moderate disc degeneration right greater than left. Disc bulging and spurring. Bilateral facet hypertrophy. No focal disc protrusion or stenosis L4-5: Small left-sided annular tear and small disc protrusion with compression of the left L5 nerve root in the subarticular zone. This was not present previously. Bilateral facet degeneration and mild spinal stenosis. L5-S1: Moderate disc and facet  degeneration causing mild spinal stenosis and mild subarticular stenosis bilaterally. IMPRESSION: 1. Mild compression fracture T9, only partially included in the study. Based on limited imaging, this could represent a benign compression fracture however given history of renal cell carcinoma, pathologic fracture is possible. Follow-up MRI thoracic spine without with contrast suggested for further evaluation of this fracture. 2. Chronic fractures L4-L5 unchanged from prior studies 3. Multilevel degenerative changes as above. Small left-sided disc protrusion L4-5 with impingement left L5 nerve root. Electronically Signed   By: Franchot Gallo M.D.   On: 06/04/2018 07:02   Xr Lumbar Spine 2-3 Views  Result Date: 06/02/2018 Lumbar AP and lateral views with a lateral view centered over the lower thoracic spine: Bone appears osteopenic.  There appears to be a compression fracture at T10 age indeterminate.  Otherwise no acute fractures.  Slight scoliosis of the spine.   Assessment and Plan:    85 year old woman with the following:  1.  Compression fracture of the thoracic spine detected on June 21, 2018.  This appears to be acute or subacute to ranging from T7-T9 that could potentially be related to pathological etiology.  There is no clear-cut spinal metastasis noted of the rest of the spine.  The differential diagnosis of these findings were reviewed today with the patient and her family.  She does have history of renal cell carcinoma that was resected over 7 years out.  She had a CT scan of the chest abdomen and pelvis in July 2019 without any evidence of malignancy.  This could still represent pathological fracture from metastatic renal cell carcinoma to the thoracic spine.  Metastatic malignancy of other primary such as breast cancer could also be a possibility.  Metastatic cancer of unknown primary would also be a possibility.  Compression fraction related to non-malignant causes and related to  osteoporosis would also be on the differential.  To work this up completely, I will obtain a CT scan chest abdomen and pelvis for complete evaluation for any malignancy.  Ultimately, she will require tissue biopsy.  I feel that a kyphoplasty and a biopsy of her thoracic spine would be the next course of action to fully  evaluate these findings.  I will make the appropriate referral to interventional radiology for thoracic spine kyphoplasty with biopsy.  Depending on these findings, next course of action will be implemented.  If she has no malignant etiology for her compression fractures, then we are likely dealing with osteoporosis and she will require further management from that aspect.  If malignancy is detected, treatment would be with systemic therapy and possible radiation therapy if needed to.  2.  Back pain: She is currently on hydrocodone and Neurontin.  I did not refill any of these medication for her at this time.  I recommended that she obtain these medication from her primary care provider unless malignancy is documented.  3.  Follow-up: We will be in the next few weeks to follow her progress and assess the results of her imaging studies and potential bone biopsy.   60  minutes was spent with the patient face-to-face today.  More than 50% of time was dedicated to reviewing her imaging studies, differential diagnosis and management options..   Thank you for the referral. A copy of this consult has been forwarded to the requesting physician.

## 2018-06-29 NOTE — Telephone Encounter (Signed)
Printed avs and calender of upcoming appointment. Per 1/7 los Gave contrast and CT information

## 2018-06-29 NOTE — Telephone Encounter (Signed)
Patient called stated needed a refill hydrocodone.  Please call patient to advise  [phone]

## 2018-07-01 ENCOUNTER — Encounter (HOSPITAL_COMMUNITY): Payer: Self-pay

## 2018-07-01 ENCOUNTER — Other Ambulatory Visit: Payer: Self-pay | Admitting: Oncology

## 2018-07-01 ENCOUNTER — Ambulatory Visit (HOSPITAL_COMMUNITY)
Admission: RE | Admit: 2018-07-01 | Discharge: 2018-07-01 | Disposition: A | Discharge status: Home / Self Care | Payer: PPO | Source: Ambulatory Visit | Attending: Oncology | Admitting: Oncology

## 2018-07-01 ENCOUNTER — Inpatient Hospital Stay: Payer: PPO

## 2018-07-01 DIAGNOSIS — C641 Malignant neoplasm of right kidney, except renal pelvis: Secondary | ICD-10-CM | POA: Diagnosis not present

## 2018-07-01 DIAGNOSIS — R937 Abnormal findings on diagnostic imaging of other parts of musculoskeletal system: Secondary | ICD-10-CM | POA: Diagnosis not present

## 2018-07-01 DIAGNOSIS — C649 Malignant neoplasm of unspecified kidney, except renal pelvis: Secondary | ICD-10-CM

## 2018-07-01 DIAGNOSIS — N8189 Other female genital prolapse: Secondary | ICD-10-CM | POA: Diagnosis not present

## 2018-07-01 DIAGNOSIS — I700 Atherosclerosis of aorta: Secondary | ICD-10-CM | POA: Diagnosis not present

## 2018-07-01 DIAGNOSIS — Z905 Acquired absence of kidney: Secondary | ICD-10-CM | POA: Insufficient documentation

## 2018-07-01 DIAGNOSIS — Z79899 Other long term (current) drug therapy: Secondary | ICD-10-CM | POA: Diagnosis not present

## 2018-07-01 DIAGNOSIS — M549 Dorsalgia, unspecified: Secondary | ICD-10-CM | POA: Diagnosis not present

## 2018-07-01 DIAGNOSIS — I10 Essential (primary) hypertension: Secondary | ICD-10-CM | POA: Diagnosis not present

## 2018-07-01 DIAGNOSIS — Z90710 Acquired absence of both cervix and uterus: Secondary | ICD-10-CM | POA: Diagnosis not present

## 2018-07-01 DIAGNOSIS — Z87891 Personal history of nicotine dependence: Secondary | ICD-10-CM | POA: Diagnosis not present

## 2018-07-01 DIAGNOSIS — I2510 Atherosclerotic heart disease of native coronary artery without angina pectoris: Secondary | ICD-10-CM | POA: Insufficient documentation

## 2018-07-01 LAB — CBC WITH DIFFERENTIAL (CANCER CENTER ONLY)
Abs Immature Granulocytes: 0.03 10*3/uL (ref 0.00–0.07)
Basophils Absolute: 0.1 10*3/uL (ref 0.0–0.1)
Basophils Relative: 1 %
EOS ABS: 0.1 10*3/uL (ref 0.0–0.5)
Eosinophils Relative: 1 %
HCT: 42.6 % (ref 36.0–46.0)
Hemoglobin: 14.7 g/dL (ref 12.0–15.0)
Immature Granulocytes: 1 %
Lymphocytes Relative: 18 %
Lymphs Abs: 1.2 10*3/uL (ref 0.7–4.0)
MCH: 31 pg (ref 26.0–34.0)
MCHC: 34.5 g/dL (ref 30.0–36.0)
MCV: 89.9 fL (ref 80.0–100.0)
Monocytes Absolute: 0.5 10*3/uL (ref 0.1–1.0)
Monocytes Relative: 7 %
Neutro Abs: 4.8 10*3/uL (ref 1.7–7.7)
Neutrophils Relative %: 72 %
Platelet Count: 372 10*3/uL (ref 150–400)
RBC: 4.74 MIL/uL (ref 3.87–5.11)
RDW: 12.9 % (ref 11.5–15.5)
WBC Count: 6.6 10*3/uL (ref 4.0–10.5)
nRBC: 0 % (ref 0.0–0.2)

## 2018-07-01 LAB — CMP (CANCER CENTER ONLY)
ALK PHOS: 89 U/L (ref 38–126)
ALT: 10 U/L (ref 0–44)
AST: 17 U/L (ref 15–41)
Albumin: 4.5 g/dL (ref 3.5–5.0)
Anion gap: 11 (ref 5–15)
BUN: 13 mg/dL (ref 8–23)
CALCIUM: 10.4 mg/dL — AB (ref 8.9–10.3)
CO2: 23 mmol/L (ref 22–32)
CREATININE: 1.1 mg/dL — AB (ref 0.44–1.00)
Chloride: 96 mmol/L — ABNORMAL LOW (ref 98–111)
GFR, Est AFR Am: 54 mL/min — ABNORMAL LOW (ref >60)
GFR, Estimated: 47 mL/min — ABNORMAL LOW (ref >60)
Glucose, Bld: 99 mg/dL (ref 70–99)
Potassium: 4.8 mmol/L (ref 3.5–5.1)
Sodium: 130 mmol/L — ABNORMAL LOW (ref 135–145)
Total Bilirubin: 0.8 mg/dL (ref 0.3–1.2)
Total Protein: 8.1 g/dL (ref 6.5–8.1)

## 2018-07-01 MED ORDER — IOHEXOL 300 MG/ML  SOLN
100.0000 mL | Freq: Once | INTRAMUSCULAR | Status: AC | PRN
  Administered 2018-07-01: 100 mL via INTRAVENOUS

## 2018-07-01 MED ORDER — SODIUM CHLORIDE (PF) 0.9 % IJ SOLN
INTRAMUSCULAR | Status: AC
  Filled 2018-07-01: qty 50

## 2018-07-01 NOTE — Progress Notes (Signed)
The results of the CT scan obtained on July 01, 2017 were personally reviewed and discussed with the patient over the phone.  CT scan showed no evidence of malignancy or metastatic disease.  The scan shows evidence of compression fracture between T7 and T9 that likely represents osteopenia and osteoporosis.  This presentation is likely a result of osteoporosis rather than metastatic malignancy.  I have recommended proceeding with kyphoplasty and biopsy to be completed both for diagnostic and therapeutic purposes.  Interventional radiology referral has already been made.  Once the bone biopsy results are available the final conclusion will be reached at the time.

## 2018-07-02 ENCOUNTER — Ambulatory Visit: Payer: PPO | Admitting: Gastroenterology

## 2018-07-02 ENCOUNTER — Encounter

## 2018-07-05 ENCOUNTER — Telehealth: Payer: Self-pay

## 2018-07-05 ENCOUNTER — Other Ambulatory Visit (HOSPITAL_COMMUNITY): Payer: Self-pay | Admitting: Interventional Radiology

## 2018-07-05 DIAGNOSIS — S22060A Wedge compression fracture of T7-T8 vertebra, initial encounter for closed fracture: Secondary | ICD-10-CM

## 2018-07-05 DIAGNOSIS — S22070A Wedge compression fracture of T9-T10 vertebra, initial encounter for closed fracture: Secondary | ICD-10-CM

## 2018-07-05 LAB — MULTIPLE MYELOMA PANEL, SERUM
ALBUMIN/GLOB SERPL: 1.5 (ref 0.7–1.7)
ALPHA2 GLOB SERPL ELPH-MCNC: 0.8 g/dL (ref 0.4–1.0)
Albumin SerPl Elph-Mcnc: 4.3 g/dL (ref 2.9–4.4)
Alpha 1: 0.2 g/dL (ref 0.0–0.4)
B-Globulin SerPl Elph-Mcnc: 1 g/dL (ref 0.7–1.3)
Gamma Glob SerPl Elph-Mcnc: 1 g/dL (ref 0.4–1.8)
Globulin, Total: 3 g/dL (ref 2.2–3.9)
IGG (IMMUNOGLOBIN G), SERUM: 1187 mg/dL (ref 700–1600)
IgA: 223 mg/dL (ref 64–422)
IgM (Immunoglobulin M), Srm: 62 mg/dL (ref 26–217)
Total Protein ELP: 7.3 g/dL (ref 6.0–8.5)

## 2018-07-05 NOTE — Telephone Encounter (Signed)
Received call from patient requesting information about IR Kyphoplasty scheduling, as she has not heard anything. Contacted [facility], spoke with Caryl Pina, and she stated she just received authorization and will call patient to day to have scheduled.

## 2018-07-14 ENCOUNTER — Ambulatory Visit (HOSPITAL_COMMUNITY)
Admission: RE | Admit: 2018-07-14 | Discharge: 2018-07-14 | Disposition: A | Discharge status: Home / Self Care | Payer: PPO | Source: Ambulatory Visit | Attending: Interventional Radiology | Admitting: Interventional Radiology

## 2018-07-14 ENCOUNTER — Other Ambulatory Visit (HOSPITAL_COMMUNITY): Payer: Self-pay | Admitting: Interventional Radiology

## 2018-07-14 DIAGNOSIS — S22060A Wedge compression fracture of T7-T8 vertebra, initial encounter for closed fracture: Secondary | ICD-10-CM

## 2018-07-14 DIAGNOSIS — S22070A Wedge compression fracture of T9-T10 vertebra, initial encounter for closed fracture: Secondary | ICD-10-CM

## 2018-07-14 DIAGNOSIS — Z85528 Personal history of other malignant neoplasm of kidney: Secondary | ICD-10-CM | POA: Diagnosis not present

## 2018-07-14 NOTE — Consult Note (Signed)
Chief Complaint: Patient was seen in consultation today for T7, T8, and T9 compression fractures.  Referring Physician(s): Wyatt Portela  Supervising Physician: Luanne Bras  Patient Status: Nicole Preston - Out-pt  History of Present Illness: Nicole Preston is a 85 y.o. female with a past medical history of hypertension, COPD, hemorrhoids, renal cell carcinoma s/p right nephrectomy 09/2010, diabetes mellitus, arthritis, osteoporosis, osteopetrosis, and sleep apnea. In 04/2018, she began experiencing back pain. She went to orthopedics after 3 weeks of continued back pain and was seen by Erskine Emery, PA-C 06/02/2018 who ordered imaging scans for further evaluation.  MR lumbar spine 06/04/2018: 1. Mild compression fracture T9, only partially included in the study. Based on limited imaging, this could represent a benign compression fracture however given history of renal cell carcinoma, pathologic fracture is possible. Follow-up MRI thoracic spine without with contrast suggested for further evaluation of this fracture. 2. Chronic fractures L4-L5 unchanged from prior studies 3. Multilevel degenerative changes as above. Small left-sided disc protrusion L4-5 with impingement left L5 nerve root.  MR thoracic spine 06/21/2018: 1. Acute to subacute T7 through T9 compression fractures. Lack of any preserved normal T1 marrow signal in the T9 and to a lesser extent T8 vertebral bodies is worrisome for pathologic fracture due to underlying metastatic disease given history of renal cell carcinoma. Chest CT would potentially be useful to evaluate vertebral body mineralization and any subtle osseous destruction. 2. Mildly increased paravertebral T2 signal extending from T7-T9 is incompletely characterized without contrast and could reflect tumor or hematoma. No epidural tumor or hematoma. 3. Indeterminate 8 mm lesion in the T3 vertebral body. Metastasis not excluded. Further evaluation could be obtained with  PET-CT or bone scan. 4. Small bilateral pleural effusions.  These findings promoted oncology referral due to patient's history of renal cell carcinoma. She was seen by Dr. Alen Blew 06/29/2018 who recommended possible kyphoplasty/vertebroplasty with possible bone biopsy.  IR requested by Dr. Alen Blew for management of T7, T8, and T9 compression fractures. Patient awake and alert sitting in chair. Accompanied by husband. Complains of back pain, rated 5-6/10 at this time. States that her pain is a 9/10 when at its worst. States that moving aggregates pain. States she has pain radiation around her ribs. States she has tried Gabapentin and Hydrocodone with little relief of pain. Denies numbness/tingling down legs, urinary symptoms including dysuria and frequency, or bladder/bowel incontinence.   Past Medical History:  Diagnosis Date  . Arthritis   . COPD (chronic obstructive pulmonary disease) ([place])   . Hemorrhoids   . Hypertension   . Infection 02/2014   RT SHOULDER  . Osteopetrosis   . Osteoporosis   . Renal cancer ([place]) 4/12   rt nephrectomy  . S/p nephrectomy     Past Surgical History:  Procedure Laterality Date  . ABDOMINAL HYSTERECTOMY    . ABDOMINAL SURGERY  2012   for renal carcinoma tumor  . BICEPT TENODESIS Right 03/21/2014   Procedure: BICEPS TENODESIS;  Surgeon: Meredith Pel, MD;  Location: [facility];  Service: Orthopedics;  Laterality: Right;  . COLONOSCOPY  12/29/2011   Procedure: COLONOSCOPY;  Surgeon: Inda Castle, MD;  Location: [facility];  Service: Endoscopy;  Laterality: N/A;  . EYE SURGERY Left    cataract surgery  . IRRIGATION AND DEBRIDEMENT SHOULDER Right 03/21/2014   Procedure: IRRIGATION AND DEBRIDEMENT SHOULDER, BICEPS TENODESIS;  Surgeon: Meredith Pel, MD;  Location: [facility];  Service: Orthopedics;  Laterality: Right;  I&D RIGHT SHOULDER WITH BICEPS  TENODESIS  . NEPHRECTOMY  2012   RT KIDNEY    Allergies: Patient has no known  allergies.  Medications: Prior to Admission medications   Medication Sig Start Date End Date Taking? Authorizing Provider  benazepril (LOTENSIN) 20 MG tablet Take 20 mg by mouth daily.    [provider]  cefTRIAXone 2 g in dextrose 5 % 50 mL Inject 2 g into the vein daily. 03/22/14   Meredith Pel, MD  cholecalciferol (VITAMIN D) 1000 UNITS tablet Take 1,000 Units by mouth daily.     [provider]  diazepam (VALIUM) 5 MG tablet Take one hour prior to MRI, repeat as needed Patient not taking: Reported on 06/29/2018 06/07/18   Pete Pelt, PA-C  diphenhydrAMINE (BENADRYL) 25 MG tablet Take 12.5 mg by mouth at bedtime as needed for sleep.    [provider]  gabapentin (NEURONTIN) 100 MG capsule TK 1 C PO UP TO TID 05/26/18   [provider]  HYDROcodone-acetaminophen (NORCO/VICODIN) 5-325 MG tablet Take 1 tablet by mouth every 6 (six) hours as needed for moderate pain. 06/29/18   Mcarthur Rossetti, MD  LYSINE PO Take 1 tablet by mouth daily.    [provider]  ranitidine (ZANTAC) 150 MG tablet Take 150 mg by mouth as needed for heartburn.    [provider]  traMADol Veatrice Bourbon) 50 MG tablet Take by mouth. 05/02/16   [provider]     Family History  Problem Relation Age of Onset  . Kidney cancer Mother   . Melanoma Father   . Lung cancer Brother   . Colon cancer Neg Hx     Social History   Socioeconomic History  . Marital status: Married    Spouse name: Not on file  . Number of children: 5  . Years of education: Not on file  . Highest education level: Not on file  Occupational History  . Occupation: Retired   Scientific laboratory technician  . Financial resource strain: Not on file  . Food insecurity:    Worry: Not on file    Inability: Not on file  . Transportation needs:    Medical: Not on file    Non-medical: Not on file  Tobacco Use  . Smoking status: Former Smoker    Packs/day: 1.00    Types: Cigarettes     Last attempt to quit: 12/29/1983    Years since quitting: 34.5  . Smokeless tobacco: Never Used  Substance and Sexual Activity  . Alcohol use: Yes    Alcohol/week: 14.0 standard drinks    Types: 14 Glasses of wine per week    Comment: 1-2 glasses of wine/day  . Drug use: No  . Sexual activity: Not on file  Lifestyle  . Physical activity:    Days per week: Not on file    Minutes per session: Not on file  . Stress: Not on file  Relationships  . Social connections:    Talks on phone: Not on file    Gets together: Not on file    Attends religious service: Not on file    Active member of club or organization: Not on file    Attends meetings of clubs or organizations: Not on file    Relationship status: Not on file  Other Topics Concern  . Not on file  Social History Narrative   Daily caffeine      Review of Systems: A 12 point ROS discussed and pertinent positives are indicated in  the HPI above.  All other systems are negative.  Review of Systems  Constitutional: Negative for chills and fever.  Respiratory: Negative for shortness of breath and wheezing.   Cardiovascular: Negative for chest pain and palpitations.  Gastrointestinal:       Negative for bowel incontinence.  Genitourinary: Negative for dysuria and frequency.       Negative for bladder incontinence.  Musculoskeletal: Positive for back pain.  Neurological: Negative for numbness.  Psychiatric/Behavioral: Negative for behavioral problems and confusion.    Vital Signs: There were no vitals taken for this visit.  Physical Exam Constitutional:      General: She is not in acute distress.    Appearance: Normal appearance.  Pulmonary:     Effort: Pulmonary effort is normal. No respiratory distress.  Musculoskeletal:     Comments: Mild tenderness of midline spine at approximate T7/8/9 levels.  Skin:    General: Skin is warm and dry.  Neurological:     Mental Status: She is alert and oriented to person, place, and  time.  Psychiatric:        Mood and Affect: Mood normal.        Behavior: Behavior normal.        Thought Content: Thought content normal.        Judgment: Judgment normal.      Imaging: Ct Abdomen Pelvis W Wo Contrast  Result Date: 07/01/2018 CLINICAL DATA:  Right renal cell carcinoma diagnosed in 2012 with right nephrectomy. Upper abdominal pain and diarrhea. Follow-up of stage IV disease. EXAM: CT CHEST WITH CONTRAST CT ABDOMEN AND PELVIS WITH AND WITHOUT CONTRAST TECHNIQUE: Multidetector CT imaging of the chest was performed during intravenous contrast administration. Multidetector CT imaging of the abdomen and pelvis was performed following the standard protocol before and during bolus administration of intravenous contrast. CONTRAST:  133mL OMNIPAQUE IOHEXOL 300 MG/ML  SOLN COMPARISON:  Thoracic spine MRI MRI 06/04/2018. 06/21/2018 lumbar spine MRI. Abdominopelvic CT of 01/06/2018. Most recent chest CT 01/10/2015. FINDINGS: CT CHEST FINDINGS Cardiovascular: Aortic atherosclerosis. Tortuous thoracic aorta. Mild cardiomegaly, accentuated by a mild pectus excavatum deformity. Median sternotomy. Native coronary artery calcification including within the LAD and branches. No central pulmonary embolism, on this non-dedicated study. Mediastinum/Nodes: No supraclavicular adenopathy. No mediastinal or hilar adenopathy. Mildly dilated esophagus with subtle fluid level within, including image 45/11. The Lungs/Pleura: Trace left pleural fluid, decreased since the 06/21/2018 thoracic spine MRI. The right-sided pleural fluid has resolved. Mild centrilobular emphysema. Bibasilar scarring or subsegmental atelectasis. Clustered hyperdensities in the posterior left upper lobe are similar to 2016 and could relate to prior infection or aspiration of contrast. Musculoskeletal: Osteopenia. Mild T9, moderate T8, and moderate T7 compression deformities, without ventral canal encroachment. No underlying well-defined lytic  lesion. These compression fractures are new from 01/10/2015. CT ABDOMEN AND PELVIS FINDINGS Hepatobiliary: Normal liver. Normal gallbladder, without biliary ductal dilatation. Pancreas: Normal, without mass or ductal dilatation. Spleen: Old granulomatous disease in the spleen. Adrenals/Urinary Tract: Left adrenal thickening. Normal right adrenal gland. Right nephrectomy, without locally recurrent disease. Normal left kidney, without hydronephrosis. Suspect a small cystocele or urethral diverticulum, including on image 186/11. Stomach/Bowel: Normal stomach, without wall thickening. Normal colon, appendix, and terminal ileum. Normal small bowel. Vascular/Lymphatic: Aortic and branch vessel atherosclerosis. No abdominopelvic adenopathy. Reproductive: Hysterectomy. No adnexal mass. Prominent gonadal veins. Other: No significant free fluid.  Mild pelvic floor laxity. Musculoskeletal: Osteopenia. Mild superior endplate compression deformity is similar to 01/06/2018. IMPRESSION: 1. Status post right nephrectomy, without findings of  soft tissue metastasis. 2. T7-9 compression deformities, as detailed on thoracic spine MRI. Given absence of osseous metastasis elsewhere, and consecutive levels, favor insufficiency fractures secondary to osteopenia. Metastatic disease felt less likely. Consider follow-up with pre and postcontrast thoracic spine MRI at 3 months. 3. Coronary artery atherosclerosis. Aortic Atherosclerosis (ICD10-I70.0). 4. Pelvic floor laxity. Tiny cystocele versus urethral diverticulum. 5. Esophageal air fluid level suggests dysmotility or gastroesophageal reflux. 6. Prominent gonadal veins, as can be seen with pelvic congestion syndrome. Electronically Signed   By: Abigail Miyamoto M.D.   On: 07/01/2018 14:24   Ct Chest W Contrast  Result Date: 07/01/2018 CLINICAL DATA:  Right renal cell carcinoma diagnosed in 2012 with right nephrectomy. Upper abdominal pain and diarrhea. Follow-up of stage IV disease. EXAM: CT  CHEST WITH CONTRAST CT ABDOMEN AND PELVIS WITH AND WITHOUT CONTRAST TECHNIQUE: Multidetector CT imaging of the chest was performed during intravenous contrast administration. Multidetector CT imaging of the abdomen and pelvis was performed following the standard protocol before and during bolus administration of intravenous contrast. CONTRAST:  18mL OMNIPAQUE IOHEXOL 300 MG/ML  SOLN COMPARISON:  Thoracic spine MRI MRI 06/04/2018. 06/21/2018 lumbar spine MRI. Abdominopelvic CT of 01/06/2018. Most recent chest CT 01/10/2015. FINDINGS: CT CHEST FINDINGS Cardiovascular: Aortic atherosclerosis. Tortuous thoracic aorta. Mild cardiomegaly, accentuated by a mild pectus excavatum deformity. Median sternotomy. Native coronary artery calcification including within the LAD and branches. No central pulmonary embolism, on this non-dedicated study. Mediastinum/Nodes: No supraclavicular adenopathy. No mediastinal or hilar adenopathy. Mildly dilated esophagus with subtle fluid level within, including image 45/11. The Lungs/Pleura: Trace left pleural fluid, decreased since the 06/21/2018 thoracic spine MRI. The right-sided pleural fluid has resolved. Mild centrilobular emphysema. Bibasilar scarring or subsegmental atelectasis. Clustered hyperdensities in the posterior left upper lobe are similar to 2016 and could relate to prior infection or aspiration of contrast. Musculoskeletal: Osteopenia. Mild T9, moderate T8, and moderate T7 compression deformities, without ventral canal encroachment. No underlying well-defined lytic lesion. These compression fractures are new from 01/10/2015. CT ABDOMEN AND PELVIS FINDINGS Hepatobiliary: Normal liver. Normal gallbladder, without biliary ductal dilatation. Pancreas: Normal, without mass or ductal dilatation. Spleen: Old granulomatous disease in the spleen. Adrenals/Urinary Tract: Left adrenal thickening. Normal right adrenal gland. Right nephrectomy, without locally recurrent disease. Normal  left kidney, without hydronephrosis. Suspect a small cystocele or urethral diverticulum, including on image 186/11. Stomach/Bowel: Normal stomach, without wall thickening. Normal colon, appendix, and terminal ileum. Normal small bowel. Vascular/Lymphatic: Aortic and branch vessel atherosclerosis. No abdominopelvic adenopathy. Reproductive: Hysterectomy. No adnexal mass. Prominent gonadal veins. Other: No significant free fluid.  Mild pelvic floor laxity. Musculoskeletal: Osteopenia. Mild superior endplate compression deformity is similar to 01/06/2018. IMPRESSION: 1. Status post right nephrectomy, without findings of soft tissue metastasis. 2. T7-9 compression deformities, as detailed on thoracic spine MRI. Given absence of osseous metastasis elsewhere, and consecutive levels, favor insufficiency fractures secondary to osteopenia. Metastatic disease felt less likely. Consider follow-up with pre and postcontrast thoracic spine MRI at 3 months. 3. Coronary artery atherosclerosis. Aortic Atherosclerosis (ICD10-I70.0). 4. Pelvic floor laxity. Tiny cystocele versus urethral diverticulum. 5. Esophageal air fluid level suggests dysmotility or gastroesophageal reflux. 6. Prominent gonadal veins, as can be seen with pelvic congestion syndrome. Electronically Signed   By: Abigail Miyamoto M.D.   On: 07/01/2018 14:24   Mr Thoracic Spine W/o Contrast  Result Date: 06/21/2018 CLINICAL DATA:  Severe back pain and spasms for the past 2 months. EXAM: MRI THORACIC SPINE WITHOUT CONTRAST TECHNIQUE: Multiplanar, multisequence MR imaging of the thoracic  spine was performed. No intravenous contrast was administered. COMPARISON:  MRI lumbar spine dated June 04, 2018. CT chest dated January 10, 2015. FINDINGS: Alignment:  Trace anterolisthesis at T2-T3. Vertebrae: Diffusely heterogeneous marrow signal. Mild T7 and T9 compression deformities with 35% height loss. Moderate T8 compression deformity with 60% height loss. Minimal T8  retropulsion. No preserved normal T1 marrow signal in the T9 vertebral body, and to a lesser extent the T8 vertebral body. No significant bulging of the posterior cortices. No abnormal signal extension into the posterior elements. No significant epidural hematoma. Round 8 mm T2 hyperintense, T1 hypointense lesion in the T3 vertebral body without surrounding marrow edema. Small hemangioma in the T6 vertebral body. Cord:  Normal signal and morphology. Paraspinal and other soft tissues: Mildly increased paravertebral T2 signal extending from T7-T9. Small bilateral pleural effusions. Prior right nephrectomy. Disc levels: Small left paracentral disc protrusion at T3-T4. No spinal canal or neuroforaminal stenosis at any level. IMPRESSION: 1. Acute to subacute T7 through T9 compression fractures. Lack of any preserved normal T1 marrow signal in the T9 and to a lesser extent T8 vertebral bodies is worrisome for pathologic fracture due to underlying metastatic disease given history of renal cell carcinoma. Chest CT would potentially be useful to evaluate vertebral body mineralization and any subtle osseous destruction. 2. Mildly increased paravertebral T2 signal extending from T7-T9 is incompletely characterized without contrast and could reflect tumor or hematoma. No epidural tumor or hematoma. 3. Indeterminate 8 mm lesion in the T3 vertebral body. Metastasis not excluded. Further evaluation could be obtained with PET-CT or bone scan. 4. Small bilateral pleural effusions. These results will be called to the ordering clinician or representative by the Radiologist Assistant, and communication documented in the PACS or zVision Dashboard. Electronically Signed   By: Titus Dubin M.D.   On: 06/21/2018 10:40    Labs:  CBC: Recent Labs    02/10/18 0900 07/01/18 1146  WBC 4.7 6.6  HGB 15.1* 14.7  HCT 44.3 42.6  PLT 325.0 372    COAGS: No results for input(s): INR, APTT in the last 8760 hours.  BMP: Recent  Labs    02/10/18 0900 07/01/18 1146  NA 134* 130*  K 3.8 4.8  CL 100 96*  CO2 22 23  GLUCOSE 69* 99  BUN 13 13  CALCIUM 10.2 10.4*  CREATININE 1.09 1.10*  GFRNONAA  --  47*  GFRAA  --  54*    LIVER FUNCTION TESTS: Recent Labs    02/10/18 0900 07/01/18 1146  BILITOT 1.0 0.8  AST 15 17  ALT 10 10  ALKPHOS 56 89  PROT 7.7 8.1  ALBUMIN 4.6 4.5    TUMOR MARKERS: No results for input(s): AFPTM, CEA, CA199, CHROMGRNA in the last 8760 hours.  Assessment and Plan:  T7, T8, and T9 compression fractures. Reviewed imaging with patient and husband. First brought to their attention was a lesion in patients T3 vertebral body. Explained that this vertebral body level is too high for biopsy, as it has an increased risk of complications and low pathology yield. Next brought to their attention was patient's T7/8/9 compression fractures. Recommended that intervention on these fractures will be a staged procedure, so that we can get results of the biopsy before proceeding. Explained that due to patient's history of RCC, it is recommended that patient has biopsies at each of these levels to differentiate between malignancy and osteoporosis fractures. Recommended that we wait for biopsy results before proceeding- if biopsy results are  malignant we can do an ablation and KP/VP at each level; if biopsy results are not malignant we can do a KP/VP at each level. If we were to do biopsies and KP/VP in same day and the biopsy result is malignant, than we will have missed our opportunity to do an ablation. Patient expresses desire to move forward with staged intervention, beginning with biopsies at T7/8/9 levels.  Plan for follow-up with an image-guided T7/8/9 bone biopsies tentatively for 07/19/2018 at 1130 am with Dr. Estanislado Pandy. Handout with procedure information given to patient.  All questions answered and concerns addressed. Patient and husband convey understanding and agree with plan.  Thank you  for this interesting consult.  I greatly enjoyed meeting ASIYA CUTBIRTH and look forward to participating in their care.  A copy of this report was sent to the requesting provider on this date.  Electronically Signed: Earley Abide, PA-C 07/14/2018, 8:18 AM   I spent a total of 40 Minutes in face to face in clinical consultation, greater than 50% of which was counseling/coordinating care for T7, T8, and T9 compression fractures.

## 2018-07-16 ENCOUNTER — Other Ambulatory Visit: Payer: Self-pay | Admitting: Radiology

## 2018-07-19 ENCOUNTER — Ambulatory Visit (HOSPITAL_COMMUNITY)
Admission: RE | Admit: 2018-07-19 | Discharge: 2018-07-19 | Disposition: A | Discharge status: Home / Self Care | Payer: PPO | Source: Ambulatory Visit | Attending: Interventional Radiology | Admitting: Interventional Radiology

## 2018-07-19 ENCOUNTER — Other Ambulatory Visit: Payer: Self-pay

## 2018-07-19 ENCOUNTER — Encounter (HOSPITAL_COMMUNITY): Payer: Self-pay

## 2018-07-19 DIAGNOSIS — Z801 Family history of malignant neoplasm of trachea, bronchus and lung: Secondary | ICD-10-CM | POA: Diagnosis not present

## 2018-07-19 DIAGNOSIS — Z87891 Personal history of nicotine dependence: Secondary | ICD-10-CM | POA: Insufficient documentation

## 2018-07-19 DIAGNOSIS — I10 Essential (primary) hypertension: Secondary | ICD-10-CM | POA: Insufficient documentation

## 2018-07-19 DIAGNOSIS — Z79899 Other long term (current) drug therapy: Secondary | ICD-10-CM | POA: Diagnosis not present

## 2018-07-19 DIAGNOSIS — G4733 Obstructive sleep apnea (adult) (pediatric): Secondary | ICD-10-CM | POA: Diagnosis not present

## 2018-07-19 DIAGNOSIS — S22060A Wedge compression fracture of T7-T8 vertebra, initial encounter for closed fracture: Secondary | ICD-10-CM | POA: Diagnosis not present

## 2018-07-19 DIAGNOSIS — S22070A Wedge compression fracture of T9-T10 vertebra, initial encounter for closed fracture: Secondary | ICD-10-CM

## 2018-07-19 DIAGNOSIS — Z807 Family history of other malignant neoplasms of lymphoid, hematopoietic and related tissues: Secondary | ICD-10-CM | POA: Insufficient documentation

## 2018-07-19 DIAGNOSIS — E119 Type 2 diabetes mellitus without complications: Secondary | ICD-10-CM | POA: Diagnosis not present

## 2018-07-19 DIAGNOSIS — Z808 Family history of malignant neoplasm of other organs or systems: Secondary | ICD-10-CM | POA: Diagnosis not present

## 2018-07-19 DIAGNOSIS — Z8051 Family history of malignant neoplasm of kidney: Secondary | ICD-10-CM | POA: Diagnosis not present

## 2018-07-19 DIAGNOSIS — M899 Disorder of bone, unspecified: Secondary | ICD-10-CM | POA: Insufficient documentation

## 2018-07-19 DIAGNOSIS — M4854XA Collapsed vertebra, not elsewhere classified, thoracic region, initial encounter for fracture: Secondary | ICD-10-CM | POA: Diagnosis not present

## 2018-07-19 DIAGNOSIS — X58XXXA Exposure to other specified factors, initial encounter: Secondary | ICD-10-CM | POA: Diagnosis not present

## 2018-07-19 DIAGNOSIS — Z905 Acquired absence of kidney: Secondary | ICD-10-CM | POA: Diagnosis not present

## 2018-07-19 DIAGNOSIS — Z90710 Acquired absence of both cervix and uterus: Secondary | ICD-10-CM | POA: Insufficient documentation

## 2018-07-19 DIAGNOSIS — M810 Age-related osteoporosis without current pathological fracture: Secondary | ICD-10-CM | POA: Diagnosis not present

## 2018-07-19 DIAGNOSIS — C649 Malignant neoplasm of unspecified kidney, except renal pelvis: Secondary | ICD-10-CM

## 2018-07-19 DIAGNOSIS — M898X8 Other specified disorders of bone, other site: Secondary | ICD-10-CM | POA: Diagnosis not present

## 2018-07-19 DIAGNOSIS — Z85528 Personal history of other malignant neoplasm of kidney: Secondary | ICD-10-CM | POA: Diagnosis not present

## 2018-07-19 HISTORY — PX: IR FLUORO GUIDED NEEDLE PLC ASPIRATION/INJECTION LOC: IMG2395

## 2018-07-19 LAB — PROTIME-INR
INR: 1.05
Prothrombin Time: 13.6 seconds (ref 11.4–15.2)

## 2018-07-19 LAB — CBC
HCT: 44.5 % (ref 36.0–46.0)
Hemoglobin: 14.7 g/dL (ref 12.0–15.0)
MCH: 30.1 pg (ref 26.0–34.0)
MCHC: 33 g/dL (ref 30.0–36.0)
MCV: 91 fL (ref 80.0–100.0)
Platelets: 339 10*3/uL (ref 150–400)
RBC: 4.89 MIL/uL (ref 3.87–5.11)
RDW: 13.1 % (ref 11.5–15.5)
WBC: 5.2 10*3/uL (ref 4.0–10.5)
nRBC: 0 % (ref 0.0–0.2)

## 2018-07-19 MED ORDER — BUPIVACAINE HCL (PF) 0.5 % IJ SOLN
INTRAMUSCULAR | Status: AC
  Filled 2018-07-19: qty 30

## 2018-07-19 MED ORDER — MIDAZOLAM HCL 2 MG/2ML IJ SOLN
INTRAMUSCULAR | Status: AC
  Filled 2018-07-19: qty 2

## 2018-07-19 MED ORDER — KETOROLAC TROMETHAMINE 30 MG/ML IJ SOLN
INTRAMUSCULAR | Status: AC
  Filled 2018-07-19: qty 1

## 2018-07-19 MED ORDER — KETOROLAC TROMETHAMINE 30 MG/ML IJ SOLN
INTRAMUSCULAR | Status: AC | PRN
  Administered 2018-07-19: 30 mg via INTRAVENOUS

## 2018-07-19 MED ORDER — FENTANYL CITRATE (PF) 100 MCG/2ML IJ SOLN
INTRAMUSCULAR | Status: AC
  Filled 2018-07-19: qty 2

## 2018-07-19 MED ORDER — SODIUM CHLORIDE 0.9 % IV SOLN: INTRAVENOUS | Status: DC

## 2018-07-19 MED ORDER — SODIUM CHLORIDE 0.9 % IV SOLN
INTRAVENOUS | Status: AC | PRN
  Administered 2018-07-19: 10 mL/h via INTRAVENOUS

## 2018-07-19 MED ORDER — MIDAZOLAM HCL 2 MG/2ML IJ SOLN
INTRAMUSCULAR | Status: AC | PRN
  Administered 2018-07-19 (×3): 1 mg via INTRAVENOUS

## 2018-07-19 MED ORDER — FENTANYL CITRATE (PF) 100 MCG/2ML IJ SOLN
INTRAMUSCULAR | Status: AC | PRN
  Administered 2018-07-19 (×3): 25 ug via INTRAVENOUS

## 2018-07-19 MED ORDER — SODIUM CHLORIDE 0.9 % IV SOLN: INTRAVENOUS | Status: AC

## 2018-07-19 MED ORDER — BUPIVACAINE HCL (PF) 0.5 % IJ SOLN
INTRAMUSCULAR | Status: AC | PRN
  Administered 2018-07-19: 30 mL

## 2018-07-19 MED ORDER — KETOROLAC TROMETHAMINE 30 MG/ML IJ SOLN: 30.0000 mg | INTRAMUSCULAR | Status: DC

## 2018-07-19 NOTE — Procedures (Signed)
S/P  T7,T8 and T9 core biopsies under fluoroscopy.

## 2018-07-19 NOTE — H&P (Signed)
Chief Complaint: Patient was seen in consultation today for Thoracic 7,8,9 bone biopsy at the request Dr Dahlia Byes  Supervising Physician: Luanne Bras  Patient Status: [facility] - Out-pt  History of Present Illness: Nicole Preston is a 85 y.o. female   Pt with Hx Renal cell carcinoma Rt nephrectomy 2012 HTN; COPD; DM; OSA Has had worsening back [pain x 3 months  Imaging revealed T7,8,9 fractures with suspicion for metastatic etiology  Referred to Dr Estanislado Pandy for evaluation and possible treatment Note 1/22: Recommended that we wait for biopsy results before proceeding- if biopsy results are malignant we can do an ablation and KP/VP at each level; if biopsy results are not malignant we can do a KP/VP at each level. If we were to do biopsies and KP/VP in same day and the biopsy result is malignant, than we will have missed our opportunity to do an ablation. Patient expresses desire to move forward with staged intervention, beginning with biopsies at T7/8/9 levels.  Scheduled now for T7-8-9 biopsy Staged treatment dependent on results   Past Medical History:  Diagnosis Date  . Arthritis   . COPD (chronic obstructive pulmonary disease) ([place])   . Hemorrhoids   . Hypertension   . Infection 02/2014   RT SHOULDER  . Osteopetrosis   . Osteoporosis   . Renal cancer ([place]) 4/12   rt nephrectomy  . S/p nephrectomy     Past Surgical History:  Procedure Laterality Date  . ABDOMINAL HYSTERECTOMY    . ABDOMINAL SURGERY  2012   for renal carcinoma tumor  . BICEPT TENODESIS Right 03/21/2014   Procedure: BICEPS TENODESIS;  Surgeon: Meredith Pel, MD;  Location: [place];  Service: Orthopedics;  Laterality: Right;  . COLONOSCOPY  12/29/2011   Procedure: COLONOSCOPY;  Surgeon: Inda Castle, MD;  Location: [facility];  Service: Endoscopy;  Laterality: N/A;  . EYE SURGERY Left    cataract surgery  . IRRIGATION AND DEBRIDEMENT SHOULDER Right 03/21/2014   Procedure: IRRIGATION AND  DEBRIDEMENT SHOULDER, BICEPS TENODESIS;  Surgeon: Meredith Pel, MD;  Location: [place];  Service: Orthopedics;  Laterality: Right;  I&D RIGHT SHOULDER WITH BICEPS TENODESIS  . NEPHRECTOMY  2012   RT KIDNEY    Allergies: Patient has no known allergies.  Medications: Prior to Admission medications   Medication Sig Start Date End Date Taking? Authorizing Provider  benazepril (LOTENSIN) 20 MG tablet Take 20 mg by mouth daily.   Yes [provider]  cholecalciferol (VITAMIN D) 1000 UNITS tablet Take 1,000 Units by mouth daily.    Yes [provider]  famotidine-calcium carbonate-magnesium hydroxide (PEPCID COMPLETE) 10-800-165 MG chewable tablet Chew 1 tablet by mouth daily as needed (heartburn).   Yes [provider]  gabapentin (NEURONTIN) 100 MG capsule Take 300 mg by mouth at bedtime.  05/26/18  Yes [provider]  HYDROcodone-acetaminophen (NORCO/VICODIN) 5-325 MG tablet Take 1 tablet by mouth every 6 (six) hours as needed for moderate pain. 06/29/18  Yes Mcarthur Rossetti, MD  LYSINE PO Take 1 tablet by mouth daily as needed (mouth sores).    Yes [provider]     Family History  Problem Relation Age of Onset  . Kidney cancer Mother   . Melanoma Father   . Lung cancer Brother   . Colon cancer Neg Hx     Social History   Socioeconomic History  . Marital status: Married    Spouse name: Not on file  . Number of children:  5  . Years of education: Not on file  . Highest education level: Not on file  Occupational History  . Occupation: Retired   Scientific laboratory technician  . Financial resource strain: Not on file  . Food insecurity:    Worry: Not on file    Inability: Not on file  . Transportation needs:    Medical: Not on file    Non-medical: Not on file  Tobacco Use  . Smoking status: Former Smoker    Packs/day: 1.00    Types: Cigarettes    Last attempt to quit: 12/29/1983    Years since quitting: 34.5  . Smokeless tobacco: Never  Used  Substance and Sexual Activity  . Alcohol use: Yes    Alcohol/week: 14.0 standard drinks    Types: 14 Glasses of wine per week    Comment: 1-2 glasses of wine/day  . Drug use: No  . Sexual activity: Not on file  Lifestyle  . Physical activity:    Days per week: Not on file    Minutes per session: Not on file  . Stress: Not on file  Relationships  . Social connections:    Talks on phone: Not on file    Gets together: Not on file    Attends religious service: Not on file    Active member of club or organization: Not on file    Attends meetings of clubs or organizations: Not on file    Relationship status: Not on file  Other Topics Concern  . Not on file  Social History Narrative   Daily caffeine    Review of Systems: A 12 point ROS discussed and pertinent positives are indicated in the HPI above.  All other systems are negative.  Review of Systems  Constitutional: Positive for activity change. Negative for appetite change, fatigue and fever.  Respiratory: Negative for cough and shortness of breath.   Cardiovascular: Negative for chest pain.  Gastrointestinal: Negative for abdominal pain.  Musculoskeletal: Positive for back pain and gait problem.  Psychiatric/Behavioral: Negative for behavioral problems and confusion.    Vital Signs: Pulse 75   Temp 97.7 F (36.5 C) (Oral)   Ht 5\' 4"  (1.626 m)   Wt 140 lb (63.5 kg)   SpO2 97%   BMI 24.03 kg/m   Physical Exam Vitals signs reviewed.  Constitutional:      Appearance: Normal appearance.  Cardiovascular:     Rate and Rhythm: Normal rate and regular rhythm.     Heart sounds: Normal heart sounds.  Pulmonary:     Effort: Pulmonary effort is normal.     Breath sounds: Normal breath sounds.  Abdominal:     General: Bowel sounds are normal.     Palpations: Abdomen is soft.  Musculoskeletal: Normal range of motion.     Comments: Upper back pain  Skin:    General: Skin is warm and dry.  Neurological:     General:  No focal deficit present.     Mental Status: She is oriented to person, place, and time.  Psychiatric:        Mood and Affect: Mood normal.        Behavior: Behavior normal.        Thought Content: Thought content normal.        Judgment: Judgment normal.     Imaging: Ct Abdomen Pelvis W Wo Contrast  Result Date: 07/01/2018 CLINICAL DATA:  Right renal cell carcinoma diagnosed in 2012 with right nephrectomy. Upper abdominal pain and diarrhea.  Follow-up of stage IV disease. EXAM: CT CHEST WITH CONTRAST CT ABDOMEN AND PELVIS WITH AND WITHOUT CONTRAST TECHNIQUE: Multidetector CT imaging of the chest was performed during intravenous contrast administration. Multidetector CT imaging of the abdomen and pelvis was performed following the standard protocol before and during bolus administration of intravenous contrast. CONTRAST:  173mL OMNIPAQUE IOHEXOL 300 MG/ML  SOLN COMPARISON:  Thoracic spine MRI MRI 06/04/2018. 06/21/2018 lumbar spine MRI. Abdominopelvic CT of 01/06/2018. Most recent chest CT 01/10/2015. FINDINGS: CT CHEST FINDINGS Cardiovascular: Aortic atherosclerosis. Tortuous thoracic aorta. Mild cardiomegaly, accentuated by a mild pectus excavatum deformity. Median sternotomy. Native coronary artery calcification including within the LAD and branches. No central pulmonary embolism, on this non-dedicated study. Mediastinum/Nodes: No supraclavicular adenopathy. No mediastinal or hilar adenopathy. Mildly dilated esophagus with subtle fluid level within, including image 45/11. The Lungs/Pleura: Trace left pleural fluid, decreased since the 06/21/2018 thoracic spine MRI. The right-sided pleural fluid has resolved. Mild centrilobular emphysema. Bibasilar scarring or subsegmental atelectasis. Clustered hyperdensities in the posterior left upper lobe are similar to 2016 and could relate to prior infection or aspiration of contrast. Musculoskeletal: Osteopenia. Mild T9, moderate T8, and moderate T7 compression  deformities, without ventral canal encroachment. No underlying well-defined lytic lesion. These compression fractures are new from 01/10/2015. CT ABDOMEN AND PELVIS FINDINGS Hepatobiliary: Normal liver. Normal gallbladder, without biliary ductal dilatation. Pancreas: Normal, without mass or ductal dilatation. Spleen: Old granulomatous disease in the spleen. Adrenals/Urinary Tract: Left adrenal thickening. Normal right adrenal gland. Right nephrectomy, without locally recurrent disease. Normal left kidney, without hydronephrosis. Suspect a small cystocele or urethral diverticulum, including on image 186/11. Stomach/Bowel: Normal stomach, without wall thickening. Normal colon, appendix, and terminal ileum. Normal small bowel. Vascular/Lymphatic: Aortic and branch vessel atherosclerosis. No abdominopelvic adenopathy. Reproductive: Hysterectomy. No adnexal mass. Prominent gonadal veins. Other: No significant free fluid.  Mild pelvic floor laxity. Musculoskeletal: Osteopenia. Mild superior endplate compression deformity is similar to 01/06/2018. IMPRESSION: 1. Status post right nephrectomy, without findings of soft tissue metastasis. 2. T7-9 compression deformities, as detailed on thoracic spine MRI. Given absence of osseous metastasis elsewhere, and consecutive levels, favor insufficiency fractures secondary to osteopenia. Metastatic disease felt less likely. Consider follow-up with pre and postcontrast thoracic spine MRI at 3 months. 3. Coronary artery atherosclerosis. Aortic Atherosclerosis (ICD10-I70.0). 4. Pelvic floor laxity. Tiny cystocele versus urethral diverticulum. 5. Esophageal air fluid level suggests dysmotility or gastroesophageal reflux. 6. Prominent gonadal veins, as can be seen with pelvic congestion syndrome. Electronically Signed   By: Abigail Miyamoto M.D.   On: 07/01/2018 14:24   Ct Chest W Contrast  Result Date: 07/01/2018 CLINICAL DATA:  Right renal cell carcinoma diagnosed in 2012 with right  nephrectomy. Upper abdominal pain and diarrhea. Follow-up of stage IV disease. EXAM: CT CHEST WITH CONTRAST CT ABDOMEN AND PELVIS WITH AND WITHOUT CONTRAST TECHNIQUE: Multidetector CT imaging of the chest was performed during intravenous contrast administration. Multidetector CT imaging of the abdomen and pelvis was performed following the standard protocol before and during bolus administration of intravenous contrast. CONTRAST:  159mL OMNIPAQUE IOHEXOL 300 MG/ML  SOLN COMPARISON:  Thoracic spine MRI MRI 06/04/2018. 06/21/2018 lumbar spine MRI. Abdominopelvic CT of 01/06/2018. Most recent chest CT 01/10/2015. FINDINGS: CT CHEST FINDINGS Cardiovascular: Aortic atherosclerosis. Tortuous thoracic aorta. Mild cardiomegaly, accentuated by a mild pectus excavatum deformity. Median sternotomy. Native coronary artery calcification including within the LAD and branches. No central pulmonary embolism, on this non-dedicated study. Mediastinum/Nodes: No supraclavicular adenopathy. No mediastinal or hilar adenopathy. Mildly dilated esophagus with subtle  fluid level within, including image 45/11. The Lungs/Pleura: Trace left pleural fluid, decreased since the 06/21/2018 thoracic spine MRI. The right-sided pleural fluid has resolved. Mild centrilobular emphysema. Bibasilar scarring or subsegmental atelectasis. Clustered hyperdensities in the posterior left upper lobe are similar to 2016 and could relate to prior infection or aspiration of contrast. Musculoskeletal: Osteopenia. Mild T9, moderate T8, and moderate T7 compression deformities, without ventral canal encroachment. No underlying well-defined lytic lesion. These compression fractures are new from 01/10/2015. CT ABDOMEN AND PELVIS FINDINGS Hepatobiliary: Normal liver. Normal gallbladder, without biliary ductal dilatation. Pancreas: Normal, without mass or ductal dilatation. Spleen: Old granulomatous disease in the spleen. Adrenals/Urinary Tract: Left adrenal thickening.  Normal right adrenal gland. Right nephrectomy, without locally recurrent disease. Normal left kidney, without hydronephrosis. Suspect a small cystocele or urethral diverticulum, including on image 186/11. Stomach/Bowel: Normal stomach, without wall thickening. Normal colon, appendix, and terminal ileum. Normal small bowel. Vascular/Lymphatic: Aortic and branch vessel atherosclerosis. No abdominopelvic adenopathy. Reproductive: Hysterectomy. No adnexal mass. Prominent gonadal veins. Other: No significant free fluid.  Mild pelvic floor laxity. Musculoskeletal: Osteopenia. Mild superior endplate compression deformity is similar to 01/06/2018. IMPRESSION: 1. Status post right nephrectomy, without findings of soft tissue metastasis. 2. T7-9 compression deformities, as detailed on thoracic spine MRI. Given absence of osseous metastasis elsewhere, and consecutive levels, favor insufficiency fractures secondary to osteopenia. Metastatic disease felt less likely. Consider follow-up with pre and postcontrast thoracic spine MRI at 3 months. 3. Coronary artery atherosclerosis. Aortic Atherosclerosis (ICD10-I70.0). 4. Pelvic floor laxity. Tiny cystocele versus urethral diverticulum. 5. Esophageal air fluid level suggests dysmotility or gastroesophageal reflux. 6. Prominent gonadal veins, as can be seen with pelvic congestion syndrome. Electronically Signed   By: Abigail Miyamoto M.D.   On: 07/01/2018 14:24   Mr Thoracic Spine W/o Contrast  Result Date: 06/21/2018 CLINICAL DATA:  Severe back pain and spasms for the past 2 months. EXAM: MRI THORACIC SPINE WITHOUT CONTRAST TECHNIQUE: Multiplanar, multisequence MR imaging of the thoracic spine was performed. No intravenous contrast was administered. COMPARISON:  MRI lumbar spine dated June 04, 2018. CT chest dated January 10, 2015. FINDINGS: Alignment:  Trace anterolisthesis at T2-T3. Vertebrae: Diffusely heterogeneous marrow signal. Mild T7 and T9 compression deformities with 35%  height loss. Moderate T8 compression deformity with 60% height loss. Minimal T8 retropulsion. No preserved normal T1 marrow signal in the T9 vertebral body, and to a lesser extent the T8 vertebral body. No significant bulging of the posterior cortices. No abnormal signal extension into the posterior elements. No significant epidural hematoma. Round 8 mm T2 hyperintense, T1 hypointense lesion in the T3 vertebral body without surrounding marrow edema. Small hemangioma in the T6 vertebral body. Cord:  Normal signal and morphology. Paraspinal and other soft tissues: Mildly increased paravertebral T2 signal extending from T7-T9. Small bilateral pleural effusions. Prior right nephrectomy. Disc levels: Small left paracentral disc protrusion at T3-T4. No spinal canal or neuroforaminal stenosis at any level. IMPRESSION: 1. Acute to subacute T7 through T9 compression fractures. Lack of any preserved normal T1 marrow signal in the T9 and to a lesser extent T8 vertebral bodies is worrisome for pathologic fracture due to underlying metastatic disease given history of renal cell carcinoma. Chest CT would potentially be useful to evaluate vertebral body mineralization and any subtle osseous destruction. 2. Mildly increased paravertebral T2 signal extending from T7-T9 is incompletely characterized without contrast and could reflect tumor or hematoma. No epidural tumor or hematoma. 3. Indeterminate 8 mm lesion in the T3 vertebral body. Metastasis  not excluded. Further evaluation could be obtained with PET-CT or bone scan. 4. Small bilateral pleural effusions. These results will be called to the ordering clinician or representative by the Radiologist Assistant, and communication documented in the PACS or zVision Dashboard. Electronically Signed   By: Titus Dubin M.D.   On: 06/21/2018 10:40    Labs:  CBC: Recent Labs    02/10/18 0900 07/01/18 1146  WBC 4.7 6.6  HGB 15.1* 14.7  HCT 44.3 42.6  PLT 325.0 372     COAGS: No results for input(s): INR, APTT in the last 8760 hours.  BMP: Recent Labs    02/10/18 0900 07/01/18 1146  NA 134* 130*  K 3.8 4.8  CL 100 96*  CO2 22 23  GLUCOSE 69* 99  BUN 13 13  CALCIUM 10.2 10.4*  CREATININE 1.09 1.10*  GFRNONAA  --  47*  GFRAA  --  54*    LIVER FUNCTION TESTS: Recent Labs    02/10/18 0900 07/01/18 1146  BILITOT 1.0 0.8  AST 15 17  ALT 10 10  ALKPHOS 56 89  PROT 7.7 8.1  ALBUMIN 4.6 4.5    TUMOR MARKERS: No results for input(s): AFPTM, CEA, CA199, CHROMGRNA in the last 8760 hours.  Assessment and Plan:  Hx Renal cell carcinoma Thoracic 7-8-9 fractures-- worrisome for metastatic disease Scheduled now for biopsy of each bone Will plan treatment dependent on results Risks and benefits discussed with the patient including, but not limited to bleeding, infection, damage to adjacent structures or low yield requiring additional tests.  All of the patient's questions were answered, patient is agreeable to proceed. Consent signed and in chart.   Thank you for this interesting consult.  I greatly enjoyed meeting RANESSA KOSTA and look forward to participating in their care.  A copy of this report was sent to the requesting provider on this date.  Electronically Signed: Lavonia Drafts, PA-C 07/19/2018, 10:59 AM   I spent a total of    25 Minutes in face to face in clinical consultation, greater than 50% of which was counseling/coordinating care for Thoracic 7/8/9 biopsy

## 2018-07-19 NOTE — Discharge Instructions (Signed)
Needle Biopsy, Care After °These instructions tell you how to care for yourself after your procedure. Your doctor may also give you more specific instructions. Call your doctor if you have any problems or questions. °What can I expect after the procedure? °After the procedure, it is common to have: °· Soreness. °· Bruising. °· Mild pain. °Follow these instructions at home: ° °· Return to your normal activities as told by your doctor. Ask your doctor what activities are safe for you. °· Take over-the-counter and prescription medicines only as told by your doctor. °· Wash your hands with soap and water before you change your bandage (dressing). If you cannot use soap and water, use hand sanitizer. °· Follow instructions from your doctor about: °? How to take care of your puncture site. °? When and how to change your bandage. °? When to remove your bandage. °· Check your puncture site every day for signs of infection. Watch for: °? Redness, swelling, or pain. °? Fluid or blood.  °? Pus or a bad smell. °? Warmth. °· Do not take baths, swim, or use a hot tub until your doctor approves. Ask your doctor if you may take showers. You may only be allowed to take sponge baths. °· Keep all follow-up visits as told by your doctor. This is important. °Contact a doctor if you have: °· A fever. °· Redness, swelling, or pain at the puncture site, and it lasts longer than a few days. °· Fluid, blood, or pus coming from the puncture site. °· Warmth coming from the puncture site. °Get help right away if: °· You have a lot of bleeding from the puncture site. °Summary °· After the procedure, it is common to have soreness, bruising, or mild pain at the puncture site. °· Check your puncture site every day for signs of infection, such as redness, swelling, or pain. °· Get help right away if you have severe bleeding from your puncture site. °This information is not intended to replace advice given to you by your health care provider. Make  sure you discuss any questions you have with your health care provider. °Document Released: 05/22/2008 Document Revised: 06/22/2017 Document Reviewed: 06/22/2017 °Elsevier Interactive Patient Education © 2019 [employer] ° °

## 2018-07-20 ENCOUNTER — Ambulatory Visit: Payer: PPO | Admitting: Oncology

## 2018-07-21 ENCOUNTER — Telehealth (HOSPITAL_COMMUNITY): Payer: Self-pay | Admitting: Radiology

## 2018-07-21 ENCOUNTER — Encounter (HOSPITAL_COMMUNITY): Payer: Self-pay | Admitting: Interventional Radiology

## 2018-07-21 NOTE — Telephone Encounter (Signed)
Pt called and wanted to know about removing the bandages from her back after her procedure on 07/19/18 as well as when she could shower. Per Radonna Ricker, PA the patient can remove bandages after 24 hours and also may shower 24 hours after procedure. She should wait to submerge in bath until 07/22/18. Called pt and gave her this information. She agreed and states understanding. Would like her biopsy results ASAP.  JM

## 2018-07-22 ENCOUNTER — Telehealth: Payer: Self-pay | Admitting: *Deleted

## 2018-07-23 ENCOUNTER — Telehealth: Payer: Self-pay | Admitting: *Deleted

## 2018-07-23 NOTE — Telephone Encounter (Signed)
Patient calling to ask if 07/27/18 appt with dr Alen Blew is necessary?  States she was informed by dr Estanislado Pandy that bx showed no cancer.

## 2018-07-23 NOTE — Telephone Encounter (Signed)
Spoke with patient, per dr Alen Blew, she may cancel appt with him on 07/27/2018

## 2018-07-23 NOTE — Telephone Encounter (Signed)
She can cancel

## 2018-07-26 ENCOUNTER — Other Ambulatory Visit (HOSPITAL_COMMUNITY): Payer: Self-pay | Admitting: Interventional Radiology

## 2018-07-26 DIAGNOSIS — S22060A Wedge compression fracture of T7-T8 vertebra, initial encounter for closed fracture: Secondary | ICD-10-CM

## 2018-07-26 DIAGNOSIS — S22070A Wedge compression fracture of T9-T10 vertebra, initial encounter for closed fracture: Secondary | ICD-10-CM

## 2018-07-27 ENCOUNTER — Ambulatory Visit: Payer: PPO | Admitting: Oncology

## 2018-07-28 ENCOUNTER — Other Ambulatory Visit: Payer: Self-pay | Admitting: Radiology

## 2018-07-29 ENCOUNTER — Other Ambulatory Visit (HOSPITAL_COMMUNITY): Payer: Self-pay | Admitting: Interventional Radiology

## 2018-07-29 ENCOUNTER — Ambulatory Visit (HOSPITAL_COMMUNITY)
Admission: RE | Admit: 2018-07-29 | Discharge: 2018-07-29 | Disposition: A | Discharge status: Home / Self Care | Payer: PPO | Source: Ambulatory Visit | Attending: Interventional Radiology | Admitting: Interventional Radiology

## 2018-07-29 ENCOUNTER — Encounter (HOSPITAL_COMMUNITY): Payer: Self-pay

## 2018-07-29 ENCOUNTER — Ambulatory Visit (HOSPITAL_COMMUNITY): Admission: RE | Admit: 2018-07-29 | Payer: PPO | Source: Ambulatory Visit

## 2018-07-29 DIAGNOSIS — I10 Essential (primary) hypertension: Secondary | ICD-10-CM | POA: Insufficient documentation

## 2018-07-29 DIAGNOSIS — S22060A Wedge compression fracture of T7-T8 vertebra, initial encounter for closed fracture: Secondary | ICD-10-CM

## 2018-07-29 DIAGNOSIS — Z79899 Other long term (current) drug therapy: Secondary | ICD-10-CM | POA: Diagnosis not present

## 2018-07-29 DIAGNOSIS — J449 Chronic obstructive pulmonary disease, unspecified: Secondary | ICD-10-CM | POA: Diagnosis not present

## 2018-07-29 DIAGNOSIS — E119 Type 2 diabetes mellitus without complications: Secondary | ICD-10-CM | POA: Insufficient documentation

## 2018-07-29 DIAGNOSIS — Z87891 Personal history of nicotine dependence: Secondary | ICD-10-CM | POA: Diagnosis not present

## 2018-07-29 DIAGNOSIS — Z85528 Personal history of other malignant neoplasm of kidney: Secondary | ICD-10-CM | POA: Insufficient documentation

## 2018-07-29 DIAGNOSIS — S22070A Wedge compression fracture of T9-T10 vertebra, initial encounter for closed fracture: Secondary | ICD-10-CM

## 2018-07-29 DIAGNOSIS — M810 Age-related osteoporosis without current pathological fracture: Secondary | ICD-10-CM | POA: Insufficient documentation

## 2018-07-29 DIAGNOSIS — M4854XA Collapsed vertebra, not elsewhere classified, thoracic region, initial encounter for fracture: Secondary | ICD-10-CM | POA: Diagnosis not present

## 2018-07-29 DIAGNOSIS — G4733 Obstructive sleep apnea (adult) (pediatric): Secondary | ICD-10-CM | POA: Diagnosis not present

## 2018-07-29 DIAGNOSIS — Z905 Acquired absence of kidney: Secondary | ICD-10-CM | POA: Diagnosis not present

## 2018-07-29 DIAGNOSIS — X58XXXA Exposure to other specified factors, initial encounter: Secondary | ICD-10-CM | POA: Insufficient documentation

## 2018-07-29 HISTORY — PX: IR KYPHO EA ADDL LEVEL THORACIC OR LUMBAR: IMG5520

## 2018-07-29 HISTORY — PX: IR KYPHO THORACIC WITH BONE BIOPSY: IMG5518

## 2018-07-29 LAB — BASIC METABOLIC PANEL
Anion gap: 12 (ref 5–15)
BUN: 8 mg/dL (ref 8–23)
CALCIUM: 9.8 mg/dL (ref 8.9–10.3)
CO2: 22 mmol/L (ref 22–32)
Chloride: 99 mmol/L (ref 98–111)
Creatinine, Ser: 1.08 mg/dL — ABNORMAL HIGH (ref 0.44–1.00)
GFR calc Af Amer: 55 mL/min — ABNORMAL LOW (ref >60)
GFR, EST NON AFRICAN AMERICAN: 48 mL/min — AB (ref >60)
Glucose, Bld: 82 mg/dL (ref 70–99)
Potassium: 3.9 mmol/L (ref 3.5–5.1)
Sodium: 133 mmol/L — ABNORMAL LOW (ref 135–145)

## 2018-07-29 LAB — CBC
HCT: 45 % (ref 36.0–46.0)
Hemoglobin: 15 g/dL (ref 12.0–15.0)
MCH: 30.2 pg (ref 26.0–34.0)
MCHC: 33.3 g/dL (ref 30.0–36.0)
MCV: 90.5 fL (ref 80.0–100.0)
Platelets: 362 10*3/uL (ref 150–400)
RBC: 4.97 MIL/uL (ref 3.87–5.11)
RDW: 13 % (ref 11.5–15.5)
WBC: 5.7 10*3/uL (ref 4.0–10.5)
nRBC: 0 % (ref 0.0–0.2)

## 2018-07-29 LAB — PROTIME-INR
INR: 1.09
PROTHROMBIN TIME: 14 s (ref 11.4–15.2)

## 2018-07-29 LAB — URINALYSIS, COMPLETE (UACMP) WITH MICROSCOPIC
Bacteria, UA: NONE SEEN
Bilirubin Urine: NEGATIVE
Glucose, UA: NEGATIVE mg/dL
Hgb urine dipstick: NEGATIVE
Ketones, ur: NEGATIVE mg/dL
Nitrite: NEGATIVE
Protein, ur: NEGATIVE mg/dL
SPECIFIC GRAVITY, URINE: 1.003 — AB (ref 1.005–1.030)
pH: 8 (ref 5.0–8.0)

## 2018-07-29 MED ORDER — FENTANYL CITRATE (PF) 100 MCG/2ML IJ SOLN
INTRAMUSCULAR | Status: AC
  Filled 2018-07-29: qty 2

## 2018-07-29 MED ORDER — MIDAZOLAM HCL 2 MG/2ML IJ SOLN
INTRAMUSCULAR | Status: AC
  Filled 2018-07-29: qty 2

## 2018-07-29 MED ORDER — CEFAZOLIN SODIUM-DEXTROSE 2-4 GM/100ML-% IV SOLN: 2.0000 g | INTRAVENOUS | Status: DC

## 2018-07-29 MED ORDER — HYDRALAZINE HCL 20 MG/ML IJ SOLN
INTRAMUSCULAR | Status: AC
  Filled 2018-07-29: qty 1

## 2018-07-29 MED ORDER — HYDRALAZINE HCL 20 MG/ML IJ SOLN
INTRAMUSCULAR | Status: AC | PRN
  Administered 2018-07-29: 5 mg via INTRAVENOUS

## 2018-07-29 MED ORDER — TOBRAMYCIN SULFATE 1.2 G IJ SOLR
INTRAMUSCULAR | Status: AC
  Filled 2018-07-29: qty 1.2

## 2018-07-29 MED ORDER — FENTANYL CITRATE (PF) 100 MCG/2ML IJ SOLN
INTRAMUSCULAR | Status: AC | PRN
  Administered 2018-07-29 (×4): 25 ug via INTRAVENOUS

## 2018-07-29 MED ORDER — HYDROMORPHONE HCL 1 MG/ML IJ SOLN
INTRAMUSCULAR | Status: AC
  Filled 2018-07-29: qty 1

## 2018-07-29 MED ORDER — SODIUM CHLORIDE 0.9 % IV SOLN: INTRAVENOUS | Status: AC

## 2018-07-29 MED ORDER — BUPIVACAINE HCL (PF) 0.5 % IJ SOLN
INTRAMUSCULAR | Status: AC
  Filled 2018-07-29: qty 60

## 2018-07-29 MED ORDER — IOPAMIDOL (ISOVUE-300) INJECTION 61%
INTRAVENOUS | Status: AC
  Administered 2018-07-29: 15 mL
  Filled 2018-07-29: qty 50

## 2018-07-29 MED ORDER — CEFAZOLIN (ANCEF) 1 G IV SOLR
INTRAVENOUS | Status: AC | PRN
  Administered 2018-07-29: 2 g

## 2018-07-29 MED ORDER — MIDAZOLAM HCL 2 MG/2ML IJ SOLN
INTRAMUSCULAR | Status: AC | PRN
  Administered 2018-07-29 (×4): 1 mg via INTRAVENOUS

## 2018-07-29 MED ORDER — SODIUM CHLORIDE 0.9 % IV SOLN: INTRAVENOUS | Status: DC

## 2018-07-29 MED ORDER — CEFAZOLIN SODIUM-DEXTROSE 2-4 GM/100ML-% IV SOLN
INTRAVENOUS | Status: AC
  Filled 2018-07-29: qty 100

## 2018-07-29 MED ORDER — BUPIVACAINE HCL (PF) 0.5 % IJ SOLN
INTRAMUSCULAR | Status: AC | PRN
  Administered 2018-07-29: 45 mL

## 2018-07-29 MED ORDER — GELATIN ABSORBABLE 12-7 MM EX MISC
CUTANEOUS | Status: AC
  Filled 2018-07-29: qty 1

## 2018-07-29 NOTE — H&P (Signed)
Chief Complaint: Patient was seen in consultation today for Thoracic 7,8,9 bone biopsy at the request Dr Dahlia Byes  Supervising Physician: Luanne Bras  Patient Status: [facility] - Out-pt  History of Present Illness: Nicole Preston is a 85 y.o. female   Pt with Hx Renal cell carcinoma Rt nephrectomy 2012 HTN; COPD; DM; OSA Has had worsening back [pain x 3 months  Imaging revealed T7,8,9 fractures with suspicion for metastatic etiology  Referred to Dr Estanislado Pandy for evaluation and possible treatment Patient desired to move forward with staged intervention, beginning with biopsies at T7/8/9 levels. She underwent bx of each level on 1/27, Pathology was negative. She remains symptomatic and is now scheduled for VP/KP. No ablation needed. PMHx, meds, labs, imaging, allergies reviewed. Feels well, no recent fevers, chills, illness. Has been NPO today as directed. Family at bedside.     Past Medical History:  Diagnosis Date  . Arthritis   . COPD (chronic obstructive pulmonary disease) ([place])   . Hemorrhoids   . Hypertension   . Infection 02/2014   RT SHOULDER  . Osteopetrosis   . Osteoporosis   . Renal cancer ([place]) 4/12   rt nephrectomy  . S/p nephrectomy     Past Surgical History:  Procedure Laterality Date  . ABDOMINAL HYSTERECTOMY    . ABDOMINAL SURGERY  2012   for renal carcinoma tumor  . BICEPT TENODESIS Right 03/21/2014   Procedure: BICEPS TENODESIS;  Surgeon: Meredith Pel, MD;  Location: [facility];  Service: Orthopedics;  Laterality: Right;  . COLONOSCOPY  12/29/2011   Procedure: COLONOSCOPY;  Surgeon: Inda Castle, MD;  Location: [facility];  Service: Endoscopy;  Laterality: N/A;  . EYE SURGERY Left    cataract surgery  . IR FLUORO GUIDED NEEDLE PLC ASPIRATION/INJECTION LOC  07/19/2018  . IR FLUORO GUIDED NEEDLE PLC ASPIRATION/INJECTION LOC  07/19/2018  . IR FLUORO GUIDED NEEDLE PLC ASPIRATION/INJECTION LOC  07/19/2018  . IRRIGATION AND DEBRIDEMENT  SHOULDER Right 03/21/2014   Procedure: IRRIGATION AND DEBRIDEMENT SHOULDER, BICEPS TENODESIS;  Surgeon: Meredith Pel, MD;  Location: [place];  Service: Orthopedics;  Laterality: Right;  I&D RIGHT SHOULDER WITH BICEPS TENODESIS  . NEPHRECTOMY  2012   RT KIDNEY    Allergies: Patient has no known allergies.  Medications: Prior to Admission medications   Medication Sig Start Date End Date Taking? Authorizing Provider  benazepril (LOTENSIN) 20 MG tablet Take 20 mg by mouth daily.   Yes [provider]  cholecalciferol (VITAMIN D) 1000 UNITS tablet Take 1,000 Units by mouth daily.    Yes [provider]  famotidine-calcium carbonate-magnesium hydroxide (PEPCID COMPLETE) 10-800-165 MG chewable tablet Chew 1 tablet by mouth daily as needed (heartburn).   Yes [provider]  gabapentin (NEURONTIN) 100 MG capsule Take 300 mg by mouth at bedtime.  05/26/18  Yes [provider]  HYDROcodone-acetaminophen (NORCO/VICODIN) 5-325 MG tablet Take 1 tablet by mouth every 6 (six) hours as needed for moderate pain. 06/29/18  Yes Mcarthur Rossetti, MD  LYSINE PO Take 1 tablet by mouth daily as needed (mouth sores).    Yes [provider]     Family History  Problem Relation Age of Onset  . Kidney cancer Mother   . Melanoma Father   . Lung cancer Brother   . Colon cancer Neg Hx     Social History   Socioeconomic History  . Marital status: Married    Spouse name: Not on file  . Number of children:  5  . Years of education: Not on file  . Highest education level: Not on file  Occupational History  . Occupation: Retired   Scientific laboratory technician  . Financial resource strain: Not on file  . Food insecurity:    Worry: Not on file    Inability: Not on file  . Transportation needs:    Medical: Not on file    Non-medical: Not on file  Tobacco Use  . Smoking status: Former Smoker    Packs/day: 1.00    Types: Cigarettes    Last attempt to quit: 12/29/1983     Years since quitting: 34.6  . Smokeless tobacco: Never Used  Substance and Sexual Activity  . Alcohol use: Yes    Alcohol/week: 14.0 standard drinks    Types: 14 Glasses of wine per week    Comment: 1-2 glasses of wine/day  . Drug use: No  . Sexual activity: Not on file  Lifestyle  . Physical activity:    Days per week: Not on file    Minutes per session: Not on file  . Stress: Not on file  Relationships  . Social connections:    Talks on phone: Not on file    Gets together: Not on file    Attends religious service: Not on file    Active member of club or organization: Not on file    Attends meetings of clubs or organizations: Not on file    Relationship status: Not on file  Other Topics Concern  . Not on file  Social History Narrative   Daily caffeine    Review of Systems: A 12 point ROS discussed and pertinent positives are indicated in the HPI above.  All other systems are negative.  Review of Systems  Constitutional: Positive for activity change. Negative for appetite change, fatigue and fever.  Respiratory: Negative for cough and shortness of breath.   Cardiovascular: Negative for chest pain.  Gastrointestinal: Negative for abdominal pain.  Musculoskeletal: Positive for back pain and gait problem.  Psychiatric/Behavioral: Negative for behavioral problems and confusion.    Vital Signs: BP (!) 152/60   Pulse 65   Temp 97.6 F (36.4 C) (Oral)   Resp 16   SpO2 98%   Physical Exam Vitals signs reviewed.  Constitutional:      Appearance: Normal appearance.  Cardiovascular:     Rate and Rhythm: Normal rate and regular rhythm.     Heart sounds: Normal heart sounds.  Pulmonary:     Effort: Pulmonary effort is normal.     Breath sounds: Normal breath sounds.  Abdominal:     General: Bowel sounds are normal.     Palpations: Abdomen is soft.  Musculoskeletal: Normal range of motion.     Comments: Upper back pain  Skin:    General: Skin is warm and dry.    Neurological:     General: No focal deficit present.     Mental Status: She is oriented to person, place, and time.  Psychiatric:        Mood and Affect: Mood normal.        Behavior: Behavior normal.        Thought Content: Thought content normal.        Judgment: Judgment normal.     Imaging: Ct Abdomen Pelvis W Wo Contrast  Result Date: 07/01/2018 CLINICAL DATA:  Right renal cell carcinoma diagnosed in 2012 with right nephrectomy. Upper abdominal pain and diarrhea. Follow-up of stage IV disease. EXAM: CT CHEST WITH  CONTRAST CT ABDOMEN AND PELVIS WITH AND WITHOUT CONTRAST TECHNIQUE: Multidetector CT imaging of the chest was performed during intravenous contrast administration. Multidetector CT imaging of the abdomen and pelvis was performed following the standard protocol before and during bolus administration of intravenous contrast. CONTRAST:  181mL OMNIPAQUE IOHEXOL 300 MG/ML  SOLN COMPARISON:  Thoracic spine MRI MRI 06/04/2018. 06/21/2018 lumbar spine MRI. Abdominopelvic CT of 01/06/2018. Most recent chest CT 01/10/2015. FINDINGS: CT CHEST FINDINGS Cardiovascular: Aortic atherosclerosis. Tortuous thoracic aorta. Mild cardiomegaly, accentuated by a mild pectus excavatum deformity. Median sternotomy. Native coronary artery calcification including within the LAD and branches. No central pulmonary embolism, on this non-dedicated study. Mediastinum/Nodes: No supraclavicular adenopathy. No mediastinal or hilar adenopathy. Mildly dilated esophagus with subtle fluid level within, including image 45/11. The Lungs/Pleura: Trace left pleural fluid, decreased since the 06/21/2018 thoracic spine MRI. The right-sided pleural fluid has resolved. Mild centrilobular emphysema. Bibasilar scarring or subsegmental atelectasis. Clustered hyperdensities in the posterior left upper lobe are similar to 2016 and could relate to prior infection or aspiration of contrast. Musculoskeletal: Osteopenia. Mild T9, moderate T8,  and moderate T7 compression deformities, without ventral canal encroachment. No underlying well-defined lytic lesion. These compression fractures are new from 01/10/2015. CT ABDOMEN AND PELVIS FINDINGS Hepatobiliary: Normal liver. Normal gallbladder, without biliary ductal dilatation. Pancreas: Normal, without mass or ductal dilatation. Spleen: Old granulomatous disease in the spleen. Adrenals/Urinary Tract: Left adrenal thickening. Normal right adrenal gland. Right nephrectomy, without locally recurrent disease. Normal left kidney, without hydronephrosis. Suspect a small cystocele or urethral diverticulum, including on image 186/11. Stomach/Bowel: Normal stomach, without wall thickening. Normal colon, appendix, and terminal ileum. Normal small bowel. Vascular/Lymphatic: Aortic and branch vessel atherosclerosis. No abdominopelvic adenopathy. Reproductive: Hysterectomy. No adnexal mass. Prominent gonadal veins. Other: No significant free fluid.  Mild pelvic floor laxity. Musculoskeletal: Osteopenia. Mild superior endplate compression deformity is similar to 01/06/2018. IMPRESSION: 1. Status post right nephrectomy, without findings of soft tissue metastasis. 2. T7-9 compression deformities, as detailed on thoracic spine MRI. Given absence of osseous metastasis elsewhere, and consecutive levels, favor insufficiency fractures secondary to osteopenia. Metastatic disease felt less likely. Consider follow-up with pre and postcontrast thoracic spine MRI at 3 months. 3. Coronary artery atherosclerosis. Aortic Atherosclerosis (ICD10-I70.0). 4. Pelvic floor laxity. Tiny cystocele versus urethral diverticulum. 5. Esophageal air fluid level suggests dysmotility or gastroesophageal reflux. 6. Prominent gonadal veins, as can be seen with pelvic congestion syndrome. Electronically Signed   By: Abigail Miyamoto M.D.   On: 07/01/2018 14:24   Ct Chest W Contrast  Result Date: 07/01/2018 CLINICAL DATA:  Right renal cell carcinoma  diagnosed in 2012 with right nephrectomy. Upper abdominal pain and diarrhea. Follow-up of stage IV disease. EXAM: CT CHEST WITH CONTRAST CT ABDOMEN AND PELVIS WITH AND WITHOUT CONTRAST TECHNIQUE: Multidetector CT imaging of the chest was performed during intravenous contrast administration. Multidetector CT imaging of the abdomen and pelvis was performed following the standard protocol before and during bolus administration of intravenous contrast. CONTRAST:  115mL OMNIPAQUE IOHEXOL 300 MG/ML  SOLN COMPARISON:  Thoracic spine MRI MRI 06/04/2018. 06/21/2018 lumbar spine MRI. Abdominopelvic CT of 01/06/2018. Most recent chest CT 01/10/2015. FINDINGS: CT CHEST FINDINGS Cardiovascular: Aortic atherosclerosis. Tortuous thoracic aorta. Mild cardiomegaly, accentuated by a mild pectus excavatum deformity. Median sternotomy. Native coronary artery calcification including within the LAD and branches. No central pulmonary embolism, on this non-dedicated study. Mediastinum/Nodes: No supraclavicular adenopathy. No mediastinal or hilar adenopathy. Mildly dilated esophagus with subtle fluid level within, including image 45/11. The Lungs/Pleura: Trace  left pleural fluid, decreased since the 06/21/2018 thoracic spine MRI. The right-sided pleural fluid has resolved. Mild centrilobular emphysema. Bibasilar scarring or subsegmental atelectasis. Clustered hyperdensities in the posterior left upper lobe are similar to 2016 and could relate to prior infection or aspiration of contrast. Musculoskeletal: Osteopenia. Mild T9, moderate T8, and moderate T7 compression deformities, without ventral canal encroachment. No underlying well-defined lytic lesion. These compression fractures are new from 01/10/2015. CT ABDOMEN AND PELVIS FINDINGS Hepatobiliary: Normal liver. Normal gallbladder, without biliary ductal dilatation. Pancreas: Normal, without mass or ductal dilatation. Spleen: Old granulomatous disease in the spleen. Adrenals/Urinary  Tract: Left adrenal thickening. Normal right adrenal gland. Right nephrectomy, without locally recurrent disease. Normal left kidney, without hydronephrosis. Suspect a small cystocele or urethral diverticulum, including on image 186/11. Stomach/Bowel: Normal stomach, without wall thickening. Normal colon, appendix, and terminal ileum. Normal small bowel. Vascular/Lymphatic: Aortic and branch vessel atherosclerosis. No abdominopelvic adenopathy. Reproductive: Hysterectomy. No adnexal mass. Prominent gonadal veins. Other: No significant free fluid.  Mild pelvic floor laxity. Musculoskeletal: Osteopenia. Mild superior endplate compression deformity is similar to 01/06/2018. IMPRESSION: 1. Status post right nephrectomy, without findings of soft tissue metastasis. 2. T7-9 compression deformities, as detailed on thoracic spine MRI. Given absence of osseous metastasis elsewhere, and consecutive levels, favor insufficiency fractures secondary to osteopenia. Metastatic disease felt less likely. Consider follow-up with pre and postcontrast thoracic spine MRI at 3 months. 3. Coronary artery atherosclerosis. Aortic Atherosclerosis (ICD10-I70.0). 4. Pelvic floor laxity. Tiny cystocele versus urethral diverticulum. 5. Esophageal air fluid level suggests dysmotility or gastroesophageal reflux. 6. Prominent gonadal veins, as can be seen with pelvic congestion syndrome. Electronically Signed   By: Abigail Miyamoto M.D.   On: 07/01/2018 14:24   Ir Fluoro Guide Ndl Plmt / Bx  Result Date: 07/21/2018 INDICATION: Severe midthoracic pain secondary to compression fractures at T7, T8, T9. History of renal cell carcinoma. EXAM: FLUOROSCOPIC GUIDED BONE BIOPSIES AT T7-T8 AND T9 MEDICATIONS: None. ANESTHESIA/SEDATION: Moderate (conscious) sedation was employed during this procedure. A total of Versed 3 mg and Fentanyl 75 mcg was administered intravenously. Moderate Sedation Time: 33 minutes. The patient's level of consciousness and vital  signs were monitored continuously by radiology nursing throughout the procedure under my direct supervision. FLUOROSCOPY TIME:  Fluoroscopy Time: 10 minutes 12 seconds (460 mGy). COMPLICATIONS: None immediate. PROCEDURE: Informed written consent was obtained from the patient after a thorough discussion of the procedural risks, benefits and alternatives. All questions were addressed. Maximal Sterile Barrier Technique was utilized including caps, mask, sterile gowns, sterile gloves, sterile drape, hand hygiene and skin antiseptic. A timeout was performed prior to the initiation of the procedure. The patient was laid prone on the fluoroscopic table. The skin over the thoracic region was then prepped and draped in the usual sterile fashion. The left pedicle at T9, the right pedicle at T8 and the right pedicle at T7 were then identified. Infiltration with 0.25% bupivacaine was then carried out over the overlying skin entry sites and carried to the pedicles themselves. Thereafter, using biplane intermittent fluoroscopy, 13 gauge Cook spinal needles were then advanced into the posterior 1/3 at T7, T8 and T9. Through these, 59 gauge core biopsy needles were then advanced to the anterior 1/3. Using a 20 mL syringe, core biopsies were obtained at each of the 3 levels and sent in separate vials for analysis. The needles were removed. Hemostasis was achieved at the skin entry sites. The patient tolerated the procedure well. There were no acute complications. IMPRESSION: Status post fluoroscopic  guided core biopsies at T7, T8 and T9 as described above without event. Patient was asked to get in contact with her oncologist regarding reports of the biopsies. Patient expressed understanding and agreement with the above management plan. Electronically Signed   By: Luanne Bras M.D.   On: 07/20/2018 10:54   Ir Fluoro Guide Ndl Plmt / Bx  Result Date: 07/21/2018 INDICATION: Severe midthoracic pain secondary to compression  fractures at T7, T8, T9. History of renal cell carcinoma. EXAM: FLUOROSCOPIC GUIDED BONE BIOPSIES AT T7-T8 AND T9 MEDICATIONS: None. ANESTHESIA/SEDATION: Moderate (conscious) sedation was employed during this procedure. A total of Versed 3 mg and Fentanyl 75 mcg was administered intravenously. Moderate Sedation Time: 33 minutes. The patient's level of consciousness and vital signs were monitored continuously by radiology nursing throughout the procedure under my direct supervision. FLUOROSCOPY TIME:  Fluoroscopy Time: 10 minutes 12 seconds (460 mGy). COMPLICATIONS: None immediate. PROCEDURE: Informed written consent was obtained from the patient after a thorough discussion of the procedural risks, benefits and alternatives. All questions were addressed. Maximal Sterile Barrier Technique was utilized including caps, mask, sterile gowns, sterile gloves, sterile drape, hand hygiene and skin antiseptic. A timeout was performed prior to the initiation of the procedure. The patient was laid prone on the fluoroscopic table. The skin over the thoracic region was then prepped and draped in the usual sterile fashion. The left pedicle at T9, the right pedicle at T8 and the right pedicle at T7 were then identified. Infiltration with 0.25% bupivacaine was then carried out over the overlying skin entry sites and carried to the pedicles themselves. Thereafter, using biplane intermittent fluoroscopy, 13 gauge Cook spinal needles were then advanced into the posterior 1/3 at T7, T8 and T9. Through these, 24 gauge core biopsy needles were then advanced to the anterior 1/3. Using a 20 mL syringe, core biopsies were obtained at each of the 3 levels and sent in separate vials for analysis. The needles were removed. Hemostasis was achieved at the skin entry sites. The patient tolerated the procedure well. There were no acute complications. IMPRESSION: Status post fluoroscopic guided core biopsies at T7, T8 and T9 as described above  without event. Patient was asked to get in contact with her oncologist regarding reports of the biopsies. Patient expressed understanding and agreement with the above management plan. Electronically Signed   By: Luanne Bras M.D.   On: 07/20/2018 10:54   Ir Fluoro Guide Ndl Plmt / Bx  Result Date: 07/21/2018 INDICATION: Severe midthoracic pain secondary to compression fractures at T7, T8, T9. History of renal cell carcinoma. EXAM: FLUOROSCOPIC GUIDED BONE BIOPSIES AT T7-T8 AND T9 MEDICATIONS: None. ANESTHESIA/SEDATION: Moderate (conscious) sedation was employed during this procedure. A total of Versed 3 mg and Fentanyl 75 mcg was administered intravenously. Moderate Sedation Time: 33 minutes. The patient's level of consciousness and vital signs were monitored continuously by radiology nursing throughout the procedure under my direct supervision. FLUOROSCOPY TIME:  Fluoroscopy Time: 10 minutes 12 seconds (460 mGy). COMPLICATIONS: None immediate. PROCEDURE: Informed written consent was obtained from the patient after a thorough discussion of the procedural risks, benefits and alternatives. All questions were addressed. Maximal Sterile Barrier Technique was utilized including caps, mask, sterile gowns, sterile gloves, sterile drape, hand hygiene and skin antiseptic. A timeout was performed prior to the initiation of the procedure. The patient was laid prone on the fluoroscopic table. The skin over the thoracic region was then prepped and draped in the usual sterile fashion. The left pedicle at  T9, the right pedicle at T8 and the right pedicle at T7 were then identified. Infiltration with 0.25% bupivacaine was then carried out over the overlying skin entry sites and carried to the pedicles themselves. Thereafter, using biplane intermittent fluoroscopy, 13 gauge Cook spinal needles were then advanced into the posterior 1/3 at T7, T8 and T9. Through these, 56 gauge core biopsy needles were then advanced to the  anterior 1/3. Using a 20 mL syringe, core biopsies were obtained at each of the 3 levels and sent in separate vials for analysis. The needles were removed. Hemostasis was achieved at the skin entry sites. The patient tolerated the procedure well. There were no acute complications. IMPRESSION: Status post fluoroscopic guided core biopsies at T7, T8 and T9 as described above without event. Patient was asked to get in contact with her oncologist regarding reports of the biopsies. Patient expressed understanding and agreement with the above management plan. Electronically Signed   By: Luanne Bras M.D.   On: 07/20/2018 10:54    Labs:  CBC: Recent Labs    02/10/18 0900 07/01/18 1146 07/19/18 1042 07/29/18 0630  WBC 4.7 6.6 5.2 5.7  HGB 15.1* 14.7 14.7 15.0  HCT 44.3 42.6 44.5 45.0  PLT 325.0 372 339 362    COAGS: Recent Labs    07/19/18 1042 07/29/18 0630  INR 1.05 1.09    BMP: Recent Labs    02/10/18 0900 07/01/18 1146 07/29/18 0630  NA 134* 130* 133*  K 3.8 4.8 3.9  CL 100 96* 99  CO2 22 23 22   GLUCOSE 69* 99 82  BUN 13 13 8   CALCIUM 10.2 10.4* 9.8  CREATININE 1.09 1.10* 1.08*  GFRNONAA  --  47* 48*  GFRAA  --  54* 55*    LIVER FUNCTION TESTS: Recent Labs    02/10/18 0900 07/01/18 1146  BILITOT 1.0 0.8  AST 15 17  ALT 10 10  ALKPHOS 56 89  PROT 7.7 8.1  ALBUMIN 4.6 4.5    TUMOR MARKERS: No results for input(s): AFPTM, CEA, CA199, CHROMGRNA in the last 8760 hours.  Assessment and Plan: Thoracic 7-8-9 fractures Pathology negative for malignancy Plan to proceed with vertebral augmentation today. Labs ok Risks and benefits of VP/KP were discussed with the patient including, but not limited to education regarding the natural healing process of compression fractures without intervention, bleeding, infection, cement migration which may cause spinal cord damage, paralysis, pulmonary embolism or even death.  This interventional procedure involves the use  of X-rays and because of the nature of the planned procedure, it is possible that we will have prolonged use of X-ray fluoroscopy.  Potential radiation risks to you include (but are not limited to) the following: - A slightly elevated risk for cancer  several years later in life. This risk is typically less than 0.5% percent. This risk is low in comparison to the normal incidence of human cancer, which is 33% for women and 50% for men according to the [employer]. - Radiation induced injury can include skin redness, resembling a rash, tissue breakdown / ulcers and hair loss (which can be temporary or permanent).   The likelihood of either of these occurring depends on the difficulty of the procedure and whether you are sensitive to radiation due to previous procedures, disease, or genetic conditions.   IF your procedure requires a prolonged use of radiation, you will be notified and given written instructions for further action.  It is your responsibility to monitor the  irradiated area for the 2 weeks following the procedure and to notify your physician if you are concerned that you have suffered a radiation induced injury.    All of the patient's questions were answered, patient is agreeable to proceed.  Consent signed and in chart.    Electronically Signed: Ascencion Dike, PA-C 07/29/2018, 7:47 AM   I spent a total of    25 Minutes in face to face in clinical consultation, greater than 50% of which was counseling/coordinating care for Thoracic 7/8/9 KP

## 2018-07-29 NOTE — Procedures (Signed)
S/P Balloon KP at T7,T8 and T9

## 2018-07-29 NOTE — Progress Notes (Signed)
Pt transferred to SS bed 12 via 4 person logroll and slide sheet.  Pt tolerated transfer without incident.  Pt remains flat in bed.  Dressing to thoracic area examined with Hetty Ely.  Pt AOx4.  Updated report of vitals and wound assessment.  Pt care transferred to [facility]

## 2018-07-29 NOTE — Discharge Instructions (Addendum)
Percutaneous Vertebroplasty 1.no stooping,bending or lifting more than 10,lbs for 2 weeks. 2.Use walker to ambulate for 2 weeks. 3.No driving for 2 weeks. .RTC PRN 2 weeks KYPHOPLASTY/VERTEBROPLASTY DISCHARGE INSTRUCTIONS  Medications: (check all that apply)     Resume all home medications as before procedure.                      Continue your pain medications as prescribed as needed.  Over the next 3-5 days, decrease your pain medication as tolerated.  Over the counter medications (i.e. Tylenol, ibuprofen, and aleve) may be substituted once severe/moderate pain symptoms have subsided.   Wound Care: - Bandages may be removed the day following your procedure.  You may get your incision wet once bandages are removed.  Bandaids may be used to cover the incisions until scab formation.  Topical ointments are optional.  - If you develop a fever greater than 101 degrees, have increased skin redness at the incision sites or pus-like oozing from incisions occurring within 1 week of the procedure, contact radiology at [phone] or [phone].  - Ice pack to back for 15-20 minutes 2-3 time per day for first 2-3 days post procedure.  The ice will expedite muscle healing and help with the pain from the incisions.   Activity: - Bedrest today with limited activity for 24 hours post procedure.  - No driving for 48 hours.  - Increase your activity as tolerated after bedrest (with assistance if necessary).  - Refrain from any strenuous activity or heavy lifting (greater than 10 lbs.).   Follow up: -

## 2018-07-29 NOTE — Sedation Documentation (Signed)
Dr. Estanislado Pandy made aware of elevated BP

## 2018-07-30 ENCOUNTER — Other Ambulatory Visit (HOSPITAL_COMMUNITY): Payer: Self-pay | Admitting: Student

## 2018-07-30 ENCOUNTER — Encounter (HOSPITAL_COMMUNITY): Payer: Self-pay | Admitting: Interventional Radiology

## 2018-07-30 ENCOUNTER — Telehealth: Payer: Self-pay | Admitting: Student

## 2018-07-30 DIAGNOSIS — S22000A Wedge compression fracture of unspecified thoracic vertebra, initial encounter for closed fracture: Secondary | ICD-10-CM

## 2018-07-30 MED ORDER — HYDROCODONE-ACETAMINOPHEN 5-325 MG PO TABS: 1.0000 | ORAL_TABLET | Freq: Four times a day (QID) | ORAL | 0 refills | Status: DC | PRN

## 2018-07-30 NOTE — Telephone Encounter (Signed)
Received phone call from patient regarding pain and post-procedure questions.  Dr. Estanislado Pandy and I both spoke with patient.  Patient complains of back pain/restless night due to back pain. She is requesting pain medication for this. Per Dr. Estanislado Pandy, administer Narco 5-325 mg taken once Q6H PRN moderate/severe back pain (dispense 5 tablets with 0 refills). Medication e-prescribed to [facility] in [place], [place] [phone]).  Patient states that she was told in short stay that she needed a walker. Recommended that patient use a walker over the next two weeks if one is available to her. Patient states that she does not have a walker she can borrow, but promises to avoid bending/stooping/lifting more than 10 pounds over the next two weeks.  All questions answered and concerns addressed. Patient conveys understanding and agrees with plan.  Bea Graff Reeta Kuk, PA-C 07/30/2018, 10:11 AM

## 2018-08-02 ENCOUNTER — Telehealth (HOSPITAL_COMMUNITY): Payer: Self-pay | Admitting: Student

## 2018-08-02 ENCOUNTER — Other Ambulatory Visit (HOSPITAL_COMMUNITY): Payer: Self-pay | Admitting: Student

## 2018-08-02 DIAGNOSIS — S22000A Wedge compression fracture of unspecified thoracic vertebra, initial encounter for closed fracture: Secondary | ICD-10-CM

## 2018-08-02 MED ORDER — HYDROCODONE-ACETAMINOPHEN 5-325 MG PO TABS: 1.0000 | ORAL_TABLET | Freq: Four times a day (QID) | ORAL | 0 refills | Status: DC | PRN

## 2018-08-02 NOTE — Telephone Encounter (Signed)
Received phone call from patient who states she went to pick up her prescription for Norco but it was not at pharmacy.  I personally e-prescribed medication 07/30/2018 (see note).  [facility] [phone]) at 1150 who states that they lost power last week, and did not receive e-prescription. Re e-prescribed medication to pharmacy- Norco 5-325 mg tablets, take one tablet Q6H prn moderate/severe back pain, dispense 5 tablets with 0 refills.  Bea Graff Louk, PA-C 08/02/2018, 12:07 PM

## 2018-08-04 ENCOUNTER — Ambulatory Visit (INDEPENDENT_AMBULATORY_CARE_PROVIDER_SITE_OTHER): Payer: PPO | Admitting: Orthopaedic Surgery

## 2018-08-04 ENCOUNTER — Encounter (INDEPENDENT_AMBULATORY_CARE_PROVIDER_SITE_OTHER): Payer: Self-pay | Admitting: Orthopaedic Surgery

## 2018-08-04 DIAGNOSIS — S22000D Wedge compression fracture of unspecified thoracic vertebra, subsequent encounter for fracture with routine healing: Secondary | ICD-10-CM | POA: Diagnosis not present

## 2018-08-04 NOTE — Progress Notes (Signed)
The patient is seen in follow-up after she did have a thoracic spine kyphoplasty by the interventional radiologist about a week ago.  She is a very active 85 year old and does feel better but she is frustrated that she is not further along.  We had a long and thorough discussion about how she needs to go slow and not get truly frustrated that things will improve with time that overall she looks way better than anybody at her age usually goes to this type of procedure.  She ambulates without any type of assistive device and overall appears in no acute distress.  She has just some mild pain of her spine when we put her through range of motion and palpation.  She like this reassurance that she will do well.  I told her just to give this time.  I would like to see her back in 6 weeks.  I would like an AP and lateral of the thoracic spine and the lumbar spine.

## 2018-08-23 ENCOUNTER — Ambulatory Visit (INDEPENDENT_AMBULATORY_CARE_PROVIDER_SITE_OTHER): Payer: PPO | Admitting: Physician Assistant

## 2018-08-23 ENCOUNTER — Encounter (INDEPENDENT_AMBULATORY_CARE_PROVIDER_SITE_OTHER): Payer: Self-pay | Admitting: Physician Assistant

## 2018-08-23 DIAGNOSIS — M545 Low back pain, unspecified: Secondary | ICD-10-CM

## 2018-08-23 MED ORDER — GABAPENTIN 100 MG PO CAPS: 300.0000 mg | ORAL_CAPSULE | Freq: Every day | ORAL | 2 refills | Status: DC

## 2018-08-23 NOTE — Progress Notes (Signed)
Office Visit Note   Patient: Nicole Preston           Date of Birth: 1934-06-15           MRN: [id number] Visit Date: 08/23/2018              Requested by: Haywood Pao, MD [address] [place], [place] [address] PCP: Haywood Pao, MD   Assessment & Plan: Visit Diagnoses:  1. Acute bilateral low back pain without sciatica     Plan: Due to the fact that she got good relief of her low back pain in the past with facet injections by Dr. Ernestina Patches we will repeat these injections.  We will see her back in 3 weeks as planned time obtain AP and lateral views of her thoracic spine and lumbar spine.  Follow-Up Instructions: Return in about 3 weeks (around 09/13/2018).   Orders:  No orders of the defined types were placed in this encounter.  Meds ordered this encounter  Medications  . gabapentin (NEURONTIN) 100 MG capsule    Sig: Take 3 capsules (300 mg total) by mouth at bedtime.    Dispense:  90 capsule    Refill:  2      Procedures: No procedures performed   Clinical Data: No additional findings.   Subjective: Chief Complaint  Patient presents with  . Lower Back - Pain    HPI [name] comes in today early due to some low back pain.  She denies any radicular symptoms down either leg.  She recently underwent arthroplasty at T7-T8 and T9 by Dr. Waldemar Dickens due to compression fractures.  She is now having this new onset of low back pain.  Has history of low back pain that responded well to bilateral facet injections at L5-S1.  She has had no new injury to her back.  Pain is worse with prolonged standing or ambulation.  She is having no waking pain.  She is taking some Aleve with minimal relief.  She is been trying some heat to the back also. Review of Systems See HPI  Objective: Vital Signs: There were no vitals taken for this visit.  Physical Exam Constitutional:      Appearance: She is not ill-appearing or diaphoretic.  Pulmonary:     Effort: Pulmonary  effort is normal.  Neurological:     Mental Status: She is alert and oriented to person, place, and time.     Ortho Exam Negative straight leg raise bilaterally.  5 out of 5 strength throughout the lower extremities against resistance.  She is able to touch her toes has limited extension lumbar spine. Specialty Comments:  No specialty comments available.  Imaging: No results found.   PMFS History: Patient Active Problem List   Diagnosis Date Noted  . AKI (acute kidney injury) ([place]) 05/02/2016  . Acute cystitis 05/01/2016  . Dizziness 05/01/2016  . Colitis 04/30/2016  . Benign essential HTN 04/05/2014  . Superficial injury of shoulder with infection 03/21/2014  . Colon polyp 01/02/2012  . Hemorrhage of rectum and anus 12/09/2011  . Renal cell carcinoma ([place]) 12/09/2011   Past Medical History:  Diagnosis Date  . Arthritis   . COPD (chronic obstructive pulmonary disease) ([place])   . Hemorrhoids   . Hypertension   . Infection 02/2014   RT SHOULDER  . Osteopetrosis   . Osteoporosis   . Renal cancer ([place]) 4/12   rt nephrectomy  . S/p nephrectomy     Family History  Problem Relation Age of Onset  . Kidney cancer Mother   . Melanoma Father   . Lung cancer Brother   . Colon cancer Neg Hx     Past Surgical History:  Procedure Laterality Date  . ABDOMINAL HYSTERECTOMY    . ABDOMINAL SURGERY  2012   for renal carcinoma tumor  . BICEPT TENODESIS Right 03/21/2014   Procedure: BICEPS TENODESIS;  Surgeon: Meredith Pel, MD;  Location: [facility];  Service: Orthopedics;  Laterality: Right;  . COLONOSCOPY  12/29/2011   Procedure: COLONOSCOPY;  Surgeon: Inda Castle, MD;  Location: [facility];  Service: Endoscopy;  Laterality: N/A;  . EYE SURGERY Left    cataract surgery  . IR FLUORO GUIDED NEEDLE PLC ASPIRATION/INJECTION LOC  07/19/2018  . IR FLUORO GUIDED NEEDLE PLC ASPIRATION/INJECTION LOC  07/19/2018  . IR FLUORO GUIDED NEEDLE PLC ASPIRATION/INJECTION LOC  07/19/2018  . IR  KYPHO EA ADDL LEVEL THORACIC OR LUMBAR  07/29/2018  . IR KYPHO EA ADDL LEVEL THORACIC OR LUMBAR  07/29/2018  . IR KYPHO THORACIC WITH BONE BIOPSY  07/29/2018  . IRRIGATION AND DEBRIDEMENT SHOULDER Right 03/21/2014   Procedure: IRRIGATION AND DEBRIDEMENT SHOULDER, BICEPS TENODESIS;  Surgeon: Meredith Pel, MD;  Location: [facility];  Service: Orthopedics;  Laterality: Right;  I&D RIGHT SHOULDER WITH BICEPS TENODESIS  . NEPHRECTOMY  2012   RT KIDNEY   Social History   Occupational History  . Occupation: Retired   Tobacco Use  . Smoking status: Former Smoker    Packs/day: 1.00    Types: Cigarettes    Last attempt to quit: 12/29/1983    Years since quitting: 34.6  . Smokeless tobacco: Never Used  Substance and Sexual Activity  . Alcohol use: Yes    Alcohol/week: 14.0 standard drinks    Types: 14 Glasses of wine per week    Comment: 1-2 glasses of wine/day  . Drug use: No  . Sexual activity: Not on file

## 2018-08-24 ENCOUNTER — Other Ambulatory Visit (INDEPENDENT_AMBULATORY_CARE_PROVIDER_SITE_OTHER): Payer: Self-pay

## 2018-08-24 DIAGNOSIS — M545 Low back pain, unspecified: Secondary | ICD-10-CM

## 2018-09-13 ENCOUNTER — Encounter (INDEPENDENT_AMBULATORY_CARE_PROVIDER_SITE_OTHER): Payer: Self-pay | Admitting: Physical Medicine and Rehabilitation

## 2018-09-14 ENCOUNTER — Telehealth (INDEPENDENT_AMBULATORY_CARE_PROVIDER_SITE_OTHER): Payer: Self-pay | Admitting: Orthopaedic Surgery

## 2018-09-14 ENCOUNTER — Telehealth (INDEPENDENT_AMBULATORY_CARE_PROVIDER_SITE_OTHER): Payer: Self-pay

## 2018-09-14 NOTE — Telephone Encounter (Signed)
Spoke with patient asked pre screening and patient answered no to all questions   Patient will come to her appointment tomorrow

## 2018-09-14 NOTE — Telephone Encounter (Signed)
Tried calling patient about r/s appointment for 03/25. No answer. No VM

## 2018-09-15 ENCOUNTER — Ambulatory Visit (INDEPENDENT_AMBULATORY_CARE_PROVIDER_SITE_OTHER): Payer: PPO

## 2018-09-15 ENCOUNTER — Encounter (INDEPENDENT_AMBULATORY_CARE_PROVIDER_SITE_OTHER): Payer: Self-pay | Admitting: Orthopaedic Surgery

## 2018-09-15 ENCOUNTER — Other Ambulatory Visit: Payer: Self-pay

## 2018-09-15 ENCOUNTER — Ambulatory Visit (INDEPENDENT_AMBULATORY_CARE_PROVIDER_SITE_OTHER): Payer: PPO | Admitting: Orthopaedic Surgery

## 2018-09-15 DIAGNOSIS — S22000D Wedge compression fracture of unspecified thoracic vertebra, subsequent encounter for fracture with routine healing: Secondary | ICD-10-CM

## 2018-09-15 DIAGNOSIS — M545 Low back pain, unspecified: Secondary | ICD-10-CM

## 2018-09-15 NOTE — Progress Notes (Signed)
The patient is a 85 year old female well-known to [place].  She has been dealing with compression fractures at in the thoracic spine at 3 levels that had successful vertebroplasty's or kyphoplasty's.  She is walking without assistive device.  She is very active.  She had an MRI in December of her lumbar spine it does show some nerve compression so she is set up for an intervention later in April by Dr. Ernestina Patches which will be an ESI.  He has had injections by him before.  She is had no increase in her back pain.  We were x-rayed her today to compare to previous films to make sure no compression deformities had increased at all in the lumbar spine.  She is never had any type of vertebroplasty lumbar spine.  She gets up out of her chair easily.  She has good mobility of her thoracic and lumbar spine with no significant pain to palpation.  She still has a component of sciatica bilaterally.  At this point she will keep her appoint with Dr. Ernestina Patches for an intervention in the lumbar spine and the need to get her back to me in a month after that.  All question concerns were answered and addressed.

## 2018-10-06 ENCOUNTER — Encounter (INDEPENDENT_AMBULATORY_CARE_PROVIDER_SITE_OTHER): Payer: Self-pay | Admitting: Physical Medicine and Rehabilitation

## 2018-11-02 ENCOUNTER — Ambulatory Visit: Payer: Self-pay

## 2018-11-02 ENCOUNTER — Ambulatory Visit: Payer: PPO | Admitting: Physical Medicine and Rehabilitation

## 2018-11-02 ENCOUNTER — Encounter: Payer: Self-pay | Admitting: Physical Medicine and Rehabilitation

## 2018-11-02 ENCOUNTER — Other Ambulatory Visit: Payer: Self-pay

## 2018-11-02 VITALS — BP 113/64 | HR 70 | Temp 97.5°F

## 2018-11-02 DIAGNOSIS — M47816 Spondylosis without myelopathy or radiculopathy, lumbar region: Secondary | ICD-10-CM

## 2018-11-02 DIAGNOSIS — M5116 Intervertebral disc disorders with radiculopathy, lumbar region: Secondary | ICD-10-CM

## 2018-11-02 MED ORDER — METHYLPREDNISOLONE ACETATE 80 MG/ML IJ SUSP
80.0000 mg | Freq: Once | INTRAMUSCULAR | Status: AC
  Administered 2018-11-02: 80 mg

## 2018-11-02 NOTE — Progress Notes (Signed)
.  Numeric Pain Rating Scale and Functional Assessment Average Pain 3   In the last MONTH (on 0-10 scale) has pain interfered with the following?  1. General activity like being  able to carry out your everyday physical activities such as walking, climbing stairs, carrying groceries, or moving a chair?  Rating(3)   +Driver, -BT, -Dye Allergies.

## 2018-11-02 NOTE — Progress Notes (Signed)
Nicole Preston - 85 y.o. female MRN [id number]  Date of birth: [birth year]  Office Visit Note: Visit Date: 11/02/2018 PCP: Haywood Pao, MD Referred by: Haywood Pao, MD  Subjective: Chief Complaint  Patient presents with   Lower Back - Pain   HPI: Nicole Preston is a 85 y.o. female who comes in today At the request of Dr. Jean Rosenthal for evaluation management of worsening chronic history of low back pain.  We actually saw the patient in 2016 and completed bilateral L5-S1 facet joint blocks and she did quite well with that.  She has worked as a former Marine scientist and spent a lot of time on her feet and doing a lot of activities and she still tries to stay active.  Unfortunately with the coronavirus pandemic she is not been able to workout at the gym since it closed.  She does feel like overall since she has been seen Dr. Ninfa Linden over the last several months her low back pain is maybe gotten a little bit better but it is worse at night particular trying to lay down and on the left side.  No radicular pain down the legs.  No focal weakness.  Her history is interesting in that she had a diagnosis of renal cell carcinoma status post nephrectomy without recurrence but then was found to have thoracic compression fractures.  Concern at that time was negative metastatic disease.  She has follow-up with the cancer center and there is really been no evidence of metastatic disease at least to the notes that I can see.  Specimen collected at the time of kyphoplasty did not show any pathology of cancer source of the lesion.  She reports her thoracic spine has decreased dramatically in pain since the kyphoplasty but she still gets a twinge every now and then depending on certain movements.  Her biggest complaint really is across the lower back left more than right worse at night sometimes in the morning and with activity.  She has been using gabapentin and that seems to help.  It does affect her daily  activities.  Review of Systems  Constitutional: Negative for chills, fever, malaise/fatigue and weight loss.  HENT: Negative for hearing loss and sinus pain.   Eyes: Negative for blurred vision, double vision and photophobia.  Respiratory: Negative for cough and shortness of breath.   Cardiovascular: Negative for chest pain, palpitations and leg swelling.  Gastrointestinal: Negative for abdominal pain, nausea and vomiting.  Genitourinary: Negative for flank pain.  Musculoskeletal: Positive for back pain. Negative for myalgias.  Skin: Negative for itching and rash.  Neurological: Negative for tremors, focal weakness and weakness.  Endo/Heme/Allergies: Negative.   Psychiatric/Behavioral: Negative for depression.  All other systems reviewed and are negative.  Otherwise per HPI.  Assessment & Plan: Visit Diagnoses:  1. Radiculopathy due to lumbar intervertebral disc disorder   2. Spondylosis without myelopathy or radiculopathy, lumbar region     Plan: Findings:  Chronic history of back pain typically thought at the time a few years ago to be related to spondylosis and facet arthropathy which she does have.  Newest MRI of the lumbar spine shows facet arthropathy at L4-5 and L5-S1 without central canal stenosis.  There is a left small disc protrusion affecting the lateral recess at L4-5 which could affect the L5 nerve root.  While the patient does not have classic radicular pain she does have left-sided low back pain that seems to be worse at night at the  end of the day and she just cannot get comfortable which does sound nerve root related.  We are going to complete a diagnostic and therapeutic left L5 transforaminal epidural steroid injection.  Depending on relief would consider diagnostic medial branch blocks and radiofrequency ablation.  We did talk about home exercises that she can do during the time where she can go to the gym.  She is also walking a lot more which I think is good for her as  well.  We also encouraged the use of the gabapentin.    Meds & Orders:  Meds ordered this encounter  Medications   methylPREDNISolone acetate (DEPO-MEDROL) injection 80 mg    Orders Placed This Encounter  Procedures   XR C-ARM NO REPORT   Epidural Steroid injection    Follow-up: Return if symptoms worsen or fail to improve.   Procedures: No procedures performed  Lumbosacral Transforaminal Epidural Steroid Injection - Sub-Pedicular Approach with Fluoroscopic Guidance  Patient: Nicole Preston      Date of Birth: 01-18-1934 MRN: [id number] PCP: Haywood Pao, MD      Visit Date: 11/02/2018   Universal Protocol:    Date/Time: 11/02/2018  Consent Given By: the patient  Position: PRONE  Additional Comments: Vital signs were monitored before and after the procedure. Patient was prepped and draped in the usual sterile fashion. The correct patient, procedure, and site was verified.   Injection Procedure Details:  Procedure Site One Meds Administered:  Meds ordered this encounter  Medications   methylPREDNISolone acetate (DEPO-MEDROL) injection 80 mg    Laterality: Left  Location/Site:  L5-S1  Needle size: 22 G  Needle type: Spinal  Needle Placement: Transforaminal  Findings:    -Comments: Excellent flow of contrast along the nerve and into the epidural space.  Procedure Details: After squaring off the end-plates to get a true AP view, the C-arm was positioned so that an oblique view of the foramen as noted above was visualized. The target area is just inferior to the "nose of the scotty dog" or sub pedicular. The soft tissues overlying this structure were infiltrated with 2-3 ml. of 1% Lidocaine without Epinephrine.  The spinal needle was inserted toward the target using a "trajectory" view along the fluoroscope beam.  Under AP and lateral visualization, the needle was advanced so it did not puncture dura and was located close the 6 O'Clock position of the  pedical in AP tracterory. Biplanar projections were used to confirm position. Aspiration was confirmed to be negative for CSF and/or blood. A 1-2 ml. volume of Isovue-250 was injected and flow of contrast was noted at each level. Radiographs were obtained for documentation purposes.   After attaining the desired flow of contrast documented above, a 0.5 to 1.0 ml test dose of 0.25% Marcaine was injected into each respective transforaminal space.  The patient was observed for 90 seconds post injection.  After no sensory deficits were reported, and normal lower extremity motor function was noted,   the above injectate was administered so that equal amounts of the injectate were placed at each foramen (level) into the transforaminal epidural space.   Additional Comments:  The patient tolerated the procedure well Dressing: 2 x 2 sterile gauze and Band-Aid    Post-procedure details: Patient was observed during the procedure. Post-procedure instructions were reviewed.  Patient left the clinic in stable condition.     Clinical History: MRI LUMBAR SPINE WITHOUT CONTRAST  TECHNIQUE: Multiplanar, multisequence MR imaging of the lumbar spine was  performed. No intravenous contrast was administered.  COMPARISON:  Radiographs 06/02/2018, CT abdomen pelvis 01/06/2018, lumbar MRI 08/23/2012  FINDINGS: Segmentation:  Normal  Alignment:  Normal  Vertebrae: Mild compression fracture of T9. T9 is only partially included on this study. The technologist extended the field-of-view to include the lower thoracic spine, as requested. No axial images through the T9 fracture. There is ill-defined edema in the vertebral body of T9. No definite epidural mass or cord compression  Superior endplate deformity of L4 with Schmorl's node is chronic and unchanged.  Compression fracture of L5 is chronic and unchanged from prior studies.  Conus medullaris and cauda equina: Conus extends to the L1-2  level. Conus and cauda equina appear normal.  Paraspinal and other soft tissues: Possible gallstones. No paraspinous mass or adenopathy.  Disc levels:  L1-2: Mild disc degeneration and disc bulging without stenosis. Mild facet degeneration.  L2-3: Disc degeneration. Disc bulging and mild spurring. No significant stenosis. Mild facet degeneration. Small subligamentous synovial cyst on the left without compromise of the spinal canal.  L3-4: Moderate disc degeneration right greater than left. Disc bulging and spurring. Bilateral facet hypertrophy. No focal disc protrusion or stenosis  L4-5: Small left-sided annular tear and small disc protrusion with compression of the left L5 nerve root in the subarticular zone. This was not present previously. Bilateral facet degeneration and mild spinal stenosis.  L5-S1: Moderate disc and facet degeneration causing mild spinal stenosis and mild subarticular stenosis bilaterally.  IMPRESSION: 1. Mild compression fracture T9, only partially included in the study. Based on limited imaging, this could represent a benign compression fracture however given history of renal cell carcinoma, pathologic fracture is possible. Follow-up MRI thoracic spine without with contrast suggested for further evaluation of this fracture. 2. Chronic fractures L4-L5 unchanged from prior studies 3. Multilevel degenerative changes as above. Small left-sided disc protrusion L4-5 with impingement left L5 nerve root.   Electronically Signed   By: Franchot Gallo M.D.   On: 06/04/2018 07:02   She reports that she quit smoking about 34 years ago. Her smoking use included cigarettes. She smoked 1.00 pack per day. She has never used smokeless tobacco. No results for input(s): HGBA1C, LABURIC in the last 8760 hours.  Objective:  VS:  HT:     WT:    BMI:      BP:113/64   HR:70bpm   TEMP:(!) 97.5 F (36.4 C)(Temporal)   RESP:  Physical Exam Vitals signs and  nursing note reviewed.  Constitutional:      General: She is not in acute distress.    Appearance: Normal appearance. She is well-developed. She is not ill-appearing.  HENT:     Head: Normocephalic and atraumatic.  Eyes:     Conjunctiva/sclera: Conjunctivae normal.     Pupils: Pupils are equal, round, and reactive to light.  Cardiovascular:     Rate and Rhythm: Normal rate.     Pulses: Normal pulses.  Pulmonary:     Effort: Pulmonary effort is normal.  Musculoskeletal:     Right lower leg: No edema.     Left lower leg: No edema.     Comments: Patient ambulates without aid with a fairly normal gait.  She has some difficulty going to sit to stand but not very problematic.  Some pain with extension and facet loading.  She has good distal strength.  Skin:    General: Skin is warm and dry.     Findings: No erythema or rash.  Neurological:  General: No focal deficit present.     Mental Status: She is alert and oriented to person, place, and time.     Sensory: No sensory deficit.     Motor: No abnormal muscle tone.     Coordination: Coordination normal.     Gait: Gait normal.  Psychiatric:        Mood and Affect: Mood normal.        Behavior: Behavior normal.     Ortho Exam Imaging: Xr C-arm No Report  Result Date: 11/02/2018 Please see Notes tab for imaging impression.   Past Medical/Family/Surgical/Social History: Medications & Allergies reviewed per EMR, new medications updated. Patient Active Problem List   Diagnosis Date Noted   AKI (acute kidney injury) ([facility]) 05/02/2016   Acute cystitis 05/01/2016   Dizziness 05/01/2016   Colitis 04/30/2016   Benign essential HTN 04/05/2014   Superficial injury of shoulder with infection 03/21/2014   Colon polyp 01/02/2012   Hemorrhage of rectum and anus 12/09/2011   Renal cell carcinoma ([place]) 12/09/2011   Past Medical History:  Diagnosis Date   Arthritis    COPD (chronic obstructive pulmonary disease) ([facility])     Hemorrhoids    Hypertension    Infection 02/2014   RT SHOULDER   Osteopetrosis    Osteoporosis    Renal cancer ([facility]) 4/12   rt nephrectomy   S/p nephrectomy    Family History  Problem Relation Age of Onset   Kidney cancer Mother    Melanoma Father    Lung cancer Brother    Colon cancer Neg Hx    Past Surgical History:  Procedure Laterality Date   ABDOMINAL HYSTERECTOMY     ABDOMINAL SURGERY  2012   for renal carcinoma tumor   BICEPT TENODESIS Right 03/21/2014   Procedure: BICEPS TENODESIS;  Surgeon: Meredith Pel, MD;  Location: [facility];  Service: Orthopedics;  Laterality: Right;   COLONOSCOPY  12/29/2011   Procedure: COLONOSCOPY;  Surgeon: Inda Castle, MD;  Location: [facility];  Service: Endoscopy;  Laterality: N/A;   EYE SURGERY Left    cataract surgery   IR FLUORO GUIDED NEEDLE PLC ASPIRATION/INJECTION LOC  07/19/2018   IR FLUORO GUIDED NEEDLE PLC ASPIRATION/INJECTION LOC  07/19/2018   IR FLUORO GUIDED NEEDLE PLC ASPIRATION/INJECTION LOC  07/19/2018   IR KYPHO EA ADDL LEVEL THORACIC OR LUMBAR  07/29/2018   IR KYPHO EA ADDL LEVEL THORACIC OR LUMBAR  07/29/2018   IR KYPHO THORACIC WITH BONE BIOPSY  07/29/2018   IRRIGATION AND DEBRIDEMENT SHOULDER Right 03/21/2014   Procedure: IRRIGATION AND DEBRIDEMENT SHOULDER, BICEPS TENODESIS;  Surgeon: Meredith Pel, MD;  Location: [facility];  Service: Orthopedics;  Laterality: Right;  I&D RIGHT SHOULDER WITH BICEPS TENODESIS   NEPHRECTOMY  2012   RT KIDNEY   Social History   Occupational History   Occupation: Retired   Tobacco Use   Smoking status: Former Smoker    Packs/day: 1.00    Types: Cigarettes    Last attempt to quit: 12/29/1983    Years since quitting: 34.8   Smokeless tobacco: Never Used  Substance and Sexual Activity   Alcohol use: Yes    Alcohol/week: 14.0 standard drinks    Types: 14 Glasses of wine per week    Comment: 1-2 glasses of wine/day   Drug use: No   Sexual activity: Not on  file

## 2018-11-02 NOTE — Procedures (Signed)
Lumbosacral Transforaminal Epidural Steroid Injection - Sub-Pedicular Approach with Fluoroscopic Guidance  Patient: Nicole Preston      Date of Birth: Sep 11, 1933 MRN: [id number] PCP: Haywood Pao, MD      Visit Date: 11/02/2018   Universal Protocol:    Date/Time: 11/02/2018  Consent Given By: the patient  Position: PRONE  Additional Comments: Vital signs were monitored before and after the procedure. Patient was prepped and draped in the usual sterile fashion. The correct patient, procedure, and site was verified.   Injection Procedure Details:  Procedure Site One Meds Administered:  Meds ordered this encounter  Medications  . methylPREDNISolone acetate (DEPO-MEDROL) injection 80 mg    Laterality: Left  Location/Site:  L5-S1  Needle size: 22 G  Needle type: Spinal  Needle Placement: Transforaminal  Findings:    -Comments: Excellent flow of contrast along the nerve and into the epidural space.  Procedure Details: After squaring off the end-plates to get a true AP view, the C-arm was positioned so that an oblique view of the foramen as noted above was visualized. The target area is just inferior to the "nose of the scotty dog" or sub pedicular. The soft tissues overlying this structure were infiltrated with 2-3 ml. of 1% Lidocaine without Epinephrine.  The spinal needle was inserted toward the target using a "trajectory" view along the fluoroscope beam.  Under AP and lateral visualization, the needle was advanced so it did not puncture dura and was located close the 6 O'Clock position of the pedical in AP tracterory. Biplanar projections were used to confirm position. Aspiration was confirmed to be negative for CSF and/or blood. A 1-2 ml. volume of Isovue-250 was injected and flow of contrast was noted at each level. Radiographs were obtained for documentation purposes.   After attaining the desired flow of contrast documented above, a 0.5 to 1.0 ml test dose of  0.25% Marcaine was injected into each respective transforaminal space.  The patient was observed for 90 seconds post injection.  After no sensory deficits were reported, and normal lower extremity motor function was noted,   the above injectate was administered so that equal amounts of the injectate were placed at each foramen (level) into the transforaminal epidural space.   Additional Comments:  The patient tolerated the procedure well Dressing: 2 x 2 sterile gauze and Band-Aid    Post-procedure details: Patient was observed during the procedure. Post-procedure instructions were reviewed.  Patient left the clinic in stable condition.

## 2018-11-16 ENCOUNTER — Telehealth: Payer: Self-pay | Admitting: Physical Medicine and Rehabilitation

## 2018-11-16 NOTE — Telephone Encounter (Signed)
She needs bilateral L5-S1 facet joint blocks.  That was the plan from my note and that is what she actually had in 2016 for relief.  If no relief from facet joint block we could talk about a plan to follow-up with Dr. Ninfa Linden.

## 2018-11-17 NOTE — Telephone Encounter (Signed)
Scheduled for 6/9 at 0930.

## 2018-11-30 ENCOUNTER — Ambulatory Visit (INDEPENDENT_AMBULATORY_CARE_PROVIDER_SITE_OTHER): Payer: PPO | Admitting: Physical Medicine and Rehabilitation

## 2018-11-30 ENCOUNTER — Ambulatory Visit: Payer: PPO

## 2018-11-30 ENCOUNTER — Other Ambulatory Visit: Payer: Self-pay

## 2018-11-30 ENCOUNTER — Encounter: Payer: Self-pay | Admitting: Physical Medicine and Rehabilitation

## 2018-11-30 VITALS — BP 120/64 | HR 62

## 2018-11-30 DIAGNOSIS — M47816 Spondylosis without myelopathy or radiculopathy, lumbar region: Secondary | ICD-10-CM | POA: Diagnosis not present

## 2018-11-30 MED ORDER — METHYLPREDNISOLONE ACETATE 80 MG/ML IJ SUSP: 80.0000 mg | Freq: Once | INTRAMUSCULAR | Status: AC

## 2018-11-30 NOTE — Progress Notes (Signed)
Numeric Pain Rating Scale and Functional Assessment Average Pain 6   In the last MONTH (on 0-10 scale) has pain interfered with the following?  1. General activity like being  able to carry out your everyday physical activities such as walking, climbing stairs, carrying groceries, or moving a chair?  Rating(8)    +Driver, -BT, -Dye Allergies.  

## 2018-12-14 ENCOUNTER — Other Ambulatory Visit (INDEPENDENT_AMBULATORY_CARE_PROVIDER_SITE_OTHER): Payer: Self-pay | Admitting: Physician Assistant

## 2018-12-14 NOTE — Telephone Encounter (Signed)
Please advise 

## 2018-12-15 ENCOUNTER — Encounter: Payer: Self-pay | Admitting: Physical Medicine and Rehabilitation

## 2018-12-15 NOTE — Progress Notes (Signed)
Nicole Preston - 85 y.o. female MRN [id number]  Date of birth: Oct 10, 1933  Office Visit Note: Visit Date: 11/30/2018 PCP: Haywood Pao, MD Referred by: Haywood Pao, MD  Subjective: Chief Complaint  Patient presents with  . Lower Back - Pain   HPI:  Nicole Preston is a 85 y.o. female who comes in today For planned diagnostic and therapeutic medial branch blocks at L5-S1 bilaterally.  When I saw the patient she was having some left radicular leg pain and chronic worsening low back pain.  We completed epidural injection her leg pain is resolved at this point.  Again she has no radicular pain at this point.  MRI and x-rays have shown facet arthropathy at L5-S1 with some foraminal narrowing but no high-grade central stenosis.  She has had unfortunately several vertebral body compression fractures and these have gone on to heal and have had kyphoplasty and she is under the care of Dr. Ninfa Linden.  She is failed conservative care otherwise and pain is worse with extension and facet loading.  Worse with standing better at rest really consistent with facet mediated pain.  We did complete diagnostic blocks and a double block paradigm would look at radiofrequency ablation.  ROS Otherwise per HPI.  Assessment & Plan: Visit Diagnoses:  1. Spondylosis without myelopathy or radiculopathy, lumbar region     Plan: No additional findings.   Meds & Orders:  Meds ordered this encounter  Medications  . methylPREDNISolone acetate (DEPO-MEDROL) injection 80 mg    Orders Placed This Encounter  Procedures  . Facet Injection  . XR C-ARM NO REPORT    Follow-up: No follow-ups on file.   Procedures: No procedures performed  Lumbar Diagnostic Facet Joint Nerve Block with Fluoroscopic Guidance   Patient: Nicole Preston      Date of Birth: [birth year] MRN: [id number] PCP: Haywood Pao, MD      Visit Date: 11/30/2018   Universal Protocol:    Date/Time: 06/24/205:35 AM  Consent Given By:  the patient  Position: PRONE  Additional Comments: Vital signs were monitored before and after the procedure. Patient was prepped and draped in the usual sterile fashion. The correct patient, procedure, and site was verified.   Injection Procedure Details:  Procedure Site One Meds Administered:  Meds ordered this encounter  Medications  . methylPREDNISolone acetate (DEPO-MEDROL) injection 80 mg     Laterality: Bilateral  Location/Site:  L5-S1  Needle size: 22 ga.  Needle type:spinal  Needle Placement: Oblique pedical  Findings:   -Comments: There was excellent flow of contrast along the articular pillars without intravascular flow.  Procedure Details: The fluoroscope beam is vertically oriented in AP and then obliqued 15 to 20 degrees to the ipsilateral side of the desired nerve to achieve the "Scotty dog" appearance.  The skin over the target area of the junction of the superior articulating process and the transverse process (sacral ala if blocking the L5 dorsal rami) was locally anesthetized with a 1 ml volume of 1% Lidocaine without Epinephrine.  The spinal needle was inserted and advanced in a trajectory view down to the target.   After contact with periosteum and negative aspirate for blood and CSF, correct placement without intravascular or epidural spread was confirmed by injecting 0.5 ml. of Isovue-250.  A spot radiograph was obtained of this image.    Next, a 0.5 ml. volume of the injectate described above was injected. The needle was then redirected to the other facet joint nerves  mentioned above if needed.  Prior to the procedure, the patient was given a Pain Diary which was completed for baseline measurements.  After the procedure, the patient rated their pain every 30 minutes and will continue rating at this frequency for a total of 5 hours.  The patient has been asked to complete the Diary and return to [place] by mail, fax or hand delivered as soon as possible.    Additional Comments:  The patient tolerated the procedure well Dressing: 2 x 2 sterile gauze and Band-Aid    Post-procedure details: Patient was observed during the procedure. Post-procedure instructions were reviewed.  Patient left the clinic in stable condition.   Clinical History: MRI LUMBAR SPINE WITHOUT CONTRAST  TECHNIQUE: Multiplanar, multisequence MR imaging of the lumbar spine was performed. No intravenous contrast was administered.  COMPARISON:  Radiographs 06/02/2018, CT abdomen pelvis 01/06/2018, lumbar MRI 08/23/2012  FINDINGS: Segmentation:  Normal  Alignment:  Normal  Vertebrae: Mild compression fracture of T9. T9 is only partially included on this study. The technologist extended the field-of-view to include the lower thoracic spine, as requested. No axial images through the T9 fracture. There is ill-defined edema in the vertebral body of T9. No definite epidural mass or cord compression  Superior endplate deformity of L4 with Schmorl's node is chronic and unchanged.  Compression fracture of L5 is chronic and unchanged from prior studies.  Conus medullaris and cauda equina: Conus extends to the L1-2 level. Conus and cauda equina appear normal.  Paraspinal and other soft tissues: Possible gallstones. No paraspinous mass or adenopathy.  Disc levels:  L1-2: Mild disc degeneration and disc bulging without stenosis. Mild facet degeneration.  L2-3: Disc degeneration. Disc bulging and mild spurring. No significant stenosis. Mild facet degeneration. Small subligamentous synovial cyst on the left without compromise of the spinal canal.  L3-4: Moderate disc degeneration right greater than left. Disc bulging and spurring. Bilateral facet hypertrophy. No focal disc protrusion or stenosis  L4-5: Small left-sided annular tear and small disc protrusion with compression of the left L5 nerve root in the subarticular zone. This was not present  previously. Bilateral facet degeneration and mild spinal stenosis.  L5-S1: Moderate disc and facet degeneration causing mild spinal stenosis and mild subarticular stenosis bilaterally.  IMPRESSION: 1. Mild compression fracture T9, only partially included in the study. Based on limited imaging, this could represent a benign compression fracture however given history of renal cell carcinoma, pathologic fracture is possible. Follow-up MRI thoracic spine without with contrast suggested for further evaluation of this fracture. 2. Chronic fractures L4-L5 unchanged from prior studies 3. Multilevel degenerative changes as above. Small left-sided disc protrusion L4-5 with impingement left L5 nerve root.   Electronically Signed   By: Franchot Gallo M.D.   On: 06/04/2018 07:02     Objective:  VS:  HT:    WT:   BMI:     BP:120/64  HR:62bpm  TEMP: ( )  RESP:98 % Physical Exam  Ortho Exam Imaging: No results found.

## 2018-12-15 NOTE — Procedures (Signed)
Lumbar Diagnostic Facet Joint Nerve Block with Fluoroscopic Guidance   Patient: Nicole Preston      Date of Birth: [birth year] MRN: [id number] PCP: Haywood Pao, MD      Visit Date: 11/30/2018   Universal Protocol:    Date/Time: 06/24/205:35 AM  Consent Given By: the patient  Position: PRONE  Additional Comments: Vital signs were monitored before and after the procedure. Patient was prepped and draped in the usual sterile fashion. The correct patient, procedure, and site was verified.   Injection Procedure Details:  Procedure Site One Meds Administered:  Meds ordered this encounter  Medications  . methylPREDNISolone acetate (DEPO-MEDROL) injection 80 mg     Laterality: Bilateral  Location/Site:  L5-S1  Needle size: 22 ga.  Needle type:spinal  Needle Placement: Oblique pedical  Findings:   -Comments: There was excellent flow of contrast along the articular pillars without intravascular flow.  Procedure Details: The fluoroscope beam is vertically oriented in AP and then obliqued 15 to 20 degrees to the ipsilateral side of the desired nerve to achieve the "Scotty dog" appearance.  The skin over the target area of the junction of the superior articulating process and the transverse process (sacral ala if blocking the L5 dorsal rami) was locally anesthetized with a 1 ml volume of 1% Lidocaine without Epinephrine.  The spinal needle was inserted and advanced in a trajectory view down to the target.   After contact with periosteum and negative aspirate for blood and CSF, correct placement without intravascular or epidural spread was confirmed by injecting 0.5 ml. of Isovue-250.  A spot radiograph was obtained of this image.    Next, a 0.5 ml. volume of the injectate described above was injected. The needle was then redirected to the other facet joint nerves mentioned above if needed.  Prior to the procedure, the patient was given a Pain Diary which was completed for  baseline measurements.  After the procedure, the patient rated their pain every 30 minutes and will continue rating at this frequency for a total of 5 hours.  The patient has been asked to complete the Diary and return to [place] by mail, fax or hand delivered as soon as possible.   Additional Comments:  The patient tolerated the procedure well Dressing: 2 x 2 sterile gauze and Band-Aid    Post-procedure details: Patient was observed during the procedure. Post-procedure instructions were reviewed.  Patient left the clinic in stable condition.

## 2018-12-28 ENCOUNTER — Ambulatory Visit: Payer: PPO | Admitting: Orthopaedic Surgery

## 2019-01-10 DIAGNOSIS — R197 Diarrhea, unspecified: Secondary | ICD-10-CM | POA: Diagnosis not present

## 2019-01-10 DIAGNOSIS — R1110 Vomiting, unspecified: Secondary | ICD-10-CM | POA: Diagnosis not present

## 2019-01-18 DIAGNOSIS — Z87310 Personal history of (healed) osteoporosis fracture: Secondary | ICD-10-CM | POA: Diagnosis not present

## 2019-01-18 DIAGNOSIS — S22000D Wedge compression fracture of unspecified thoracic vertebra, subsequent encounter for fracture with routine healing: Secondary | ICD-10-CM | POA: Diagnosis not present

## 2019-02-23 ENCOUNTER — Ambulatory Visit: Payer: PPO | Admitting: Gastroenterology

## 2019-03-30 ENCOUNTER — Other Ambulatory Visit (INDEPENDENT_AMBULATORY_CARE_PROVIDER_SITE_OTHER): Payer: Self-pay | Admitting: Orthopaedic Surgery

## 2019-03-30 NOTE — Telephone Encounter (Signed)
Please advise 

## 2019-04-05 DIAGNOSIS — E559 Vitamin D deficiency, unspecified: Secondary | ICD-10-CM | POA: Diagnosis not present

## 2019-04-05 DIAGNOSIS — E7800 Pure hypercholesterolemia, unspecified: Secondary | ICD-10-CM | POA: Diagnosis not present

## 2019-04-05 DIAGNOSIS — I10 Essential (primary) hypertension: Secondary | ICD-10-CM | POA: Diagnosis not present

## 2019-04-06 DIAGNOSIS — I10 Essential (primary) hypertension: Secondary | ICD-10-CM | POA: Diagnosis not present

## 2019-04-06 DIAGNOSIS — R82998 Other abnormal findings in urine: Secondary | ICD-10-CM | POA: Diagnosis not present

## 2019-04-12 DIAGNOSIS — M5136 Other intervertebral disc degeneration, lumbar region: Secondary | ICD-10-CM | POA: Diagnosis not present

## 2019-04-12 DIAGNOSIS — N1831 Chronic kidney disease, stage 3a: Secondary | ICD-10-CM | POA: Diagnosis not present

## 2019-04-12 DIAGNOSIS — I700 Atherosclerosis of aorta: Secondary | ICD-10-CM | POA: Diagnosis not present

## 2019-04-12 DIAGNOSIS — I129 Hypertensive chronic kidney disease with stage 1 through stage 4 chronic kidney disease, or unspecified chronic kidney disease: Secondary | ICD-10-CM | POA: Diagnosis not present

## 2019-04-12 DIAGNOSIS — Z1331 Encounter for screening for depression: Secondary | ICD-10-CM | POA: Diagnosis not present

## 2019-04-12 DIAGNOSIS — K5900 Constipation, unspecified: Secondary | ICD-10-CM | POA: Diagnosis not present

## 2019-04-12 DIAGNOSIS — Z0000 Encounter for general adult medical examination without abnormal findings: Secondary | ICD-10-CM | POA: Diagnosis not present

## 2019-04-12 DIAGNOSIS — J309 Allergic rhinitis, unspecified: Secondary | ICD-10-CM | POA: Diagnosis not present

## 2019-04-12 DIAGNOSIS — S22000A Wedge compression fracture of unspecified thoracic vertebra, initial encounter for closed fracture: Secondary | ICD-10-CM | POA: Diagnosis not present

## 2019-04-12 DIAGNOSIS — R809 Proteinuria, unspecified: Secondary | ICD-10-CM | POA: Diagnosis not present

## 2019-04-12 DIAGNOSIS — D692 Other nonthrombocytopenic purpura: Secondary | ICD-10-CM | POA: Diagnosis not present

## 2019-04-12 DIAGNOSIS — M810 Age-related osteoporosis without current pathological fracture: Secondary | ICD-10-CM | POA: Diagnosis not present

## 2019-04-12 DIAGNOSIS — Z1339 Encounter for screening examination for other mental health and behavioral disorders: Secondary | ICD-10-CM | POA: Diagnosis not present

## 2019-07-12 IMAGING — XA IR FLUORO GUIDE NDL PLMT / BX
4 series · 11 of 11 positions shown · non-contrast
Comparison: none

INDICATION: Severe midthoracic pain secondary to compression fractures at T7,
T8, T9. History of renal cell carcinoma.

[Series 1: single · 2 of 2 slices shown (1 of 4)]
[im 1/2]
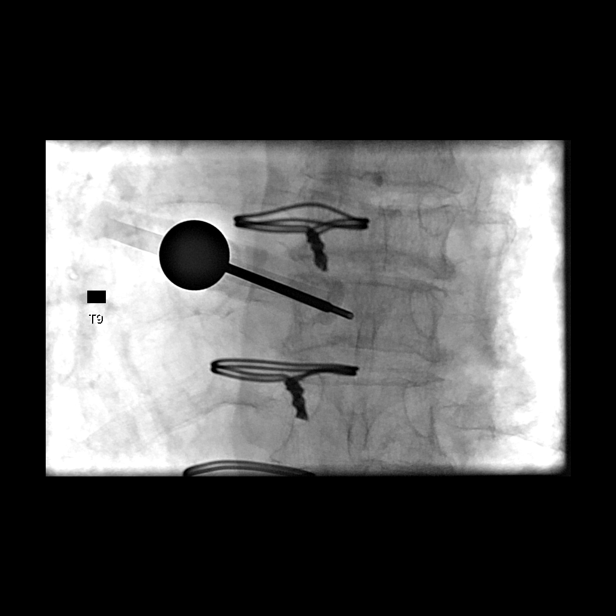
[im 2/2]
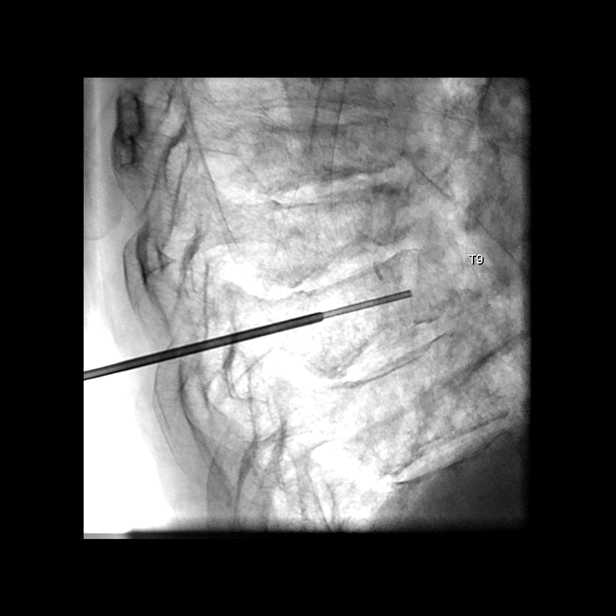

[Series 2: single · 4 of 4 slices shown (2 of 4)]
[im 1/4]
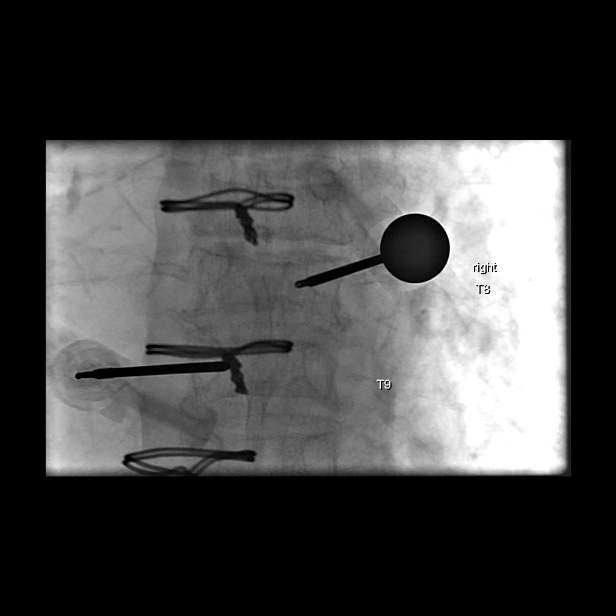
[im 2/4]
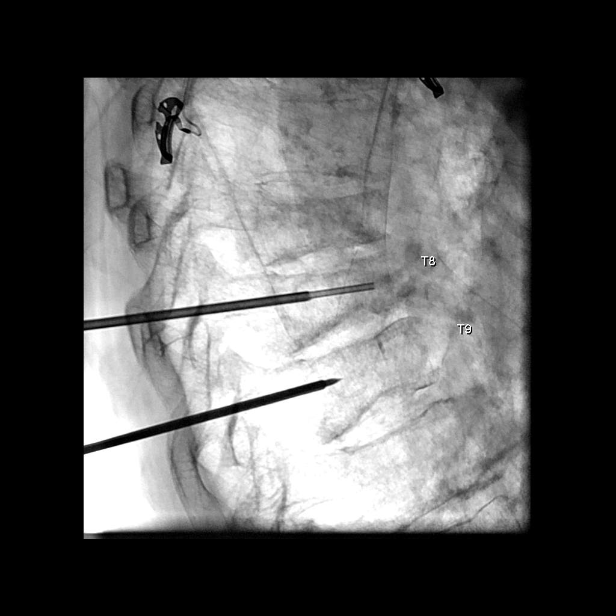
[im 3/4]
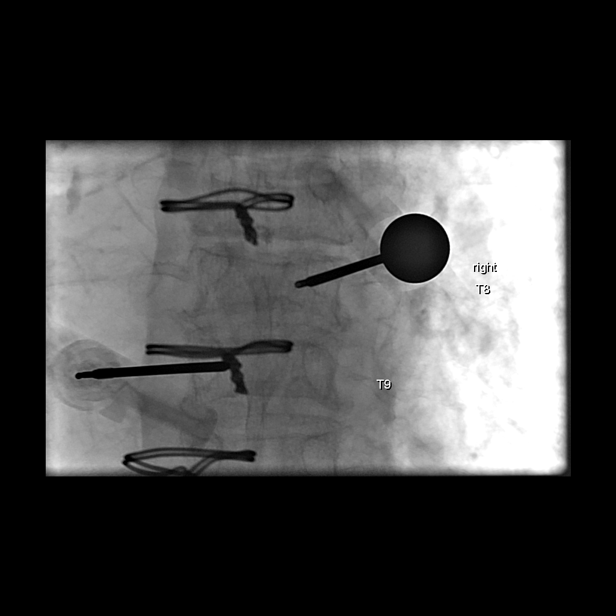
[im 4/4]
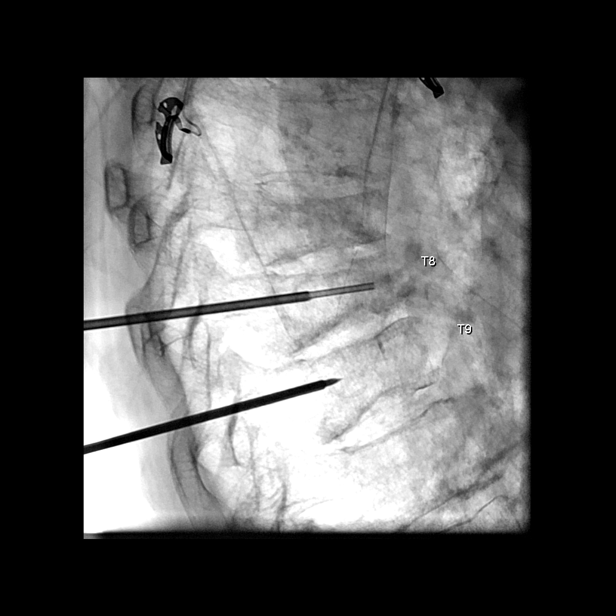

[Series 3: single · 3 of 3 slices shown (3 of 4)]
[im 1/3]
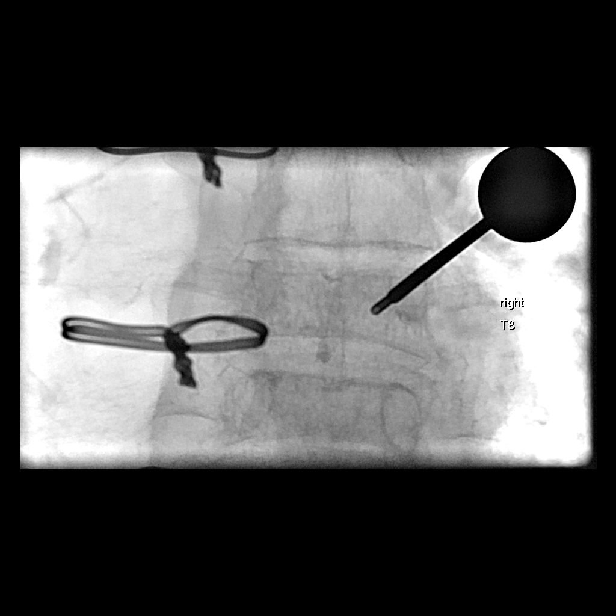
[im 2/3]
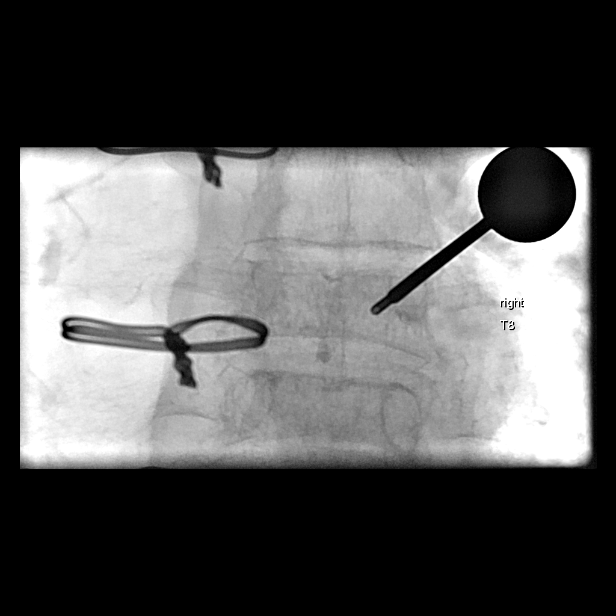
[im 3/3]
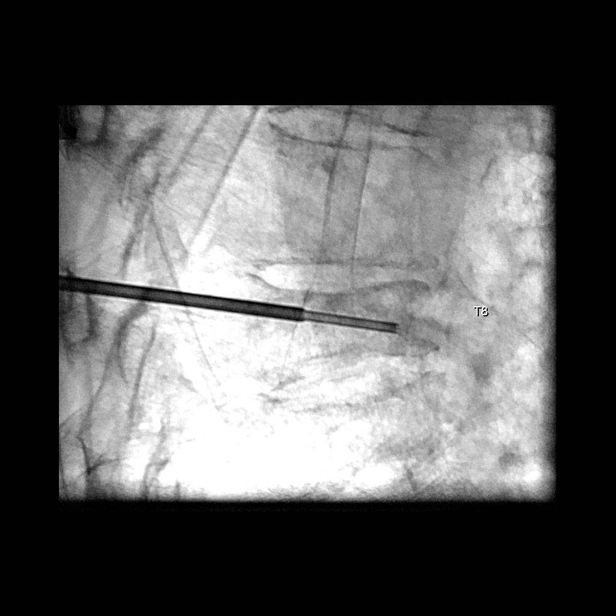

[Series 4: single · 2 of 2 slices shown (4 of 4)]
[im 1/2]
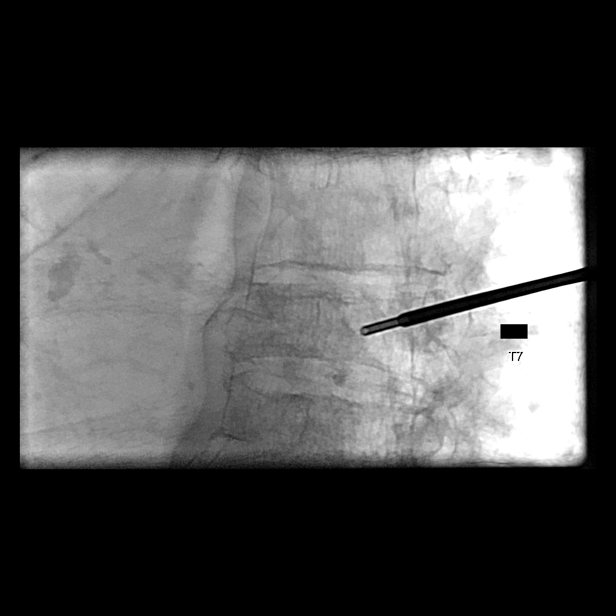
[im 2/2]
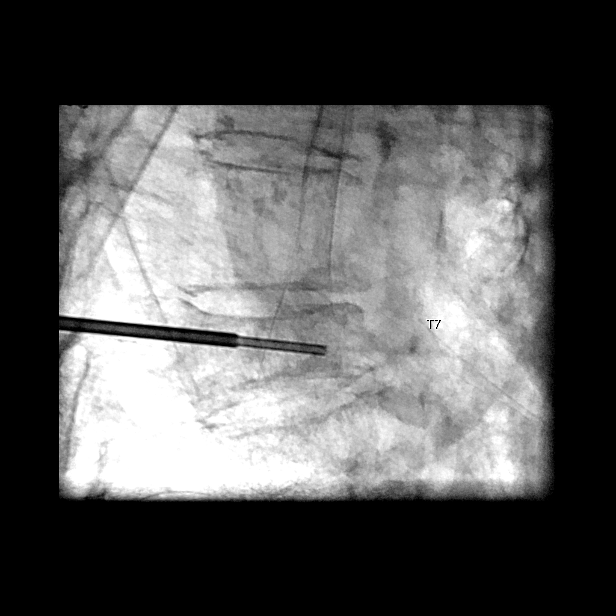

[11 of 11 positions shown; findings below may reference images not displayed]

EXAM:
FLUOROSCOPIC GUIDED BONE BIOPSIES AT T7-T8 AND T9

MEDICATIONS:
None.

ANESTHESIA/SEDATION:
Moderate (conscious) sedation was employed during this procedure. A
total of Versed 3 mg and Fentanyl 75 mcg was administered
intravenously.

Moderate Sedation Time: 33 minutes. The patient's level of
consciousness and vital signs were monitored continuously by
radiology nursing throughout the procedure under my direct
supervision.

FLUOROSCOPY TIME:  Fluoroscopy Time: 10 minutes 12 seconds (460
mGy).

COMPLICATIONS:
None immediate.

PROCEDURE:
Informed written consent was obtained from the patient after a
thorough discussion of the procedural risks, benefits and
alternatives. All questions were addressed. Maximal Sterile Barrier
Technique was utilized including caps, mask, sterile gowns, sterile
gloves, sterile drape, hand hygiene and skin antiseptic. A timeout
was performed prior to the initiation of the procedure.

The patient was laid prone on the fluoroscopic table. The skin over
the thoracic region was then prepped and draped in the usual sterile
fashion.

The left pedicle at T9, the right pedicle at T8 and the right
pedicle at T7 were then identified. Infiltration with 0.25%
bupivacaine was then carried out over the overlying skin entry sites
and carried to the pedicles themselves.

Thereafter, using biplane intermittent fluoroscopy, 13 gauge Aishan Best
spinal needles were then advanced into the posterior [DATE] at T7, T8
and T9. Through these, 16 gauge core biopsy needles were then
advanced to the anterior [DATE]. Using a 20 mL syringe, core biopsies
were obtained at each of the 3 levels and sent in separate vials for
analysis.

The needles were removed. Hemostasis was achieved at the skin entry
sites. The patient tolerated the procedure well. There were no acute
complications.
IMPRESSION: Status post fluoroscopic guided core biopsies at T7, T8 and T9 as
described above without event.

Patient was asked to get in contact with her oncologist regarding
reports of the biopsies. Patient expressed understanding and
agreement with the above management plan.

## 2019-07-22 IMAGING — XA IR KYPHO VERTEBRAL ADDL AGMENTATION
1 series · 13 of 24 positions shown · non-contrast
Comparison: MRI thoracic spine of 06/21/2018.

INDICATION: Severe lower thoracic pain secondary to compression fracture at T7,
T8 and T9.

EXAM:
BALLOON KYPHOPLASTIES AT T7, T8 AND T9
CLINICAL DATA: Severe lower thoracic pain secondary to compression
fractures at T7, T8 and T9.

[Series 300: dr. (person_name) · 13 of 34 slices shown]
[im 1/34]
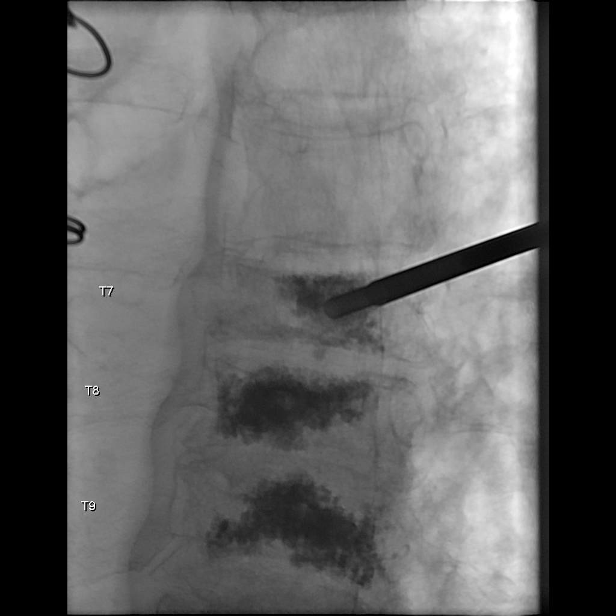
[im 3/34]
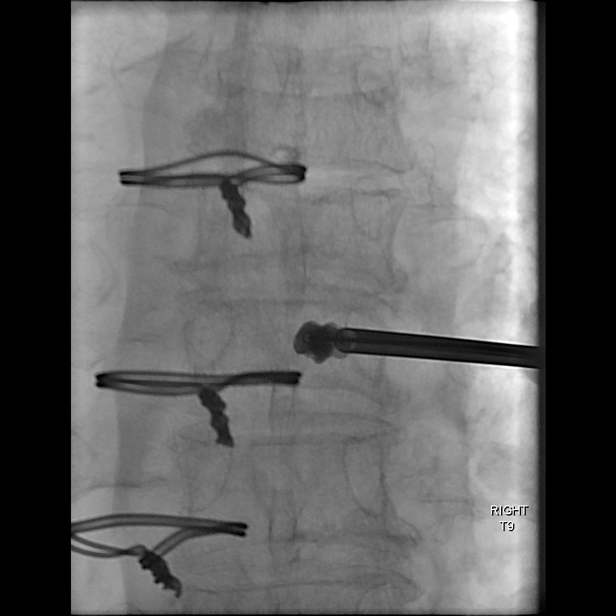
[im 6/34]
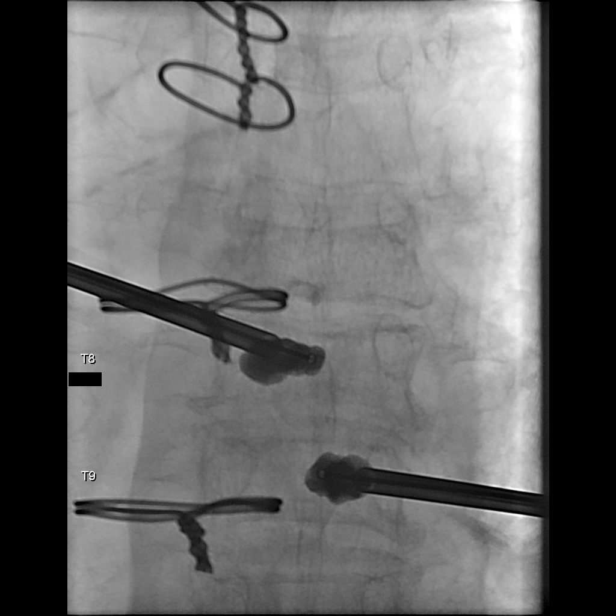
[im 9/34]
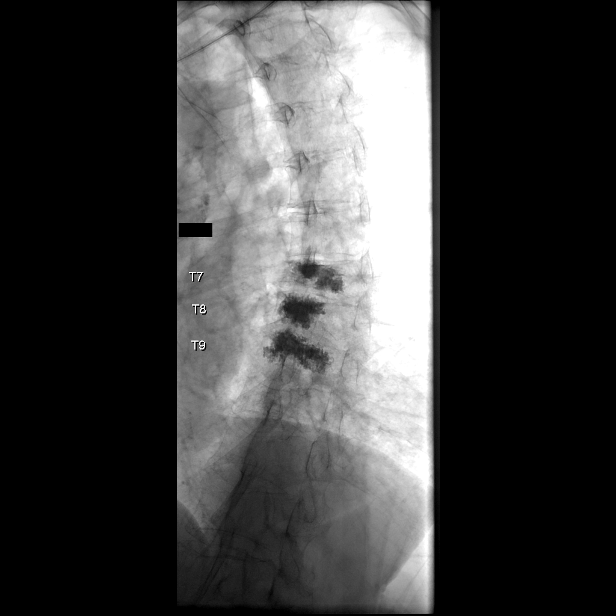
[im 12/34]
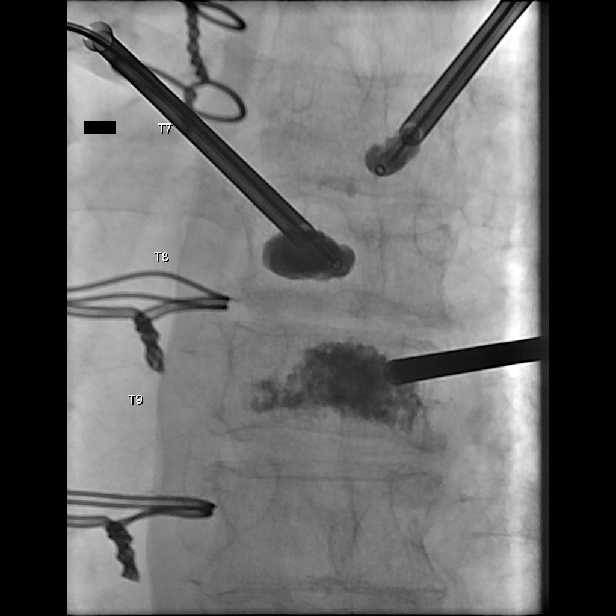
[im 15/34]
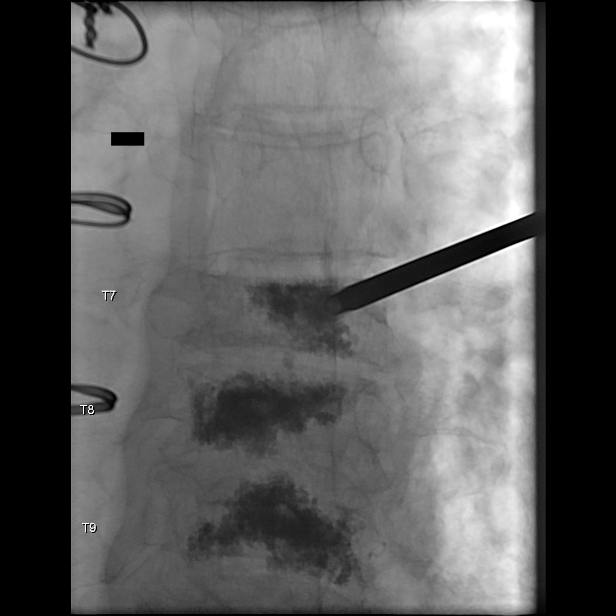
[im 18/34]
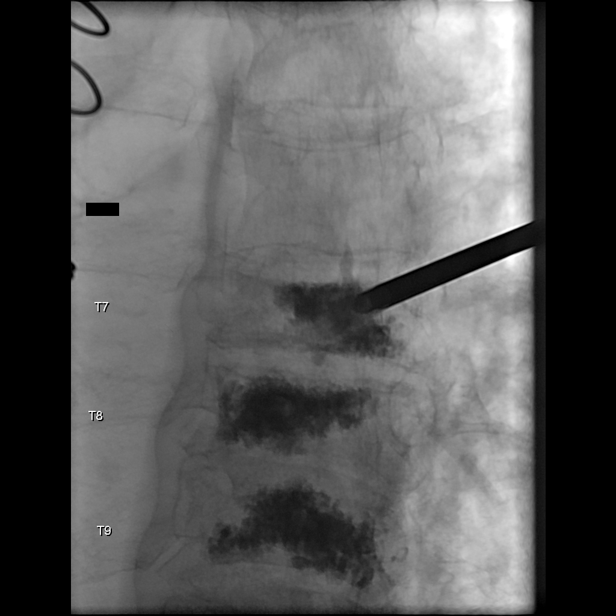
[im 19/34]
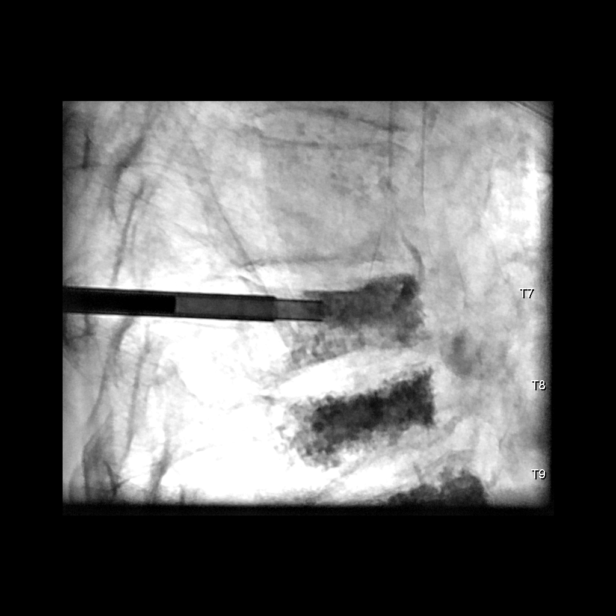
[im 22/34]
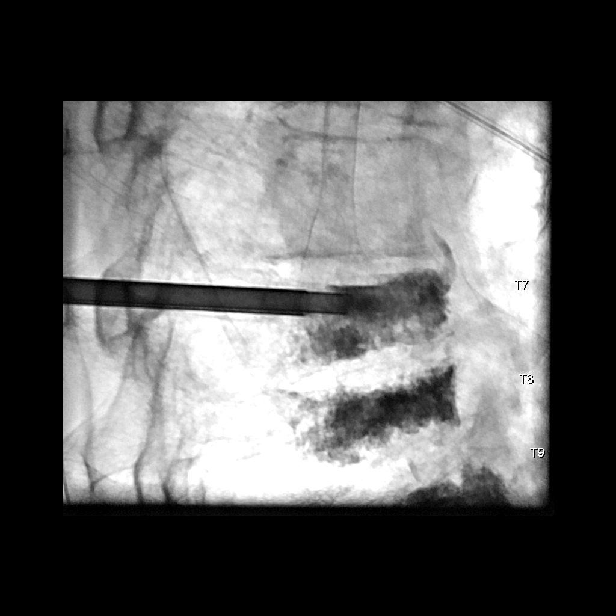
[im 25/34]
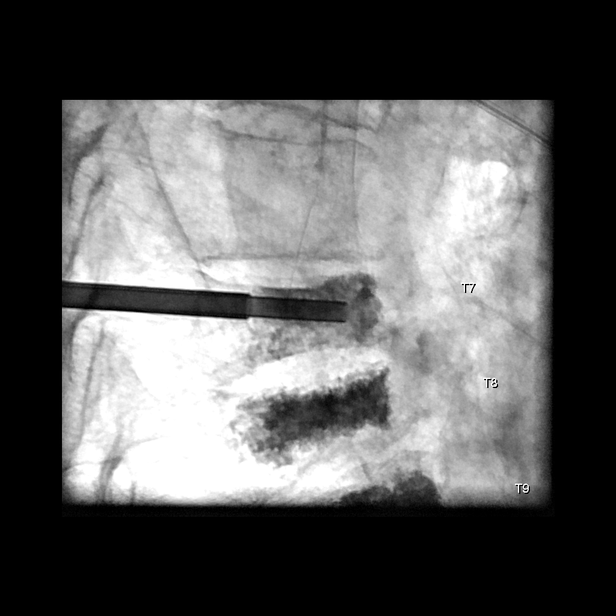
[im 28/34]
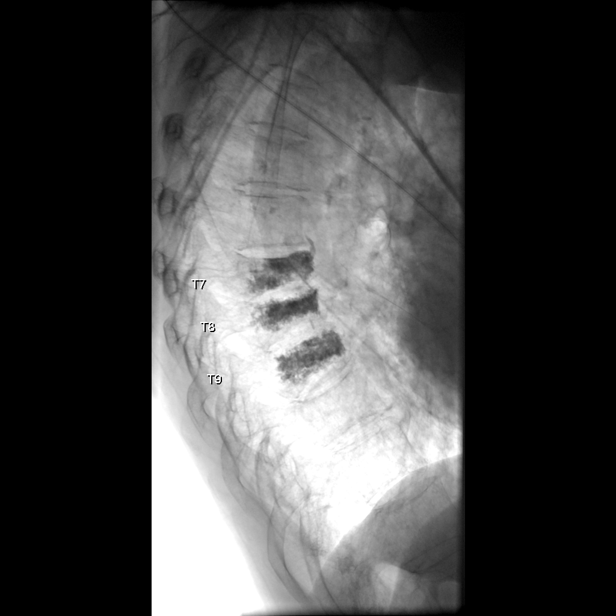
[im 31/34]
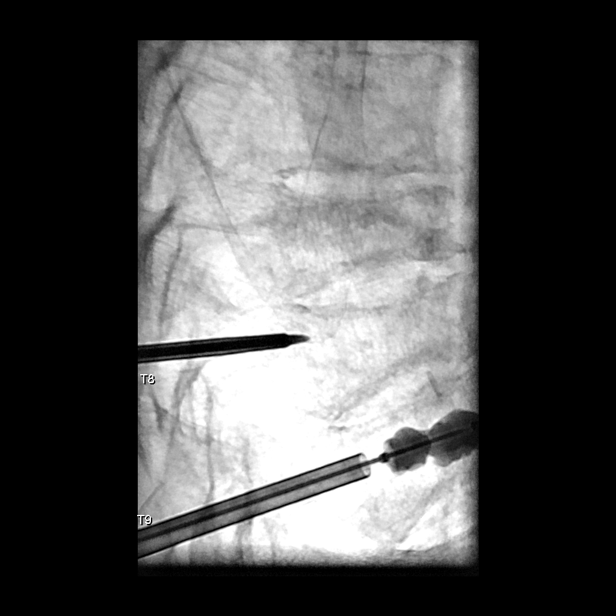
[im 34/34]
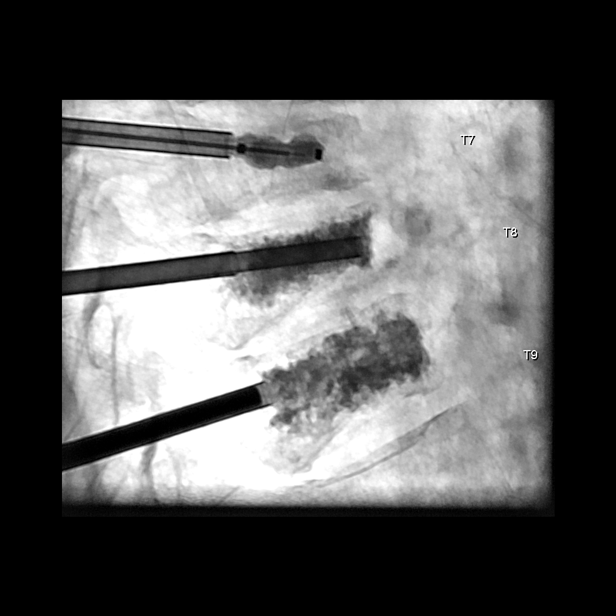

[13 of 24 positions shown; findings below may reference images not displayed]

MEDICATIONS:
As antibiotic prophylaxis, 2 g Ancef IV ordered pre-procedure and
administered intravenously within 1 hour of incision.

ANESTHESIA/SEDATION:
Moderate (conscious) sedation was employed during this procedure. A
total of Versed 4 mg and Fentanyl 100 mcg was administered
intravenously.

Moderate Sedation Time: 52 minutes. The patient's level of
consciousness and vital signs were monitored continuously by
radiology nursing throughout the procedure under my direct
supervision.

FLUOROSCOPY TIME:  Fluoroscopy Time: 22 minutes 42 seconds (1927
mGy)

COMPLICATIONS:
None immediate.

PROCEDURE:
Following a full explanation of the procedure along with the
potential associated complications, an informed witnessed consent
was obtained.

The patient was placed prone on the fluoroscopic table. The skin
overlying the thoracic region was then prepped and draped in the
usual sterile fashion. The right pedicle at T9, the left pedicle at
T8, and the right pedicle at T7 were then infiltrated with 0.25%
bupivacaine followed by the advancement of an 11-gauge Jamshidi
needle through the pedicles into the posterior one-third at 3
levels. These were then exchanged for a Kyphon advanced osteo
introducer system comprised of a working cannula and a Kyphon osteo
drill.

These combinations were then advanced over a Kyphon osteo bone pin
until the tips of the Kyphon osteo drill were in the posterior third
at T7, T8 and T9.

At this time, the bone pins were removed. In a medial trajectory,
the combinations were advanced until the tips of the working
cannulae were inside the posterior one-third at T7, T8 and T9.

Through the working cannulae, a Kyphon inflatable bone tamp 20 x 3
were advanced and positioned with the distal markers 5 mm from the
anterior aspect of T7, T8 and T9. Crossing of the midline was seen
on the AP projection. At this time, the balloons were expanded using
contrast via a Kyphon inflation syringe device via microtubing.

Inflations were continued until there was apposition with the
superior endplates.

At this time, methylmethacrylate mixture was reconstituted with
Tobramycin in the Kyphon bone mixing device system. This was then
loaded onto the Kyphon bone fillers.

The balloon were deflated and removed followed by the instillation
of 2-1/2 bone filler equivalents of methylmethacrylate mixture at
T9, 2 bone filler equivalents of methylmethacrylate mixture at T8,
and 2 bone filler equivalents of methylmethacrylate mixture at T7.
Crossing of the midline by methylmethacrylate mixture was seen at
each of these levels. No extravasation was noted in the disk spaces
or posteriorly into the spinal canal. Small focal venous
contamination was seen along the midline at the level of T7. This
remained stable throughout the procedure..

The working cannulae and the bone fillers were then retrieved and
removed. Hemostasis was achieved at the skin entry sites. The
patient tolerated the procedure well. There were no acute
complications.
IMPRESSION: 1. Status post vertebral body augmentation using balloon kyphoplasty
at T7, T8 and T9 as described without event.

## 2019-07-28 ENCOUNTER — Ambulatory Visit: Payer: PPO | Attending: Internal Medicine

## 2019-07-28 DIAGNOSIS — Z23 Encounter for immunization: Secondary | ICD-10-CM

## 2019-07-28 NOTE — Progress Notes (Signed)
   Covid-19 Vaccination Clinic  Name:  Nicole Preston    MRN: [id number] DOB: February 06, 1934  07/28/2019  [name] was observed post Covid-19 immunization for 15 minutes without incidence. She was provided with Vaccine Information Sheet and instruction to access the V-Safe system.   [name] was instructed to call 911 with any severe reactions post vaccine: Marland Kitchen Difficulty breathing  . Swelling of your face and throat  . A fast heartbeat  . A bad rash all over your body  . Dizziness and weakness    Immunizations Administered    Name Date Dose VIS Date Route   Pfizer COVID-19 Vaccine 07/28/2019  2:32 PM 0.3 mL 06/03/2019 Intramuscular   Manufacturer: [place]   Lot: [id number]   [place]: [id number]

## 2019-07-30 ENCOUNTER — Ambulatory Visit: Payer: PPO

## 2019-08-18 ENCOUNTER — Ambulatory Visit: Payer: PPO

## 2019-08-23 ENCOUNTER — Ambulatory Visit: Payer: PPO | Attending: Internal Medicine

## 2019-08-23 DIAGNOSIS — Z23 Encounter for immunization: Secondary | ICD-10-CM | POA: Insufficient documentation

## 2019-08-23 NOTE — Progress Notes (Signed)
   Covid-19 Vaccination Clinic  Name:  Nicole Preston    MRN: [id number] DOB: [birth year]  08/23/2019  [name] was observed post Covid-19 immunization for 15 minutes without incident. She was provided with Vaccine Information Sheet and instruction to access the V-Safe system.   [name] was instructed to call 911 with any severe reactions post vaccine: Marland Kitchen Difficulty breathing  . Swelling of face and throat  . A fast heartbeat  . A bad rash all over body  . Dizziness and weakness   Immunizations Administered    Name Date Dose VIS Date Route   Pfizer COVID-19 Vaccine 08/23/2019  9:46 AM 0.3 mL 06/03/2019 Intramuscular   Manufacturer: [place]   Lot: [id number]   [place]: [id number]

## 2019-09-14 ENCOUNTER — Other Ambulatory Visit: Payer: Self-pay

## 2019-09-14 ENCOUNTER — Encounter: Payer: Self-pay | Admitting: Gastroenterology

## 2019-09-14 ENCOUNTER — Ambulatory Visit: Payer: PPO | Admitting: Gastroenterology

## 2019-09-14 VITALS — BP 88/62 | HR 86 | Temp 97.6°F | Ht 64.0 in | Wt 139.0 lb

## 2019-09-14 DIAGNOSIS — Z86010 Personal history of colonic polyps: Secondary | ICD-10-CM

## 2019-09-14 DIAGNOSIS — R194 Change in bowel habit: Secondary | ICD-10-CM

## 2019-09-14 MED ORDER — SUPREP BOWEL PREP KIT 17.5-3.13-1.6 GM/177ML PO SOLN: 1.0000 | ORAL | 0 refills | Status: DC

## 2019-09-14 NOTE — Patient Instructions (Signed)
If you are age 84 or older, your body mass index should be between 23-30. Your Body mass index is 23.86 kg/m. If this is out of the aforementioned range listed, please consider follow up with your Primary Care Provider.  If you are age 68 or younger, your body mass index should be between 19-25. Your Body mass index is 23.86 kg/m. If this is out of the aformentioned range listed, please consider follow up with your Primary Care Provider.   You have been scheduled for a colonoscopy. Please follow written instructions given to you at your visit today.  Please pick up your prep supplies at the pharmacy within the next 1-3 days. If you use inhalers (even only as needed), please bring them with you on the day of your procedure.  Please remain dairy free for one week.  After one week you can start a fiber supplement.  Thank you for entrusting me with your care and choosing [facility].  Dr Ardis Hughs

## 2019-09-14 NOTE — Progress Notes (Signed)
Review of pertinent gastrointestinal problems: 1. History of adenomatous polyps: colonoscopy Dr. Deatra Ina 12/2011 found a 8mm polyp, was TVA by path.  He documented difficult angulation at the sigmoid. Post procedure she had significant abd pains, presented to ER CT showed thickened sigmoid with associated nearby free fluid, all c/w bowel injury. Was put on oral abx and pains improved after short time. 2.  Change in bowel habits, looser than usual stools, 2019 evaluation.  Lab tests including sed rate, GI pathogen panel thyroid testing, complete metabolic profile, CBC were all normal.  Offered colonoscopy however she preferred conservative with daily Imodium trial.  She was to follow-up November 2019.   HPI: This is a very pleasant 86 year old woman who is here with her husband today.  I last saw her about a year and a half ago.   Her weight is down 6 pounds since her last office visit here about a year and a half ago.  At that time we had discussed change in bowel habits, looser than usual stools.  I did a battery of blood test as well as stool testing and that was all negative, see that summarized above.  She started taking Imodium on a as needed basis.  She is still quite bothered by intermittent loose stools.  About 2 days/week she has significant watery diarrhea associate with urgency.  She does see a little bit of blood in her stool but usually is just on the toilet paper.  The other 5 days a week she sees rather scibbolous stools.  She is bothered by a lot of gassiness.  She wonders if lactose intolerance might be playing a role in some of her issues.   ROS: complete GI ROS as described in HPI, all other review negative.  Constitutional:  No unintentional weight loss   Past Medical History:  Diagnosis Date  . Arthritis   . COPD (chronic obstructive pulmonary disease) ([place])   . Hemorrhoids   . Hypertension   . Infection 02/2014   RT SHOULDER  . Osteopetrosis   . Osteoporosis   . Renal  cancer ([place]) 4/12   rt nephrectomy  . S/p nephrectomy     Past Surgical History:  Procedure Laterality Date  . ABDOMINAL HYSTERECTOMY    . ABDOMINAL SURGERY  2012   for renal carcinoma tumor  . BICEPT TENODESIS Right 03/21/2014   Procedure: BICEPS TENODESIS;  Surgeon: Meredith Pel, MD;  Location: [facility];  Service: Orthopedics;  Laterality: Right;  . COLONOSCOPY  12/29/2011   Procedure: COLONOSCOPY;  Surgeon: Inda Castle, MD;  Location: [facility];  Service: Endoscopy;  Laterality: N/A;  . EYE SURGERY Left    cataract surgery  . IR FLUORO GUIDED NEEDLE PLC ASPIRATION/INJECTION LOC  07/19/2018  . IR FLUORO GUIDED NEEDLE PLC ASPIRATION/INJECTION LOC  07/19/2018  . IR FLUORO GUIDED NEEDLE PLC ASPIRATION/INJECTION LOC  07/19/2018  . IR KYPHO EA ADDL LEVEL THORACIC OR LUMBAR  07/29/2018  . IR KYPHO EA ADDL LEVEL THORACIC OR LUMBAR  07/29/2018  . IR KYPHO THORACIC WITH BONE BIOPSY  07/29/2018  . IRRIGATION AND DEBRIDEMENT SHOULDER Right 03/21/2014   Procedure: IRRIGATION AND DEBRIDEMENT SHOULDER, BICEPS TENODESIS;  Surgeon: Meredith Pel, MD;  Location: [facility];  Service: Orthopedics;  Laterality: Right;  I&D RIGHT SHOULDER WITH BICEPS TENODESIS  . NEPHRECTOMY  2012   RT KIDNEY    Current Outpatient Medications  Medication Sig Dispense Refill  . amLODipine (NORVASC) 5 MG tablet TK 1 T PO D    .  benazepril (LOTENSIN) 20 MG tablet Take 20 mg by mouth daily.    . cholecalciferol (VITAMIN D) 1000 UNITS tablet Take 1,000 Units by mouth daily.     Marland Kitchen LYSINE PO Take 1 tablet by mouth daily as needed (mouth sores).     . benazepril (LOTENSIN) 10 MG tablet TK 1 T PO D     Current Facility-Administered Medications  Medication Dose Route Frequency Provider Last Rate Last Admin  . methylPREDNISolone acetate (DEPO-MEDROL) injection 80 mg  80 mg Other Once Magnus Sinning, MD        Allergies as of 09/14/2019  . (No Known Allergies)    Family History  Problem Relation Age of Onset  .  Kidney cancer Mother   . Melanoma Father   . Lung cancer Brother   . Colon cancer Neg Hx     Social History   Socioeconomic History  . Marital status: Married    Spouse name: Not on file  . Number of children: 5  . Years of education: Not on file  . Highest education level: Not on file  Occupational History  . Occupation: Retired   Tobacco Use  . Smoking status: Former Smoker    Packs/day: 1.00    Types: Cigarettes    Quit date: 12/29/1983    Years since quitting: 35.7  . Smokeless tobacco: Never Used  Substance and Sexual Activity  . Alcohol use: Yes    Alcohol/week: 14.0 standard drinks    Types: 14 Glasses of wine per week    Comment: 1-2 glasses of wine/day  . Drug use: No  . Sexual activity: Not on file  Other Topics Concern  . Not on file  Social History Narrative   Daily caffeine    Social Determinants of Health   Financial Resource Strain:   . Difficulty of Paying Living Expenses:   Food Insecurity:   . Worried About Charity fundraiser in the Last Year:   . Arboriculturist in the Last Year:   Transportation Needs:   . Film/video editor (Medical):   Marland Kitchen Lack of Transportation (Non-Medical):   Physical Activity:   . Days of Exercise per Week:   . Minutes of Exercise per Session:   Stress:   . Feeling of Stress :   Social Connections:   . Frequency of Communication with Friends and Family:   . Frequency of Social Gatherings with Friends and Family:   . Attends Religious Services:   . Active Member of Clubs or Organizations:   . Attends Archivist Meetings:   Marland Kitchen Marital Status:   Intimate Partner Violence:   . Fear of Current or Ex-Partner:   . Emotionally Abused:   Marland Kitchen Physically Abused:   . Sexually Abused:      Physical Exam: BP (!) 88/62 (BP Location: Left Arm, Patient Position: Sitting, Cuff Size: Normal)   Pulse 86   Temp 97.6 F (36.4 C) (Other (Comment))   Ht 5\' 4"  (1.626 m)   Wt 139 lb (63 kg)   BMI 23.86 kg/m   Constitutional: generally well-appearing Psychiatric: alert and oriented x3 Abdomen: soft, nontender, nondistended, no obvious ascites, no peritoneal signs, normal bowel sounds No peripheral edema noted in lower extremities  Assessment and plan: 86 y.o. female with change in bowel habits, personal history of advanced adenoma  It does seem possible that lactose intolerance might be playing somewhat of a role and so she is going to go dairy free for the  next week.  Following that she will try adding fiber supplements to her diet especially since she has rather scibbolous stools between her diarrhea episodes.  I recommended colonoscopy at her soonest convenience.  She has history of advanced adenoma, sigmoid stricture/angulation that perhaps is becoming symptomatic.  I see no need for any further blood tests or imaging studies prior to then.  Please see the "Patient Instructions" section for addition details about the plan.  Owens Loffler, MD [facility] 09/14/2019, 2:13 PM   Total time on date of encounter was 30 minutes (this included time spent preparing to see the patient reviewing records; obtaining and/or reviewing separately obtained history; performing a medically appropriate exam and/or evaluation; counseling and educating the patient and family if present; ordering medications, tests or procedures if applicable; and documenting clinical information in the health record).

## 2019-09-19 ENCOUNTER — Telehealth: Payer: Self-pay | Admitting: Gastroenterology

## 2019-09-19 NOTE — Telephone Encounter (Signed)
I have put a sample up front for pick up, patient aware.

## 2019-09-19 NOTE — Telephone Encounter (Signed)
Patient is calling states the prep sent to pharmacy is to expensive would like  An alternative.

## 2019-09-26 ENCOUNTER — Ambulatory Visit (AMBULATORY_SURGERY_CENTER): Payer: PPO | Admitting: Gastroenterology

## 2019-09-26 ENCOUNTER — Encounter: Payer: Self-pay | Admitting: Gastroenterology

## 2019-09-26 ENCOUNTER — Other Ambulatory Visit: Payer: Self-pay

## 2019-09-26 VITALS — BP 123/66 | HR 62 | Temp 97.1°F | Resp 15 | Ht 64.0 in | Wt 139.0 lb

## 2019-09-26 DIAGNOSIS — R197 Diarrhea, unspecified: Secondary | ICD-10-CM

## 2019-09-26 DIAGNOSIS — D122 Benign neoplasm of ascending colon: Secondary | ICD-10-CM

## 2019-09-26 DIAGNOSIS — D120 Benign neoplasm of cecum: Secondary | ICD-10-CM | POA: Diagnosis not present

## 2019-09-26 DIAGNOSIS — R194 Change in bowel habit: Secondary | ICD-10-CM | POA: Diagnosis not present

## 2019-09-26 MED ORDER — SODIUM CHLORIDE 0.9 % IV SOLN: 500.0000 mL | Freq: Once | INTRAVENOUS | Status: DC

## 2019-09-26 NOTE — Progress Notes (Signed)
PT taken to PACU. Monitors in place. VSS. Report given to RN. 

## 2019-09-26 NOTE — Progress Notes (Signed)
Called to room to assist during endoscopic procedure.  Patient ID and intended procedure confirmed with present staff. Received instructions for my participation in the procedure from the performing physician.  

## 2019-09-26 NOTE — Patient Instructions (Signed)
Handouts given:  Diverticulosis, Hemorrhoids, Polyps, Resume previous diet Continue current medications Await pathology results  Start trial of dairy free diet for one week  YOU HAD AN ENDOSCOPIC PROCEDURE TODAY AT [facility]:   Refer to the procedure report that was given to you for any specific questions about what was found during the examination.  If the procedure report does not answer your questions, please call your gastroenterologist to clarify.  If you requested that your care partner not be given the details of your procedure findings, then the procedure report has been included in a sealed envelope for you to review at your convenience later.  YOU SHOULD EXPECT: Some feelings of bloating in the abdomen. Passage of more gas than usual.  Walking can help get rid of the air that was put into your GI tract during the procedure and reduce the bloating. If you had a lower endoscopy (such as a colonoscopy or flexible sigmoidoscopy) you may notice spotting of blood in your stool or on the toilet paper. If you underwent a bowel prep for your procedure, you may not have a normal bowel movement for a few days.  Please Note:  You might notice some irritation and congestion in your nose or some drainage.  This is from the oxygen used during your procedure.  There is no need for concern and it should clear up in a day or so.  SYMPTOMS TO REPORT IMMEDIATELY:   Following lower endoscopy (colonoscopy or flexible sigmoidoscopy):  Excessive amounts of blood in the stool  Significant tenderness or worsening of abdominal pains  Swelling of the abdomen that is new, acute  Fever of 100F or higher   For urgent or emergent issues, a gastroenterologist can be reached at any hour by calling [phone]. Do not use MyChart messaging for urgent concerns.    DIET:  We do recommend a small meal at first, but then you may proceed to your regular diet.  Drink plenty of fluids but you  should avoid alcoholic beverages for 24 hours.  ACTIVITY:  You should plan to take it easy for the rest of today and you should NOT DRIVE or use heavy machinery until tomorrow (because of the sedation medicines used during the test).    FOLLOW UP: Our staff will call the number listed on your records 48-72 hours following your procedure to check on you and address any questions or concerns that you may have regarding the information given to you following your procedure. If we do not reach you, we will leave a message.  We will attempt to reach you two times.  During this call, we will ask if you have developed any symptoms of COVID 19. If you develop any symptoms (ie: fever, flu-like symptoms, shortness of breath, cough etc.) before then, please call [phone].  If you test positive for Covid 19 in the 2 weeks post procedure, please call and report this information to [place].    If any biopsies were taken you will be contacted by phone or by letter within the next 1-3 weeks.  Please call us at [phone] if you have not heard about the biopsies in 3 weeks.    SIGNATURES/CONFIDENTIALITY: You and/or your care partner have signed paperwork which will be entered into your electronic medical record.  These signatures attest to the fact that that the information above on your After Visit Summary has been reviewed and is understood.  Full responsibility of the confidentiality of this  discharge information lies with you and/or your care-partner. 

## 2019-09-26 NOTE — Op Note (Signed)
[facility] Patient Name: Nicole Preston Procedure Date: 09/26/2019 8:18 AM MRN: [id number] Endoscopist: Milus Banister , MD Age: 86 Referring MD:  Date of Birth: [birth year] Gender: Female Account #: [id number] Procedure:                Colonoscopy Indications:              Change in bowel habits, intermittent diarrhea;                            colonoscopy Dr. Deatra Ina 12/2011 found a 18mm polyp,                            was TVA by path. He documented difficult                            angulation at the sigmoid.Post procedure she had                            significant abd pains, presented to ER CT showed                            thickened sigmoid with associated nearby free                            fluid, all c/w bowel injury. Was put on oral abx                            and pains improved after short time. Medicines:                Monitored Anesthesia Care Procedure:                Pre-Anesthesia Assessment:                           - Prior to the procedure, a History and Physical                            was performed, and patient medications and                            allergies were reviewed. The patient's tolerance of                            previous anesthesia was also reviewed. The risks                            and benefits of the procedure and the sedation                            options and risks were discussed with the patient.                            All questions were answered, and informed consent  was obtained. Prior Anticoagulants: The patient has                            taken no previous anticoagulant or antiplatelet                            agents. ASA Grade Assessment: II - A patient with                            mild systemic disease. After reviewing the risks                            and benefits, the patient was deemed in                            satisfactory condition to undergo the  procedure.                           After obtaining informed consent, the colonoscope                            was passed under direct vision. Throughout the                            procedure, the patient's blood pressure, pulse, and                            oxygen saturations were monitored continuously. The                            Colonoscope was introduced through the anus and                            advanced to the the cecum, identified by                            appendiceal orifice and ileocecal valve. The                            colonoscopy was performed without difficulty. The                            patient tolerated the procedure well. The quality                            of the bowel preparation was good. The ileocecal                            valve, appendiceal orifice, and rectum were                            photographed. Scope In: 8:33:18 AM Scope Out: 8:49:00 AM Scope Withdrawal Time: 0 hours 8 minutes 43 seconds  Total Procedure Duration: 0 hours 15 minutes  42 seconds  Findings:                 Slightly protuberant but otherwise normal                            appendiceal orifice.                           Three sessile polyps were found in the ascending                            colon and cecum. The polyps were 3 to 4 mm in size.                            These polyps were removed with a cold snare.                            Resection and retrieval were complete.                           Biopsies for histology were taken with a cold                            forceps from the entire colon for evaluation of                            microscopic colitis.                           Internal hemorroids, external hemorrhoids. Unable                            to returoflex due to small rectal vault.                           Diverticulosis changes in the left colon associated                            with mucosal edema, luminal  tortuosity.                           The exam was otherwise without abnormality on                            direct views. Complications:            No immediate complications. Estimated blood loss:                            None. Estimated Blood Loss:     Estimated blood loss: none. Impression:               - Three 3 to 4 mm polyps in the ascending colon and                            in the cecum, removed  with a cold snare. Resected                            and retrieved.                           - Internal hemorroids, external hemorrhoids. Unable                            to returoflex due to small rectal vault.                           - Diverticulosis changes in the left colon                            associated with mucosal edema, luminal tortuosity.                           - Slightly protuberant but otherwise normal                            appendiceal orifice.                           - The examination was otherwise normal on direct                            and retroflexion views.                           - Biopsies were taken with a cold forceps from the                            entire colon for evaluation of microscopic colitis. Recommendation:           - Patient has a contact number available for                            emergencies. The signs and symptoms of potential                            delayed complications were discussed with the                            patient. Return to normal activities tomorrow.                            Written discharge instructions were provided to the                            patient.                           - Resume previous diet.                           - Continue  present medications.                           - Await pathology results.                           - Trial of dairy free diet for one week. Milus Banister, MD 09/26/2019 8:55:09 AM This report has been signed electronically.

## 2019-09-26 NOTE — Progress Notes (Signed)
Pt's states no medical or surgical changes since previsit or office visit. 

## 2019-09-28 ENCOUNTER — Telehealth: Payer: Self-pay

## 2019-09-28 ENCOUNTER — Telehealth: Payer: Self-pay | Admitting: *Deleted

## 2019-09-28 ENCOUNTER — Other Ambulatory Visit: Payer: Self-pay

## 2019-09-28 DIAGNOSIS — R140 Abdominal distension (gaseous): Secondary | ICD-10-CM

## 2019-09-28 NOTE — Telephone Encounter (Signed)
  Follow up Call-  Call back number 09/26/2019  Post procedure Call Back phone  # [phone]  Permission to leave phone message Yes  Some recent data might be hidden     Patient questions:  Do you have a fever, pain , or abdominal swelling? Yes.  pt. States her belly becomes distended every time she eats since the procedure. Pain Score  0 *  Have you tolerated food without any problems? No.  Pt states distention and loose stools occur each time she eats.  Says she started dairy free diet on 09/27/19 per d/c instructions.  Is concerned about the distention and frequent occurrence of loose stools.  Have you been able to return to your normal activities? Yes.    Do you have any questions about your discharge instructions: Diet   No. Medications  No. Follow up visit  No.  Do you have questions or concerns about your Care? No.  Actions: * If pain score is 4 or above: No action needed, pain <4.  1. Have you developed a fever since your procedure? no  2.   Have you had an respiratory symptoms (SOB or cough) since your procedure? no  3.   Have you tested positive for COVID 19 since your procedure no  4.   Have you had any family members/close contacts diagnosed with the COVID 19 since your procedure?  no   If yes to any of these questions please route to Joylene John, RN and Erenest Rasher, RN

## 2019-09-28 NOTE — Telephone Encounter (Signed)
Patty, Can you call her. She needs KUB flat and upright today or tomorrow (check for free air). If pains worsen she needs to go to the ER.    thanks

## 2019-09-28 NOTE — Telephone Encounter (Signed)
The pt has been advised and states that she will come in tomorrow for KUB.  Order has been placed.

## 2019-09-28 NOTE — Telephone Encounter (Signed)
Attempted f/u phone call. No answer. Left message. °

## 2019-09-29 ENCOUNTER — Other Ambulatory Visit: Payer: Self-pay

## 2019-09-29 ENCOUNTER — Ambulatory Visit (INDEPENDENT_AMBULATORY_CARE_PROVIDER_SITE_OTHER)
Admission: RE | Admit: 2019-09-29 | Discharge: 2019-09-29 | Disposition: A | Discharge status: Home / Self Care | Payer: PPO | Source: Ambulatory Visit | Attending: Gastroenterology | Admitting: Gastroenterology

## 2019-09-29 DIAGNOSIS — R140 Abdominal distension (gaseous): Secondary | ICD-10-CM | POA: Diagnosis not present

## 2019-10-03 ENCOUNTER — Telehealth: Payer: Self-pay | Admitting: Gastroenterology

## 2019-10-03 NOTE — Telephone Encounter (Signed)
Per Dr Ardis Hughs last note pt to call with and update;   "Please call the patient. There is a lot of air in her GI tract. Possibly residual from her colonoscopy. Let her know the polyps from her colon were typical adenomas. NO recall given her age. The random biopsies were all normal. Remind her about 'dairy free' trial and that she should call next week to report on that trial. Thanks"  Pt states she did a week without dairy and continues to have watery diarrhea.  She takes imodium prn and that works well for a few days.  Some days she goes once daily and others it is 3-4 times daily.  She forgot to mention she is on bactrim for a UTI and will finish Wed.  She does think that the diarrhea was present before the bactrim. Please advise.

## 2019-10-04 NOTE — Telephone Encounter (Signed)
Ok. Good to know that imodium works, just need to find a good regimen for it now.  I want her to take a 1/2 pill of imodium every single mornign on a scheduled basis, she can repeat that later in the day PRN.  OV with me next available.  Thanks

## 2019-10-04 NOTE — Telephone Encounter (Signed)
The pt has been advised of the scheduled imodium and appt has been made for follow up.  The pt has been advised of the information and verbalized understanding.

## 2019-10-04 NOTE — Telephone Encounter (Signed)
Left message on machine to call back  

## 2019-10-04 NOTE — Telephone Encounter (Signed)
Patient is returning your call.  

## 2019-10-12 DIAGNOSIS — S22000D Wedge compression fracture of unspecified thoracic vertebra, subsequent encounter for fracture with routine healing: Secondary | ICD-10-CM | POA: Diagnosis not present

## 2019-10-12 DIAGNOSIS — R809 Proteinuria, unspecified: Secondary | ICD-10-CM | POA: Diagnosis not present

## 2019-10-12 DIAGNOSIS — C649 Malignant neoplasm of unspecified kidney, except renal pelvis: Secondary | ICD-10-CM | POA: Diagnosis not present

## 2019-10-12 DIAGNOSIS — E559 Vitamin D deficiency, unspecified: Secondary | ICD-10-CM | POA: Diagnosis not present

## 2019-10-12 DIAGNOSIS — D126 Benign neoplasm of colon, unspecified: Secondary | ICD-10-CM | POA: Diagnosis not present

## 2019-10-12 DIAGNOSIS — N1831 Chronic kidney disease, stage 3a: Secondary | ICD-10-CM | POA: Diagnosis not present

## 2019-10-12 DIAGNOSIS — I129 Hypertensive chronic kidney disease with stage 1 through stage 4 chronic kidney disease, or unspecified chronic kidney disease: Secondary | ICD-10-CM | POA: Diagnosis not present

## 2019-10-12 DIAGNOSIS — E7800 Pure hypercholesterolemia, unspecified: Secondary | ICD-10-CM | POA: Diagnosis not present

## 2019-10-12 DIAGNOSIS — M810 Age-related osteoporosis without current pathological fracture: Secondary | ICD-10-CM | POA: Diagnosis not present

## 2019-10-12 DIAGNOSIS — M5136 Other intervertebral disc degeneration, lumbar region: Secondary | ICD-10-CM | POA: Diagnosis not present

## 2019-10-12 DIAGNOSIS — I700 Atherosclerosis of aorta: Secondary | ICD-10-CM | POA: Diagnosis not present

## 2019-10-31 ENCOUNTER — Telehealth: Payer: Self-pay | Admitting: Gastroenterology

## 2019-10-31 NOTE — Telephone Encounter (Signed)
The pt states she will try lactaid OTC and see if it makes a difference and she will also keep her follow up as scheduled

## 2019-11-03 ENCOUNTER — Telehealth: Payer: Self-pay | Admitting: Gastroenterology

## 2019-11-03 NOTE — Telephone Encounter (Signed)
Pt requested a call back to discuss results of pathology.  She had a colonoscopy 09/26/19.

## 2019-11-03 NOTE — Telephone Encounter (Signed)
The patient has been notified of this information and all questions answered. She was also advised that we spoke back in April.  She remembered and thanked me for calling her.   Nicole Preston Please call the patient. There is a lot of air in her GI tract. Possibly residual from her colonoscopy. Let her know the polyps from her colon were typical adenomas. NO recall given her age. The random biopsies were all normal. Remind her about 'dairy free' trial and that she should call next week to report on that trial. Thanks  Megan, no result note is necessary. Thanks.

## 2019-11-14 ENCOUNTER — Telehealth: Payer: Self-pay | Admitting: Gastroenterology

## 2019-11-14 NOTE — Telephone Encounter (Signed)
Appointment cancelled. Pt states she will call back to schedule an appt when/if she needs one.

## 2019-11-14 NOTE — Telephone Encounter (Signed)
Pt states that since she removed dairy from her diet, her sxs have resolved. She wants to know if she still needs to keep her appt on 6/1 or if she can cancel it. Pls call her.

## 2019-11-22 ENCOUNTER — Ambulatory Visit: Payer: PPO | Admitting: Gastroenterology

## 2019-11-23 ENCOUNTER — Other Ambulatory Visit: Payer: Self-pay

## 2019-11-23 ENCOUNTER — Ambulatory Visit (INDEPENDENT_AMBULATORY_CARE_PROVIDER_SITE_OTHER): Payer: PPO

## 2019-11-23 ENCOUNTER — Ambulatory Visit: Payer: PPO | Admitting: Orthopaedic Surgery

## 2019-11-23 DIAGNOSIS — M549 Dorsalgia, unspecified: Secondary | ICD-10-CM

## 2019-11-23 MED ORDER — GABAPENTIN 300 MG PO CAPS: 300.0000 mg | ORAL_CAPSULE | Freq: Every day | ORAL | 2 refills | Status: DC

## 2019-11-23 NOTE — Progress Notes (Signed)
Office Visit Note   Patient: Nicole Preston           Date of Birth: 1934-05-01           MRN: [id number] Visit Date: 11/23/2019              Requested by: Haywood Pao, MD [address] [place],  [place] [address] PCP: Haywood Pao, MD   Assessment & Plan: Visit Diagnoses:  1. Mid back pain     Plan: I did review her thoracic spine x-rays with her and compared these to previous films from over a year ago.  We see no significant change at all so I gave her reassurance and this gave her reassurance as well.  Certainly she could benefit from better posture and outpatient physical therapy if this becomes problematic.  She will let us know if she would like to have that set up for her.  I did refill her Neurontin.  All questions and concerns were answered and addressed.  Follow-up can be otherwise as needed.  Follow-Up Instructions: Return if symptoms worsen or fail to improve.   Orders:  Orders Placed This Encounter  Procedures  . XR Thoracic Spine 2 View   Meds ordered this encounter  Medications  . gabapentin (NEURONTIN) 300 MG capsule    Sig: Take 1 capsule (300 mg total) by mouth at bedtime.    Dispense:  90 capsule    Refill:  2      Procedures: No procedures performed   Clinical Data: No additional findings.   Subjective: Chief Complaint  Patient presents with  . Middle Back - Pain  The patient is a very pleasant 86 year old female well-known to me.  She has a history of kyphoplasties at 3 continuous levels of her mid thoracic spine that was done well over a year ago.  She has been having some mid back pain and want to make sure there was nothing going on different from her kyphoplasty's.  She does take Neurontin at night.  Overall she feels like she is doing well.  Her husband is with her does stated that her daughter noticed that she is not standing up with straight and sometimes has poor posture which could certainly contribute to continued thoracic  pain.  She denies any radicular symptoms.  She is walking without any type of assistive device.  She denies any acute change in her medical status.  HPI  Review of Systems She currently denies any headache, chest pain, short of breath, fever, chills, nausea, vomiting  Objective: Vital Signs: There were no vitals taken for this visit.  Physical Exam She is alert and orient x3 and in no acute distress Ortho Exam Examination of her thoracic spine shows some back pain to the left side at the mid thoracic spine but no significant deformities. Specialty Comments:  No specialty comments available.  Imaging: XR Thoracic Spine 2 View  Result Date: 11/23/2019 2 views of the thoracic spine show no acute findings.  There is evidence of previous kyphoplasty's of the mid thoracic spine at 3 levels that are contiguous.  Compared to films from over a year ago there has been no interval change.  There are no new compression deformities.    PMFS History: Patient Active Problem List   Diagnosis Date Noted  . AKI (acute kidney injury) ([facility]) 05/02/2016  . Acute cystitis 05/01/2016  . Dizziness 05/01/2016  . Colitis 04/30/2016  . Benign essential HTN 04/05/2014  . Superficial  injury of shoulder with infection 03/21/2014  . Colon polyp 01/02/2012  . Hemorrhage of rectum and anus 12/09/2011  . Renal cell carcinoma ([facility]) 12/09/2011   Past Medical History:  Diagnosis Date  . Arthritis   . COPD (chronic obstructive pulmonary disease) ([facility])   . Hemorrhoids   . Hypertension   . Infection 02/2014   RT SHOULDER  . Osteopetrosis   . Osteoporosis   . Renal cancer ([facility]) 4/12   rt nephrectomy  . S/p nephrectomy     Family History  Problem Relation Age of Onset  . Kidney cancer Mother   . Melanoma Father   . Lung cancer Brother   . Colon cancer Neg Hx     Past Surgical History:  Procedure Laterality Date  . ABDOMINAL HYSTERECTOMY    . ABDOMINAL SURGERY  2012   for renal carcinoma tumor  .  BICEPT TENODESIS Right 03/21/2014   Procedure: BICEPS TENODESIS;  Surgeon: Meredith Pel, MD;  Location: [facility];  Service: Orthopedics;  Laterality: Right;  . COLONOSCOPY  12/29/2011   Procedure: COLONOSCOPY;  Surgeon: Inda Castle, MD;  Location: [facility];  Service: Endoscopy;  Laterality: N/A;  . EYE SURGERY Left    cataract surgery  . IR FLUORO GUIDED NEEDLE PLC ASPIRATION/INJECTION LOC  07/19/2018  . IR FLUORO GUIDED NEEDLE PLC ASPIRATION/INJECTION LOC  07/19/2018  . IR FLUORO GUIDED NEEDLE PLC ASPIRATION/INJECTION LOC  07/19/2018  . IR KYPHO EA ADDL LEVEL THORACIC OR LUMBAR  07/29/2018  . IR KYPHO EA ADDL LEVEL THORACIC OR LUMBAR  07/29/2018  . IR KYPHO THORACIC WITH BONE BIOPSY  07/29/2018  . IRRIGATION AND DEBRIDEMENT SHOULDER Right 03/21/2014   Procedure: IRRIGATION AND DEBRIDEMENT SHOULDER, BICEPS TENODESIS;  Surgeon: Meredith Pel, MD;  Location: [facility];  Service: Orthopedics;  Laterality: Right;  I&D RIGHT SHOULDER WITH BICEPS TENODESIS  . NEPHRECTOMY  2012   RT KIDNEY   Social History   Occupational History  . Occupation: Retired   Tobacco Use  . Smoking status: Former Smoker    Packs/day: 1.00    Types: Cigarettes    Quit date: 12/29/1983    Years since quitting: 35.9  . Smokeless tobacco: Never Used  Substance and Sexual Activity  . Alcohol use: Yes    Alcohol/week: 14.0 standard drinks    Types: 14 Glasses of wine per week    Comment: 1-2 glasses of wine/day  . Drug use: No  . Sexual activity: Not on file

## 2019-12-27 ENCOUNTER — Telehealth: Payer: Self-pay | Admitting: Gastroenterology

## 2019-12-27 NOTE — Telephone Encounter (Signed)
Patient called states she received a bill\statement from [facility] saying her insurance is not par with them. She wold like to discuss because she said no one advised her of that therefore she would not have known prior to her procedure.

## 2019-12-29 NOTE — Telephone Encounter (Signed)
Will gather some information from anesthesia regarding this claim and then contact the patient.

## 2019-12-30 NOTE — Telephone Encounter (Signed)
Spoke with Osvaldo Angst CRNA this morning and he has contacted patient and is monitoring her billing concerns. Nothing further needed at this time.

## 2020-03-07 DIAGNOSIS — H52203 Unspecified astigmatism, bilateral: Secondary | ICD-10-CM | POA: Diagnosis not present

## 2020-03-07 DIAGNOSIS — H2511 Age-related nuclear cataract, right eye: Secondary | ICD-10-CM | POA: Diagnosis not present

## 2020-04-06 DIAGNOSIS — M810 Age-related osteoporosis without current pathological fracture: Secondary | ICD-10-CM | POA: Diagnosis not present

## 2020-04-06 DIAGNOSIS — E7800 Pure hypercholesterolemia, unspecified: Secondary | ICD-10-CM | POA: Diagnosis not present

## 2020-04-09 DIAGNOSIS — I10 Essential (primary) hypertension: Secondary | ICD-10-CM | POA: Diagnosis not present

## 2020-04-09 DIAGNOSIS — R82998 Other abnormal findings in urine: Secondary | ICD-10-CM | POA: Diagnosis not present

## 2020-04-13 DIAGNOSIS — F419 Anxiety disorder, unspecified: Secondary | ICD-10-CM | POA: Diagnosis not present

## 2020-04-13 DIAGNOSIS — E7800 Pure hypercholesterolemia, unspecified: Secondary | ICD-10-CM | POA: Diagnosis not present

## 2020-04-13 DIAGNOSIS — N1831 Chronic kidney disease, stage 3a: Secondary | ICD-10-CM | POA: Diagnosis not present

## 2020-04-13 DIAGNOSIS — J8410 Pulmonary fibrosis, unspecified: Secondary | ICD-10-CM | POA: Diagnosis not present

## 2020-04-13 DIAGNOSIS — J449 Chronic obstructive pulmonary disease, unspecified: Secondary | ICD-10-CM | POA: Diagnosis not present

## 2020-04-13 DIAGNOSIS — Z0000 Encounter for general adult medical examination without abnormal findings: Secondary | ICD-10-CM | POA: Diagnosis not present

## 2020-04-13 DIAGNOSIS — Z905 Acquired absence of kidney: Secondary | ICD-10-CM | POA: Diagnosis not present

## 2020-04-13 DIAGNOSIS — S22000A Wedge compression fracture of unspecified thoracic vertebra, initial encounter for closed fracture: Secondary | ICD-10-CM | POA: Diagnosis not present

## 2020-04-13 DIAGNOSIS — R809 Proteinuria, unspecified: Secondary | ICD-10-CM | POA: Diagnosis not present

## 2020-04-13 DIAGNOSIS — I700 Atherosclerosis of aorta: Secondary | ICD-10-CM | POA: Diagnosis not present

## 2020-04-13 DIAGNOSIS — I129 Hypertensive chronic kidney disease with stage 1 through stage 4 chronic kidney disease, or unspecified chronic kidney disease: Secondary | ICD-10-CM | POA: Diagnosis not present

## 2020-04-13 DIAGNOSIS — H259 Unspecified age-related cataract: Secondary | ICD-10-CM | POA: Diagnosis not present

## 2020-04-26 ENCOUNTER — Telehealth: Payer: Self-pay | Admitting: Physical Medicine and Rehabilitation

## 2020-04-26 NOTE — Telephone Encounter (Signed)
Patient had bilateral L5-S1 facets/MBB on 11/30/2018. She did see Dr. Ninfa Linden on 11/23/19 to discuss her back pain, but no referral for injection was mentioned. Please advise.

## 2020-04-26 NOTE — Telephone Encounter (Signed)
If back and hip and or leg then bilat L 5tf esi and I can talk about RFA again.  My notes say initially she had left radic type pain that resolved after L5 tf esi but had back that we did facet/MBB - if you can tell that second injection - the facet helped the can repeat that - either way need to eval at same time - 30 min

## 2020-04-26 NOTE — Telephone Encounter (Signed)
Patient called requesting a call back to set appt for back injection or hip pains. Please call patient at [phone].

## 2020-04-27 NOTE — Telephone Encounter (Signed)
Patient states she has pain in the soft tissue of the lower back and this is something new. Scheduled for an OV with Dr. Ninfa Linden to discuss.

## 2020-04-30 ENCOUNTER — Telehealth: Payer: Self-pay | Admitting: Orthopaedic Surgery

## 2020-04-30 NOTE — Telephone Encounter (Signed)
I have never prescribed more than just 300 mg at bedtime of Neurontin.  She is requesting to take 900 mg at bedtime.  She may need to check with her primary care physician about that because I am not sure as to that dosage not having prescribed that high amount at night time before.

## 2020-04-30 NOTE — Telephone Encounter (Signed)
She states you were the one that told her to take 3 at a time at times

## 2020-04-30 NOTE — Telephone Encounter (Signed)
3 100mg  tabs qhs

## 2020-04-30 NOTE — Telephone Encounter (Signed)
Patient called asked if the dosage for the Rx Gabapentin 300 mg could be written for her to take 3 tabs a night instead of 1 tab a night. Patient also asked if she could get a 90 day supply.  Patient asked if the Rx needed to be changed to 1 tab a night could she can get a call back explaining it to her why it was changed to1 tab a night.    The number to contact patient is [phone]

## 2020-05-01 ENCOUNTER — Other Ambulatory Visit: Payer: Self-pay

## 2020-05-01 MED ORDER — GABAPENTIN 300 MG PO CAPS: 300.0000 mg | ORAL_CAPSULE | Freq: Every day | ORAL | 2 refills | Status: DC

## 2020-05-02 ENCOUNTER — Ambulatory Visit: Payer: PPO | Admitting: Orthopaedic Surgery

## 2020-10-17 DIAGNOSIS — Z1331 Encounter for screening for depression: Secondary | ICD-10-CM | POA: Diagnosis not present

## 2020-10-17 DIAGNOSIS — I700 Atherosclerosis of aorta: Secondary | ICD-10-CM | POA: Diagnosis not present

## 2020-10-17 DIAGNOSIS — J449 Chronic obstructive pulmonary disease, unspecified: Secondary | ICD-10-CM | POA: Diagnosis not present

## 2020-10-17 DIAGNOSIS — M5136 Other intervertebral disc degeneration, lumbar region: Secondary | ICD-10-CM | POA: Diagnosis not present

## 2020-10-17 DIAGNOSIS — F419 Anxiety disorder, unspecified: Secondary | ICD-10-CM | POA: Diagnosis not present

## 2020-10-17 DIAGNOSIS — Z905 Acquired absence of kidney: Secondary | ICD-10-CM | POA: Diagnosis not present

## 2020-10-17 DIAGNOSIS — N1831 Chronic kidney disease, stage 3a: Secondary | ICD-10-CM | POA: Diagnosis not present

## 2020-10-17 DIAGNOSIS — I129 Hypertensive chronic kidney disease with stage 1 through stage 4 chronic kidney disease, or unspecified chronic kidney disease: Secondary | ICD-10-CM | POA: Diagnosis not present

## 2020-10-17 DIAGNOSIS — S22000A Wedge compression fracture of unspecified thoracic vertebra, initial encounter for closed fracture: Secondary | ICD-10-CM | POA: Diagnosis not present

## 2020-10-17 DIAGNOSIS — E7800 Pure hypercholesterolemia, unspecified: Secondary | ICD-10-CM | POA: Diagnosis not present

## 2020-10-17 DIAGNOSIS — J8410 Pulmonary fibrosis, unspecified: Secondary | ICD-10-CM | POA: Diagnosis not present

## 2020-10-17 DIAGNOSIS — Z1339 Encounter for screening examination for other mental health and behavioral disorders: Secondary | ICD-10-CM | POA: Diagnosis not present

## 2020-10-24 DIAGNOSIS — M5136 Other intervertebral disc degeneration, lumbar region: Secondary | ICD-10-CM | POA: Diagnosis not present

## 2020-10-24 DIAGNOSIS — S29011A Strain of muscle and tendon of front wall of thorax, initial encounter: Secondary | ICD-10-CM | POA: Diagnosis not present

## 2021-01-04 ENCOUNTER — Telehealth: Payer: Self-pay | Admitting: Orthopaedic Surgery

## 2021-01-04 ENCOUNTER — Other Ambulatory Visit: Payer: Self-pay | Admitting: Orthopaedic Surgery

## 2021-01-04 MED ORDER — GABAPENTIN 300 MG PO CAPS: 600.0000 mg | ORAL_CAPSULE | Freq: Every day | ORAL | 2 refills | Status: DC

## 2021-01-04 NOTE — Telephone Encounter (Signed)
Please advise 

## 2021-01-04 NOTE — Telephone Encounter (Signed)
Pt called asking for a refill on her gabapentin rx and she would like the rx to be changed from 1 tablet a night to 2. Pt hasn't been seen since 11/2019 and wanted to know if she will need to be seen in office first? She states she had to go out of town for her husbands health needs and believes the plane ride bothered her back .  [phone]

## 2021-03-26 DIAGNOSIS — H52203 Unspecified astigmatism, bilateral: Secondary | ICD-10-CM | POA: Diagnosis not present

## 2021-03-26 DIAGNOSIS — H2511 Age-related nuclear cataract, right eye: Secondary | ICD-10-CM | POA: Diagnosis not present

## 2021-04-10 DIAGNOSIS — I10 Essential (primary) hypertension: Secondary | ICD-10-CM | POA: Diagnosis not present

## 2021-04-10 DIAGNOSIS — E7800 Pure hypercholesterolemia, unspecified: Secondary | ICD-10-CM | POA: Diagnosis not present

## 2021-04-10 DIAGNOSIS — E559 Vitamin D deficiency, unspecified: Secondary | ICD-10-CM | POA: Diagnosis not present

## 2021-04-17 DIAGNOSIS — S22000D Wedge compression fracture of unspecified thoracic vertebra, subsequent encounter for fracture with routine healing: Secondary | ICD-10-CM | POA: Diagnosis not present

## 2021-04-17 DIAGNOSIS — Z1331 Encounter for screening for depression: Secondary | ICD-10-CM | POA: Diagnosis not present

## 2021-04-17 DIAGNOSIS — I700 Atherosclerosis of aorta: Secondary | ICD-10-CM | POA: Diagnosis not present

## 2021-04-17 DIAGNOSIS — R82998 Other abnormal findings in urine: Secondary | ICD-10-CM | POA: Diagnosis not present

## 2021-04-17 DIAGNOSIS — I129 Hypertensive chronic kidney disease with stage 1 through stage 4 chronic kidney disease, or unspecified chronic kidney disease: Secondary | ICD-10-CM | POA: Diagnosis not present

## 2021-04-17 DIAGNOSIS — F5102 Adjustment insomnia: Secondary | ICD-10-CM | POA: Diagnosis not present

## 2021-04-17 DIAGNOSIS — E7800 Pure hypercholesterolemia, unspecified: Secondary | ICD-10-CM | POA: Diagnosis not present

## 2021-04-17 DIAGNOSIS — J449 Chronic obstructive pulmonary disease, unspecified: Secondary | ICD-10-CM | POA: Diagnosis not present

## 2021-04-17 DIAGNOSIS — M5136 Other intervertebral disc degeneration, lumbar region: Secondary | ICD-10-CM | POA: Diagnosis not present

## 2021-04-17 DIAGNOSIS — D692 Other nonthrombocytopenic purpura: Secondary | ICD-10-CM | POA: Diagnosis not present

## 2021-04-17 DIAGNOSIS — Z23 Encounter for immunization: Secondary | ICD-10-CM | POA: Diagnosis not present

## 2021-04-17 DIAGNOSIS — J8410 Pulmonary fibrosis, unspecified: Secondary | ICD-10-CM | POA: Diagnosis not present

## 2021-04-17 DIAGNOSIS — Z905 Acquired absence of kidney: Secondary | ICD-10-CM | POA: Diagnosis not present

## 2021-04-17 DIAGNOSIS — Z1339 Encounter for screening examination for other mental health and behavioral disorders: Secondary | ICD-10-CM | POA: Diagnosis not present

## 2021-04-17 DIAGNOSIS — F419 Anxiety disorder, unspecified: Secondary | ICD-10-CM | POA: Diagnosis not present

## 2021-04-17 DIAGNOSIS — N1831 Chronic kidney disease, stage 3a: Secondary | ICD-10-CM | POA: Diagnosis not present

## 2021-05-09 ENCOUNTER — Other Ambulatory Visit: Payer: Self-pay | Admitting: Orthopaedic Surgery

## 2021-05-09 ENCOUNTER — Telehealth: Payer: Self-pay | Admitting: Orthopaedic Surgery

## 2021-05-09 MED ORDER — GABAPENTIN 300 MG PO CAPS: 600.0000 mg | ORAL_CAPSULE | Freq: Every day | ORAL | 2 refills | Status: DC

## 2021-05-09 NOTE — Telephone Encounter (Signed)
Please advise 

## 2021-05-09 NOTE — Telephone Encounter (Signed)
Patient called. She would like gabapentin called in. Her call back number is [phone]

## 2021-05-23 DIAGNOSIS — H2511 Age-related nuclear cataract, right eye: Secondary | ICD-10-CM | POA: Diagnosis not present

## 2021-05-23 DIAGNOSIS — H25811 Combined forms of age-related cataract, right eye: Secondary | ICD-10-CM | POA: Diagnosis not present

## 2021-05-29 ENCOUNTER — Other Ambulatory Visit: Payer: Self-pay

## 2021-05-29 ENCOUNTER — Ambulatory Visit (INDEPENDENT_AMBULATORY_CARE_PROVIDER_SITE_OTHER): Payer: PPO | Admitting: Orthopaedic Surgery

## 2021-05-29 ENCOUNTER — Ambulatory Visit (INDEPENDENT_AMBULATORY_CARE_PROVIDER_SITE_OTHER): Payer: PPO

## 2021-05-29 DIAGNOSIS — G8929 Other chronic pain: Secondary | ICD-10-CM | POA: Diagnosis not present

## 2021-05-29 DIAGNOSIS — M545 Low back pain, unspecified: Secondary | ICD-10-CM

## 2021-05-29 NOTE — Progress Notes (Signed)
The patient is an 87 year old female well-known to me.  She comes in with history of low back pain for about 2 months now and she points to the lower aspect of her lumbar spine.  It is just in the midline and medial lateral.  There is no radicular component.  She has a history of kyphoplasties at 3 different levels of the thoracic spine but never the lumbar spine.  She says most of her pain is when she is just getting out of bed and going down into the bed.  Also hurts with sneezing and coughing and not lower aspect of her spine.  There is no change in bowel or bladder function.  She does not walk with assistive ice.  She does workout regularly but is try to take some time off from this.  She denies any specific injury.  She does have some pain to palpation of the lower lumbar spine right at the bottom but there is no positive straight leg raise at all and she has good strength in her lower extremities.  An AP and lateral lumbar spine shows degenerative changes at the lower lumbar spine and there may be slight compression deformity which is subacute at L5.  I went over her x-rays with her and we will have her just rest her spine.  It is feeling better today and I think this is going to continue to improve.  She will avoid heavy lifting and exercising.  I would like to see her back in about 2 months with a repeat standing AP and lateral lumbar spine.  If things worsen obviously she will let us know.

## 2021-07-02 ENCOUNTER — Telehealth: Payer: Self-pay | Admitting: Orthopaedic Surgery

## 2021-07-02 DIAGNOSIS — G8929 Other chronic pain: Secondary | ICD-10-CM

## 2021-07-02 NOTE — Telephone Encounter (Signed)
Please advise 

## 2021-07-02 NOTE — Telephone Encounter (Signed)
Patient called stating she is in a great deal of pain in her lower back. Patient asked if Dr Ninfa Linden thought she may need a Ct or MRI scan. Patient said she is mostly chair bound and when she goes to bed she have to roll out of bed in the morning.  The number to contact patient is [phone]

## 2021-07-02 NOTE — Telephone Encounter (Signed)
Called and advised pt. She stated understanding  

## 2021-07-02 NOTE — Telephone Encounter (Signed)
Mri ordered 

## 2021-07-03 ENCOUNTER — Other Ambulatory Visit: Payer: Self-pay

## 2021-07-03 ENCOUNTER — Ambulatory Visit
Admission: RE | Admit: 2021-07-03 | Discharge: 2021-07-03 | Disposition: A | Discharge status: Home / Self Care | Payer: PPO | Source: Ambulatory Visit | Attending: Orthopaedic Surgery | Admitting: Orthopaedic Surgery

## 2021-07-03 DIAGNOSIS — M545 Low back pain, unspecified: Secondary | ICD-10-CM | POA: Diagnosis not present

## 2021-07-11 ENCOUNTER — Ambulatory Visit (INDEPENDENT_AMBULATORY_CARE_PROVIDER_SITE_OTHER): Payer: PPO

## 2021-07-11 ENCOUNTER — Ambulatory Visit: Payer: PPO | Admitting: Orthopaedic Surgery

## 2021-07-11 ENCOUNTER — Encounter: Payer: Self-pay | Admitting: Orthopaedic Surgery

## 2021-07-11 DIAGNOSIS — S32030D Wedge compression fracture of third lumbar vertebra, subsequent encounter for fracture with routine healing: Secondary | ICD-10-CM | POA: Diagnosis not present

## 2021-07-11 DIAGNOSIS — M545 Low back pain, unspecified: Secondary | ICD-10-CM | POA: Diagnosis not present

## 2021-07-11 DIAGNOSIS — G8929 Other chronic pain: Secondary | ICD-10-CM

## 2021-07-11 DIAGNOSIS — S32010A Wedge compression fracture of first lumbar vertebra, initial encounter for closed fracture: Secondary | ICD-10-CM | POA: Diagnosis not present

## 2021-07-11 MED ORDER — GABAPENTIN 300 MG PO CAPS: 600.0000 mg | ORAL_CAPSULE | Freq: Every day | ORAL | 2 refills | Status: DC

## 2021-07-11 MED ORDER — BACLOFEN 10 MG PO TABS: 10.0000 mg | ORAL_TABLET | Freq: Two times a day (BID) | ORAL | 0 refills | Status: DC | PRN

## 2021-07-11 NOTE — Progress Notes (Signed)
The patient comes in today to go over MRI of her lumbar spine and to get new standing plain films of the lumbar spine.  She is 88 years old and does have a history of old compression fractures in her thoracic and lumbar spine.  She has had at least 1 vertebroplasty or kyphoplasty of the thoracic spine.  When we saw her last month, she was having more significant pain.  That is subsiding some but she still having pain.  She says Neurontin on occasion helps her quite a bit but she does not take it every day.  She did need refill of Neurontin which I did provide.  She reports more of spasms to the left side of her upper lumbar spine.  She does stand better today and has some pain with flexion extension of the lumbar spine.  NuPrep 2 views lumbar spine show compression deformities at several levels.  I did not see significant loss of height.  The MRI is reviewed with her as well.  There is edema in L1 and L3.  There is about 20 to 30% loss of height of those bodies.  She is doing better overall and she would like to hold off on any type of referral to interventional radiology since she is making some progress.  I will send in some baclofen for spasms and I would like to see her back in 6 weeks to see how she is doing overall.  We will only need a lateral x-ray of her lumbar spine if her pain is worsening.  She can also call us if her pain is worsening because I would then send her for an IR consultation for an intervention in her spine.

## 2021-07-18 ENCOUNTER — Telehealth: Payer: Self-pay

## 2021-07-18 ENCOUNTER — Other Ambulatory Visit: Payer: Self-pay

## 2021-07-18 ENCOUNTER — Other Ambulatory Visit: Payer: Self-pay | Admitting: Orthopaedic Surgery

## 2021-07-18 MED ORDER — ONDANSETRON HCL 4 MG PO TABS: 4.0000 mg | ORAL_TABLET | Freq: Three times a day (TID) | ORAL | 0 refills | Status: DC | PRN

## 2021-07-18 MED ORDER — ONDANSETRON HCL 4 MG PO TABS
4.0000 mg | ORAL_TABLET | Freq: Three times a day (TID) | ORAL | 0 refills | Status: DC | PRN
Start: 2021-07-18 — End: 2021-07-18

## 2021-07-18 NOTE — Telephone Encounter (Signed)
Pt called into the office stating that she is vomiting. She is stating that she did take a glass of wine about a hour before she took her muscle relaxer and she is stating that she tried taking it again with a warm coke and couldn't keep that down either.   She wanted to know what she should do. Please advise

## 2021-07-18 NOTE — Telephone Encounter (Signed)
Patient aware of the below message  Her pharmacy is closed today, system is down, will try to call this in tomorrow

## 2021-07-19 ENCOUNTER — Other Ambulatory Visit: Payer: Self-pay

## 2021-07-19 MED ORDER — ONDANSETRON HCL 4 MG PO TABS: 4.0000 mg | ORAL_TABLET | Freq: Three times a day (TID) | ORAL | 0 refills | Status: DC | PRN

## 2021-07-19 NOTE — Telephone Encounter (Signed)
Sent in to pharmacy.  

## 2021-07-22 ENCOUNTER — Other Ambulatory Visit: Payer: Self-pay

## 2021-07-22 MED ORDER — ONDANSETRON HCL 4 MG PO TABS: 4.0000 mg | ORAL_TABLET | Freq: Three times a day (TID) | ORAL | 0 refills | Status: DC | PRN

## 2021-07-29 ENCOUNTER — Other Ambulatory Visit: Payer: Self-pay | Admitting: Physician Assistant

## 2021-07-29 ENCOUNTER — Telehealth: Payer: Self-pay | Admitting: Orthopaedic Surgery

## 2021-07-29 MED ORDER — BACLOFEN 10 MG PO TABS: 10.0000 mg | ORAL_TABLET | Freq: Two times a day (BID) | ORAL | 0 refills | Status: DC | PRN

## 2021-07-29 NOTE — Telephone Encounter (Signed)
Called for refill on baclofen

## 2021-07-29 NOTE — Telephone Encounter (Signed)
Please advise 

## 2021-07-30 ENCOUNTER — Ambulatory Visit: Payer: PPO | Admitting: Orthopaedic Surgery

## 2021-07-31 ENCOUNTER — Ambulatory Visit: Payer: PPO | Admitting: Orthopaedic Surgery

## 2021-08-22 ENCOUNTER — Ambulatory Visit: Payer: PPO | Admitting: Orthopaedic Surgery

## 2021-08-26 ENCOUNTER — Ambulatory Visit (INDEPENDENT_AMBULATORY_CARE_PROVIDER_SITE_OTHER): Payer: PPO

## 2021-08-26 ENCOUNTER — Ambulatory Visit: Payer: PPO | Admitting: Physician Assistant

## 2021-08-26 ENCOUNTER — Encounter: Payer: Self-pay | Admitting: Physician Assistant

## 2021-08-26 DIAGNOSIS — M549 Dorsalgia, unspecified: Secondary | ICD-10-CM | POA: Diagnosis not present

## 2021-08-26 MED ORDER — GABAPENTIN 300 MG PO CAPS: 600.0000 mg | ORAL_CAPSULE | Freq: Every day | ORAL | 2 refills | Status: AC

## 2021-08-26 MED ORDER — BACLOFEN 10 MG PO TABS: 10.0000 mg | ORAL_TABLET | Freq: Two times a day (BID) | ORAL | 0 refills | Status: DC | PRN

## 2021-08-26 NOTE — Addendum Note (Signed)
Addended by: Robyne Peers on: 08/26/2021 03:33 PM ? ? Modules accepted: Orders ? ?

## 2021-08-26 NOTE — Progress Notes (Signed)
? ?Office Visit Note ?  ?Patient: Nicole Preston           ?Date of Birth: 1934/04/18           ?MRN: [id number] ?Visit Date: 08/26/2021 ?             ?Requested by: Haywood Pao, MD ?[address] ?[place],  [place] [address] ?PCP: Haywood Pao, MD ? ? ?Assessment & Plan: ?Visit Diagnoses:  ?1. Mid back pain   ? ? ?Plan: Send her for kyphoplasty at T10.  Refill on her Neurontin was given.  She will follow-up with [place] as needed. ? ?Follow-Up Instructions: Return if symptoms worsen or fail to improve.  ? ?Orders:  ?Orders Placed This Encounter  ?Procedures  ? XR Thoracic Spine 2 View  ? ?Meds ordered this encounter  ?Medications  ? gabapentin (NEURONTIN) 300 MG capsule  ?  Sig: Take 2 capsules (600 mg total) by mouth at bedtime.  ?  Dispense:  90 capsule  ?  Refill:  2  ? baclofen (LIORESAL) 10 MG tablet  ?  Sig: Take 1 tablet (10 mg total) by mouth 2 (two) times daily as needed for muscle spasms.  ?  Dispense:  40 each  ?  Refill:  0  ? ? ? ? Procedures: ?No procedures performed ? ? ?Clinical Data: ?No additional findings. ? ? ?Subjective: ?Chief Complaint  ?Patient presents with  ? Middle Back - Pain  ? ? ?HPI ?Nicole Preston returns today for follow-up of her low back pain.  She states low back pain is much better.  Main complaint now is lower thoracic spine pain.  She is unable to walk long distances due to the pain.  She states that she felt something happen in her back when she was walking uphill about a month ago.  She has had previous kyphoplasty's at T 7 through T9.  Denies any radicular symptoms down either leg. ?Review of Systems ?Please see HPI ? ?Objective: ?Vital Signs: There were no vitals taken for this visit. ? ?Physical Exam ?Constitutional:   ?   Appearance: She is not ill-appearing or diaphoretic.  ?Pulmonary:  ?   Effort: Pulmonary effort is normal.  ?Neurological:  ?   Mental Status: She is alert.  ?Psychiatric:     ?   Mood and Affect: Mood normal.  ? ? ?Ortho Exam ?Thoracic spine:  Tenderness over the mid lower thoracic spine maximally over the area of T9-T10. ?Specialty Comments:  ?No specialty comments available. ? ?Imaging: ?XR Thoracic Spine 2 View ? ?Result Date: 08/26/2021 ?Thoracic spine 2 views: No change in the T7-T9 kyphoplasties.  Appears to be a new compression fracture at T10.  No other fractures identified no other acute findings.  ? ? ?PMFS History: ?Patient Active Problem List  ? Diagnosis Date Noted  ? AKI (acute kidney injury) ([facility]) 05/02/2016  ? Acute cystitis 05/01/2016  ? Dizziness 05/01/2016  ? Colitis 04/30/2016  ? Benign essential HTN 04/05/2014  ? Superficial injury of shoulder with infection 03/21/2014  ? Colon polyp 01/02/2012  ? Hemorrhage of rectum and anus 12/09/2011  ? Renal cell carcinoma ([facility]) 12/09/2011  ? ?Past Medical History:  ?Diagnosis Date  ? Arthritis   ? COPD (chronic obstructive pulmonary disease) ([facility])   ? Hemorrhoids   ? Hypertension   ? Infection 02/2014  ? RT SHOULDER  ? Osteopetrosis   ? Osteoporosis   ? Renal cancer ([facility]) 4/12  ? rt nephrectomy  ? S/p  nephrectomy   ?  ?Family History  ?Problem Relation Age of Onset  ? Kidney cancer Mother   ? Melanoma Father   ? Lung cancer Brother   ? Colon cancer Neg Hx   ?  ?Past Surgical History:  ?Procedure Laterality Date  ? ABDOMINAL HYSTERECTOMY    ? ABDOMINAL SURGERY  2012  ? for renal carcinoma tumor  ? BICEPT TENODESIS Right 03/21/2014  ? Procedure: BICEPS TENODESIS;  Surgeon: Meredith Pel, MD;  Location: [facility];  Service: Orthopedics;  Laterality: Right;  ? COLONOSCOPY  12/29/2011  ? Procedure: COLONOSCOPY;  Surgeon: Inda Castle, MD;  Location: [facility];  Service: Endoscopy;  Laterality: N/A;  ? EYE SURGERY Left   ? cataract surgery  ? IR FLUORO GUIDED NEEDLE PLC ASPIRATION/INJECTION LOC  07/19/2018  ? IR FLUORO GUIDED NEEDLE PLC ASPIRATION/INJECTION LOC  07/19/2018  ? IR FLUORO GUIDED NEEDLE PLC ASPIRATION/INJECTION LOC  07/19/2018  ? IR KYPHO EA ADDL LEVEL THORACIC OR LUMBAR  07/29/2018  ? IR  KYPHO EA ADDL LEVEL THORACIC OR LUMBAR  07/29/2018  ? IR KYPHO THORACIC WITH BONE BIOPSY  07/29/2018  ? IRRIGATION AND DEBRIDEMENT SHOULDER Right 03/21/2014  ? Procedure: IRRIGATION AND DEBRIDEMENT SHOULDER, BICEPS TENODESIS;  Surgeon: Meredith Pel, MD;  Location: [facility];  Service: Orthopedics;  Laterality: Right;  I&D RIGHT SHOULDER WITH BICEPS TENODESIS  ? NEPHRECTOMY  2012  ? RT KIDNEY  ? ?Social History  ? ?Occupational History  ? Occupation: Retired   ?Tobacco Use  ? Smoking status: Former  ?  Packs/day: 1.00  ?  Types: Cigarettes  ?  Quit date: 12/29/1983  ?  Years since quitting: 37.6  ? Smokeless tobacco: Never  ?Substance and Sexual Activity  ? Alcohol use: Yes  ?  Alcohol/week: 14.0 standard drinks  ?  Types: 14 Glasses of wine per week  ?  Comment: 1-2 glasses of wine/day  ? Drug use: No  ? Sexual activity: Not on file  ? ? ? ? ? ? ?

## 2021-08-28 DIAGNOSIS — Z961 Presence of intraocular lens: Secondary | ICD-10-CM | POA: Diagnosis not present

## 2021-08-29 ENCOUNTER — Telehealth: Payer: Self-pay

## 2021-08-29 ENCOUNTER — Telehealth (HOSPITAL_COMMUNITY): Payer: Self-pay

## 2021-08-29 ENCOUNTER — Other Ambulatory Visit: Payer: Self-pay

## 2021-08-29 DIAGNOSIS — M549 Dorsalgia, unspecified: Secondary | ICD-10-CM

## 2021-08-29 NOTE — Telephone Encounter (Signed)
Patient aware referral has been made for MRI ?

## 2021-08-29 NOTE — Telephone Encounter (Signed)
Received a referral for kyphoplasty. The pt has only had an xray and will need an mr thoracic. Left vm with ortho care to order an mri. AW ?

## 2021-08-29 NOTE — Telephone Encounter (Signed)
Nicole Preston with Dr. Arlean Hopping office at [facility] stated that patient needs to have an MRI Thoracic, before being seen.  Stated that patient has only had an x-ray.  CB# [phone].  Please advise.  Thank you. ?

## 2021-09-02 ENCOUNTER — Telehealth: Payer: Self-pay | Admitting: Orthopaedic Surgery

## 2021-09-02 NOTE — Telephone Encounter (Signed)
Pt called stating she not longer need an MRI or to see a specialist for her back spasms. She states they are not that bad and if pain worsens she will call to reschedule. No call back needed. ?

## 2021-09-11 ENCOUNTER — Telehealth: Payer: Self-pay | Admitting: Orthopaedic Surgery

## 2021-09-11 DIAGNOSIS — G8929 Other chronic pain: Secondary | ICD-10-CM

## 2021-09-11 NOTE — Telephone Encounter (Signed)
Pt called. She states that she is having the back spasm and would like to go ahead and get that MRI ordered.  ? ?CB [phone]  ?

## 2021-09-11 NOTE — Telephone Encounter (Signed)
noted 

## 2021-09-11 NOTE — Telephone Encounter (Signed)
Please advise 

## 2021-09-11 NOTE — Telephone Encounter (Signed)
Mri ordered.pt was informed  ?

## 2021-09-16 ENCOUNTER — Ambulatory Visit
Admission: RE | Admit: 2021-09-16 | Discharge: 2021-09-16 | Disposition: A | Discharge status: Home / Self Care | Payer: PPO | Source: Ambulatory Visit | Attending: Orthopaedic Surgery | Admitting: Orthopaedic Surgery

## 2021-09-16 ENCOUNTER — Other Ambulatory Visit: Payer: Self-pay

## 2021-09-16 ENCOUNTER — Ambulatory Visit
Admission: RE | Admit: 2021-09-16 | Discharge: 2021-09-16 | Disposition: A | Discharge status: Home / Self Care | Payer: PPO | Source: Ambulatory Visit | Attending: Physician Assistant | Admitting: Physician Assistant

## 2021-09-16 DIAGNOSIS — M549 Dorsalgia, unspecified: Secondary | ICD-10-CM | POA: Diagnosis not present

## 2021-09-16 DIAGNOSIS — M2578 Osteophyte, vertebrae: Secondary | ICD-10-CM | POA: Diagnosis not present

## 2021-09-16 DIAGNOSIS — M40204 Unspecified kyphosis, thoracic region: Secondary | ICD-10-CM | POA: Diagnosis not present

## 2021-09-16 DIAGNOSIS — M545 Low back pain, unspecified: Secondary | ICD-10-CM | POA: Diagnosis not present

## 2021-09-23 ENCOUNTER — Ambulatory Visit: Payer: PPO | Admitting: Physician Assistant

## 2021-09-23 ENCOUNTER — Encounter: Payer: Self-pay | Admitting: Physician Assistant

## 2021-09-23 DIAGNOSIS — M549 Dorsalgia, unspecified: Secondary | ICD-10-CM | POA: Diagnosis not present

## 2021-09-23 MED ORDER — TIZANIDINE HCL 2 MG PO CAPS: 2.0000 mg | ORAL_CAPSULE | Freq: Every evening | ORAL | 0 refills | Status: DC

## 2021-09-23 NOTE — Progress Notes (Signed)
HPI: [name] comes back in today to go over the MRI of her thoracic spine and lumbar spine.  She continues to have low back pain.  Unfortunately she has had a fall since she was last seen and states she was making the bed.  She fell on her side and now she is having some rib pain no shortness of breath.  Notes that the baclofen is making her dizzy. ?MRI thoracic lumbar spine performed 09/16/2021 showed compression deformity of T10 which is mild acute/subacute.  Subacute T8 11 compression fracture which is unchanged from MRI 2 months ago.  Mild acute/subacute endplate fracture at Z00 which is new from the MRI 2 months ago.  Mild compression fracture deformities L1 and L3.  Chronic compression deformities L2 L4 and L5.  Healed compression fractures T7-T8-T9. ? ?Review of systems: See HPI otherwise negative. ? ?General: Well-developed well nourished female no acute distress.  Walks with antalgic gait. ?Spine: thoracic spine tenderness over the spinal column lower thoracic to upper lumbar region. ? ?Impression: Compression fractures thoracic spine lumbar spine ? ?Plan: At this point time conservative measures only no high-impact activities.  In regards to her ribs would not recommend any treatment at this point time.  We will change her baclofen to Zanaflex 2 mg at night.  Follow-up with [place] as needed pain persist or becomes worse.  Questions were encouraged and answered at length.  Told her she can have pain in her thoracic and lumbar spine region for up to 3 months. ?

## 2021-09-24 ENCOUNTER — Telehealth: Payer: Self-pay | Admitting: Physician Assistant

## 2021-09-24 NOTE — Telephone Encounter (Signed)
Please advise 

## 2021-09-24 NOTE — Telephone Encounter (Signed)
Pt called. She states last night she did not sleep at all. Now she feels as if the pain has became constant. She also said the muscle relaxer's made her sick. She would like to know what she can do to  get some relief.  ? ?CB [phone]  ?

## 2021-09-25 ENCOUNTER — Other Ambulatory Visit: Payer: Self-pay | Admitting: Physician Assistant

## 2021-09-25 MED ORDER — TRAMADOL HCL 50 MG PO TABS: 50.0000 mg | ORAL_TABLET | Freq: Four times a day (QID) | ORAL | 0 refills | Status: AC | PRN

## 2021-09-25 MED ORDER — ONDANSETRON HCL 4 MG PO TABS: 4.0000 mg | ORAL_TABLET | Freq: Three times a day (TID) | ORAL | 0 refills | Status: AC | PRN

## 2021-09-25 NOTE — Telephone Encounter (Signed)
Tried calling pt. Phone kept ringing. Unable to reach ?

## 2021-09-25 NOTE — Telephone Encounter (Signed)
I called and talked to the pt. Can you please send in zofran and tramadol or tylenol # 3 for her? ?

## 2021-09-26 ENCOUNTER — Other Ambulatory Visit: Payer: Self-pay

## 2021-09-26 ENCOUNTER — Emergency Department (HOSPITAL_COMMUNITY): Payer: PPO

## 2021-09-26 ENCOUNTER — Encounter (HOSPITAL_COMMUNITY): Payer: Self-pay | Admitting: Emergency Medicine

## 2021-09-26 ENCOUNTER — Emergency Department (HOSPITAL_COMMUNITY)
Admission: EM | Admit: 2021-09-26 | Discharge: 2021-09-26 | Disposition: A | Discharge status: Home / Self Care | Payer: PPO | Attending: Emergency Medicine | Admitting: Emergency Medicine

## 2021-09-26 DIAGNOSIS — I10 Essential (primary) hypertension: Secondary | ICD-10-CM | POA: Diagnosis not present

## 2021-09-26 DIAGNOSIS — Z85528 Personal history of other malignant neoplasm of kidney: Secondary | ICD-10-CM | POA: Insufficient documentation

## 2021-09-26 DIAGNOSIS — W19XXXA Unspecified fall, initial encounter: Secondary | ICD-10-CM | POA: Insufficient documentation

## 2021-09-26 DIAGNOSIS — R0789 Other chest pain: Secondary | ICD-10-CM | POA: Diagnosis not present

## 2021-09-26 DIAGNOSIS — J449 Chronic obstructive pulmonary disease, unspecified: Secondary | ICD-10-CM | POA: Insufficient documentation

## 2021-09-26 DIAGNOSIS — M40204 Unspecified kyphosis, thoracic region: Secondary | ICD-10-CM | POA: Diagnosis not present

## 2021-09-26 DIAGNOSIS — S2242XA Multiple fractures of ribs, left side, initial encounter for closed fracture: Secondary | ICD-10-CM | POA: Diagnosis not present

## 2021-09-26 DIAGNOSIS — J439 Emphysema, unspecified: Secondary | ICD-10-CM | POA: Diagnosis not present

## 2021-09-26 DIAGNOSIS — Z79899 Other long term (current) drug therapy: Secondary | ICD-10-CM | POA: Insufficient documentation

## 2021-09-26 DIAGNOSIS — R9431 Abnormal electrocardiogram [ECG] [EKG]: Secondary | ICD-10-CM | POA: Diagnosis not present

## 2021-09-26 DIAGNOSIS — R079 Chest pain, unspecified: Secondary | ICD-10-CM | POA: Diagnosis not present

## 2021-09-26 DIAGNOSIS — S299XXA Unspecified injury of thorax, initial encounter: Secondary | ICD-10-CM | POA: Diagnosis present

## 2021-09-26 DIAGNOSIS — R000 Tachycardia, unspecified: Secondary | ICD-10-CM | POA: Diagnosis not present

## 2021-09-26 LAB — CBC
HCT: 43.7 % (ref 36.0–46.0)
Hemoglobin: 14.2 g/dL (ref 12.0–15.0)
MCH: 31.8 pg (ref 26.0–34.0)
MCHC: 32.5 g/dL (ref 30.0–36.0)
MCV: 97.8 fL (ref 80.0–100.0)
Platelets: 407 10*3/uL — ABNORMAL HIGH (ref 150–400)
RBC: 4.47 MIL/uL (ref 3.87–5.11)
RDW: 13.1 % (ref 11.5–15.5)
WBC: 10.3 10*3/uL (ref 4.0–10.5)
nRBC: 0 % (ref 0.0–0.2)

## 2021-09-26 LAB — BASIC METABOLIC PANEL
Anion gap: 11 (ref 5–15)
BUN: 9 mg/dL (ref 8–23)
CO2: 19 mmol/L — ABNORMAL LOW (ref 22–32)
Calcium: 9.3 mg/dL (ref 8.9–10.3)
Chloride: 102 mmol/L (ref 98–111)
Creatinine, Ser: 1.45 mg/dL — ABNORMAL HIGH (ref 0.44–1.00)
GFR, Estimated: 35 mL/min — ABNORMAL LOW (ref >60)
Glucose, Bld: 136 mg/dL — ABNORMAL HIGH (ref 70–99)
Potassium: 3.5 mmol/L (ref 3.5–5.1)
Sodium: 132 mmol/L — ABNORMAL LOW (ref 135–145)

## 2021-09-26 LAB — TROPONIN I (HIGH SENSITIVITY)
Troponin I (High Sensitivity): 12 ng/L (ref <18)
Troponin I (High Sensitivity): 12 ng/L (ref <18)

## 2021-09-26 LAB — D-DIMER, QUANTITATIVE: D-Dimer, Quant: 0.82 ug/mL-FEU — ABNORMAL HIGH (ref 0.00–0.50)

## 2021-09-26 MED ORDER — MORPHINE SULFATE (PF) 2 MG/ML IV SOLN
2.0000 mg | Freq: Once | INTRAVENOUS | Status: AC
  Administered 2021-09-26: 2 mg via INTRAVENOUS
  Filled 2021-09-26: qty 1

## 2021-09-26 MED ORDER — SODIUM CHLORIDE 0.9 % IV BOLUS
1000.0000 mL | Freq: Once | INTRAVENOUS | Status: AC
  Administered 2021-09-26: 1000 mL via INTRAVENOUS

## 2021-09-26 MED ORDER — CODEINE SULFATE 15 MG PO TABS
30.0000 mg | ORAL_TABLET | Freq: Once | ORAL | Status: AC
  Administered 2021-09-26: 30 mg via ORAL
  Filled 2021-09-26: qty 2

## 2021-09-26 MED ORDER — HYDROMORPHONE HCL 1 MG/ML IJ SOLN
0.5000 mg | Freq: Once | INTRAMUSCULAR | Status: AC
  Administered 2021-09-26: 0.5 mg via INTRAVENOUS
  Filled 2021-09-26: qty 1

## 2021-09-26 MED ORDER — LIDOCAINE 5 % EX PTCH
1.0000 | MEDICATED_PATCH | CUTANEOUS | Status: DC
  Administered 2021-09-26: 1 via TRANSDERMAL
  Filled 2021-09-26: qty 1

## 2021-09-26 MED ORDER — ACETAMINOPHEN 500 MG PO TABS
1000.0000 mg | ORAL_TABLET | Freq: Once | ORAL | Status: AC
Start: 2021-09-26 — End: 2021-09-26
  Administered 2021-09-26: 1000 mg via ORAL
  Filled 2021-09-26: qty 2

## 2021-09-26 NOTE — Discharge Instructions (Signed)
Incentive spirometer: Use frequently to prevent pneumonia.  ?Recommended usage: 5-10 deep inhalations through spirometer every 2-3 hours for 7-10 days. ? ?

## 2021-09-26 NOTE — ED Notes (Signed)
Pt had some pain with inspiration but pt tolerated incentive spirometry well - met goal at 1250 4x  ?

## 2021-09-26 NOTE — ED Provider Notes (Signed)
?[facility] ?Provider Note ? ?CSN: [id number] ?Arrival date & time: 09/26/21 0043 ? ?Chief Complaint(s) ?Chest Pain ? ?HPI ?Nicole Preston is a 88 y.o. female with a past medical history listed below including osteoporosis with multiple vertebral compression fractures here for left chest pain.  Patient reports she initially felt it 2 weeks ago when she had a mechanical fall.  Her pain has been constant since onset but had been stable, but she did need to call the PCP who prescribed her Tylenol 3 yesterday.  However she has not picked it up yet. Tonight while trying to get up out of a chair the pain became excruciatingly worse.  Pain is worse with movement of the left extremity and palpation of the left chest about mid rib area.  No central or precordial chest pain.  No shortness of breath though it does hurt to take a deep breath.  Due to the severity of the pain, EMS was called. ? ?EMS obtained an EKG showing left bundle branch block.  They gave patient's aspirin, nitroglycerin, and morphine. ? ?Initial blood pressure in triage shows systolics in the 09N.  Patient was brought immediately to a treatment room. ? ?The history is provided by the patient.  ? ?Past Medical History ?Past Medical History:  ?Diagnosis Date  ? Arthritis   ? COPD (chronic obstructive pulmonary disease) ([facility])   ? Hemorrhoids   ? Hypertension   ? Infection 02/2014  ? RT SHOULDER  ? Osteopetrosis   ? Osteoporosis   ? Renal cancer ([facility]) 4/12  ? rt nephrectomy  ? S/p nephrectomy   ? ?Patient Active Problem List  ? Diagnosis Date Noted  ? AKI (acute kidney injury) ([facility]) 05/02/2016  ? Acute cystitis 05/01/2016  ? Dizziness 05/01/2016  ? Colitis 04/30/2016  ? Benign essential HTN 04/05/2014  ? Superficial injury of shoulder with infection 03/21/2014  ? Colon polyp 01/02/2012  ? Hemorrhage of rectum and anus 12/09/2011  ? Renal cell carcinoma ([facility]) 12/09/2011  ? ?Home Medication(s) ?Prior to Admission medications    ?Medication Sig Start Date End Date Taking? Authorizing Provider  ?amLODipine (NORVASC) 5 MG tablet TK 1 T PO D 08/11/18   [provider]  ?benazepril (LOTENSIN) 10 MG tablet TK 1 T PO D 08/11/18   [provider]  ?benazepril (LOTENSIN) 20 MG tablet Take 20 mg by mouth daily.    [provider]  ?cholecalciferol (VITAMIN D) 1000 UNITS tablet Take 1,000 Units by mouth daily.     [provider]  ?gabapentin (NEURONTIN) 300 MG capsule Take 2 capsules (600 mg total) by mouth at bedtime. 08/26/21   Pete Pelt, PA-C  ?LYSINE PO Take 1 tablet by mouth daily as needed (mouth sores).     [provider]  ?ondansetron (ZOFRAN) 4 MG tablet Take 1 tablet (4 mg total) by mouth every 8 (eight) hours as needed for nausea or vomiting. 09/25/21   Pete Pelt, PA-C  ?tizanidine (ZANAFLEX) 2 MG capsule Take 1 capsule (2 mg total) by mouth at bedtime. 09/23/21   Pete Pelt, PA-C  ?traMADol (ULTRAM) 50 MG tablet Take 1 tablet (50 mg total) by mouth every 6 (six) hours as needed. 09/25/21   Pete Pelt, PA-C  ?                                                                                                                                  ?  Allergies ?Patient has no known allergies. ? ?Review of Systems ?Review of Systems ?As noted in HPI ? ?Physical Exam ?Vital Signs  ?I have reviewed the triage vital signs ?BP (!) 142/70   Pulse 74   Temp 97.6 ?F (36.4 ?C) (Oral)   Resp 13   Ht '5\' 4"'$  (1.626 m)   Wt 63 kg   SpO2 95%   BMI 23.84 kg/m?  ? ?Physical Exam ?Vitals reviewed.  ?Constitutional:   ?   General: She is not in acute distress. ?   Appearance: She is well-developed. She is not diaphoretic.  ?HENT:  ?   Head: Normocephalic and atraumatic.  ?   Nose: Nose normal.  ?Eyes:  ?   General: No scleral icterus.    ?   Right eye: No discharge.     ?   Left eye: No discharge.  ?   Conjunctiva/sclera: Conjunctivae normal.  ?   Pupils: Pupils are equal, round, and reactive to  light.  ?Cardiovascular:  ?   Rate and Rhythm: Normal rate and regular rhythm.  ?   Heart sounds: No murmur heard. ?  No friction rub. No gallop.  ?Pulmonary:  ?   Effort: Pulmonary effort is normal. No respiratory distress.  ?   Breath sounds: Normal breath sounds. No stridor. No rales.  ?Chest:  ?   Chest wall: Tenderness present.  ? ? ?Abdominal:  ?   General: There is no distension.  ?   Palpations: Abdomen is soft.  ?   Tenderness: There is no abdominal tenderness.  ?Musculoskeletal:     ?   General: No tenderness.  ?   Cervical back: Normal range of motion and neck supple.  ?Skin: ?   General: Skin is warm and dry.  ?   Findings: No erythema or rash.  ?Neurological:  ?   Mental Status: She is alert and oriented to person, place, and time.  ? ? ?ED Results and Treatments ?Labs ?(all labs ordered are listed, but only abnormal results are displayed) ?Labs Reviewed  ?BASIC METABOLIC PANEL - Abnormal; Notable for the following components:  ?    Result Value  ? Sodium 132 (*)   ? CO2 19 (*)   ? Glucose, Bld 136 (*)   ? Creatinine, Ser 1.45 (*)   ? GFR, Estimated 35 (*)   ? All other components within normal limits  ?CBC - Abnormal; Notable for the following components:  ? Platelets 407 (*)   ? All other components within normal limits  ?D-DIMER, QUANTITATIVE - Abnormal; Notable for the following components:  ? D-Dimer, Quant 0.82 (*)   ? All other components within normal limits  ?TROPONIN I (HIGH SENSITIVITY)  ?TROPONIN I (HIGH SENSITIVITY)  ?                                                                                                                       ?EKG ? EKG Interpretation ? ?Date/Time:  Thursday September 26 2021  00:58:08 EDT ?Ventricular Rate:  89 ?PR Interval:  194 ?QRS Duration: 136 ?QT Interval:  412 ?QTC Calculation: 501 ?R Axis:   -47 ?Text Interpretation: Sinus rhythm with Premature supraventricular complexes Left axis deviation Left bundle branch block Abnormal ECG When compared with ECG of  14-Mar-2014 10:25, PREVIOUS ECG IS PRESENT No significant change was found Confirmed by Addison Lank [id number]) on 09/26/2021 1:33:24 AM ?  ? ?  ? ?Radiology ?DG Ribs Unilateral W/Chest Left ? ?Result Date: 09/26/2021 ?CLINICAL DATA:  Fall with left-sided rib pain. EXAM: LEFT RIBS AND CHEST - 3+ VIEW COMPARISON:  Portable chest earlier today, chest CT with contrast 07/01/2018. FINDINGS: Generalized osteopenia. There are mildly displaced, recent appearing transverse fractures of the left anterolateral fifth and sixth ribs. No other displaced rib fractures are visualized. There is a stable mediastinal configuration with aortic tortuosity and atherosclerosis, old CABG change. There is mild cardiomegaly without evidence of CHF. The lungs show mild chronic changes. There is linear scarring or atelectasis in the left base. No focal pneumonia is seen or pneumothorax. Kyphoplasty cement is again noted at T6, 7 and 8. IMPRESSION: Mildly displaced fractures of the left anterolateral fifth and sixth ribs with recent appearance. No other acute chest findings. Osteopenia. Aortic atherosclerosis. Electronically Signed   By: Telford Nab M.D.   On: 09/26/2021 02:37  ? ?DG Thoracic Spine 2 View ? ?Result Date: 09/26/2021 ?CLINICAL DATA:  Fall injury. EXAM: THORACIC SPINE 2 VIEWS COMPARISON:  MRI 09/16/2021. FINDINGS: There is profound osteopenia. Thoracic kyphosis is again noted centered the mid to lower thoracic spine where there is kyphoplasty cement and mild-to-moderate wedging of T7, 8 and 9. Lesser compression deformities of the T10, T11 and L1 vertebrae are redemonstrated unchanged. No AP listhesis seen. The vertebra are normal in heights above T7. Surgical changes in the right upper abdomen are again shown. IMPRESSION: No new or worsening compression fracture is evident radiographically. Repeat MRI if there is continued concern. Osteoporosis. Electronically Signed   By: Telford Nab M.D.   On: 09/26/2021 02:42  ? ?DG Chest Port  1 View ? ?Result Date: 09/26/2021 ?CLINICAL DATA:  Chest pain EXAM: PORTABLE CHEST 1 VIEW COMPARISON:  CT chest 07/01/2018.  Chest 03/14/2014 FINDINGS: Postoperative changes in the mediastinum. Kyphoplasty changes in the m

## 2021-09-26 NOTE — ED Notes (Signed)
Pt denies CP at this time 

## 2021-09-26 NOTE — ED Notes (Signed)
Patient verbalizes understanding of discharge instructions. Opportunity for questioning and answers were provided. Armband removed by staff, pt discharged from ED via wheelchair.  

## 2021-09-26 NOTE — ED Notes (Signed)
Patient transported to X-ray 

## 2021-09-26 NOTE — ED Provider Triage Note (Signed)
Emergency Medicine Provider Triage Evaluation Note ? ?Nicole Preston , a 88 y.o. female  was evaluated in triage.  Pt complains of CP. ? ?Review of Systems  ?Positive: Cp, sob, lightheadedness, nausea ?Negative: Fever, productive cough, abd pain ? ?Physical Exam  ?BP (!) 75/53   Pulse 76   Resp 18   Ht '5\' 4"'$  (1.626 m)   Wt 63 kg   SpO2 91%   BMI 23.84 kg/m?  ?Gen:   Awake, no distress   ?Resp:  Normal effort  ?MSK:   Moves extremities without difficulty  ?Other:  L chest TTP ? ?Medical Decision Making  ?Medically screening exam initiated at 12:59 AM.  Appropriate orders placed.  Nicole Preston was informed that the remainder of the evaluation will be completed by another provider, this initial triage assessment does not replace that evaluation, and the importance of remaining in the ED until their evaluation is complete. ? ?Pt report she fell and broke her ribs in the left side several week ago.  Developed acute onset of Sharp stabbing L sided chest pain and SOB tonight, much worse than prior.  Found to be hypotensive, IVF started. Lungs sounds present ?  ?Domenic Moras, PA-C ?09/26/21 0101 ? ?

## 2021-09-26 NOTE — ED Notes (Signed)
Pt uncomfortable with movement and in lots of pain with movement- placed on purewick at this time  ?

## 2021-09-26 NOTE — ED Triage Notes (Signed)
Pt BIB EMS from home with c/o left sided chest pain and SHOB. Pt fell x 10 days ago with injury to left ribs, pt states that the pain is worse. Pt given 324 ASA, 0.4 Sl nitro x 1, and '4mg'$  morphine with EMS.  ? ?20 LAC ? ?132/84 ?100 HR  ?16 RR ?94 RA ?

## 2021-09-26 NOTE — ED Notes (Signed)
Pt brought to room at this time  ?

## 2021-10-01 ENCOUNTER — Telehealth (HOSPITAL_COMMUNITY): Payer: Self-pay

## 2021-10-01 NOTE — Telephone Encounter (Signed)
Called pt regarding new referral for KP. She said that she is in the process of getting in at a facility in [place] and also has some fractured ribs and a lot of pain from that. She does not want to schedule at this time. She would like to just see about letting this heal since she has so many other things going on at this time. AW ?

## 2021-10-02 DIAGNOSIS — I700 Atherosclerosis of aorta: Secondary | ICD-10-CM | POA: Diagnosis not present

## 2021-10-02 DIAGNOSIS — Z66 Do not resuscitate: Secondary | ICD-10-CM | POA: Diagnosis not present

## 2021-10-02 DIAGNOSIS — J449 Chronic obstructive pulmonary disease, unspecified: Secondary | ICD-10-CM | POA: Diagnosis not present

## 2021-10-02 DIAGNOSIS — I10 Essential (primary) hypertension: Secondary | ICD-10-CM | POA: Diagnosis not present

## 2021-10-22 DIAGNOSIS — Z905 Acquired absence of kidney: Secondary | ICD-10-CM | POA: Diagnosis not present

## 2021-10-22 DIAGNOSIS — I700 Atherosclerosis of aorta: Secondary | ICD-10-CM | POA: Diagnosis not present

## 2021-10-22 DIAGNOSIS — I129 Hypertensive chronic kidney disease with stage 1 through stage 4 chronic kidney disease, or unspecified chronic kidney disease: Secondary | ICD-10-CM | POA: Diagnosis not present

## 2021-10-22 DIAGNOSIS — E7800 Pure hypercholesterolemia, unspecified: Secondary | ICD-10-CM | POA: Diagnosis not present

## 2021-10-22 DIAGNOSIS — Z1339 Encounter for screening examination for other mental health and behavioral disorders: Secondary | ICD-10-CM | POA: Diagnosis not present

## 2021-10-22 DIAGNOSIS — Z1331 Encounter for screening for depression: Secondary | ICD-10-CM | POA: Diagnosis not present

## 2021-10-22 DIAGNOSIS — R809 Proteinuria, unspecified: Secondary | ICD-10-CM | POA: Diagnosis not present

## 2021-10-22 DIAGNOSIS — N1831 Chronic kidney disease, stage 3a: Secondary | ICD-10-CM | POA: Diagnosis not present

## 2021-10-22 DIAGNOSIS — M5136 Other intervertebral disc degeneration, lumbar region: Secondary | ICD-10-CM | POA: Diagnosis not present

## 2021-10-22 DIAGNOSIS — F419 Anxiety disorder, unspecified: Secondary | ICD-10-CM | POA: Diagnosis not present

## 2021-10-22 DIAGNOSIS — J449 Chronic obstructive pulmonary disease, unspecified: Secondary | ICD-10-CM | POA: Diagnosis not present

## 2021-10-22 DIAGNOSIS — J8410 Pulmonary fibrosis, unspecified: Secondary | ICD-10-CM | POA: Diagnosis not present

## 2021-10-28 ENCOUNTER — Other Ambulatory Visit: Payer: Self-pay | Admitting: Orthopaedic Surgery

## 2021-10-28 MED ORDER — BACLOFEN 10 MG PO TABS: 10.0000 mg | ORAL_TABLET | Freq: Three times a day (TID) | ORAL | 1 refills | Status: DC | PRN

## 2021-10-28 MED ORDER — METHOCARBAMOL 500 MG PO TABS: 500.0000 mg | ORAL_TABLET | Freq: Four times a day (QID) | ORAL | 1 refills | Status: AC | PRN

## 2021-10-29 ENCOUNTER — Telehealth: Payer: Self-pay | Admitting: Orthopaedic Surgery

## 2021-10-29 NOTE — Telephone Encounter (Signed)
Pt was called. She had picked up both the muscle relaxers . Pt was advised to just take one of them. She stated understanding  ?

## 2021-10-29 NOTE — Telephone Encounter (Signed)
Looks like you changed her muscle relaxer? Is this correct? ?

## 2021-10-29 NOTE — Telephone Encounter (Signed)
Pt called and states she is taking bachlofan '10mg'$  but I do not see it in pt chart. Lease call her about this confusion.  ? ?CB [phone]  ?

## 2021-12-19 DIAGNOSIS — R079 Chest pain, unspecified: Secondary | ICD-10-CM | POA: Diagnosis not present

## 2022-08-28 ENCOUNTER — Encounter: Payer: Self-pay | Admitting: Radiology

## 2023-05-04 ENCOUNTER — Encounter (HOSPITAL_COMMUNITY): Payer: Self-pay | Admitting: Emergency Medicine

## 2023-05-24 DEATH — deceased
